# Patient Record
Sex: Female | Born: 1970 | Race: Black or African American | Hispanic: No | Marital: Single | Smoking: Current some day smoker
Health system: Southern US, Community
[De-identification: ages and names within clinical notes are randomized; demographics above are authoritative.]

## PROBLEM LIST (undated history)

## (undated) DIAGNOSIS — K219 Gastro-esophageal reflux disease without esophagitis: Secondary | ICD-10-CM

## (undated) DIAGNOSIS — D649 Anemia, unspecified: Secondary | ICD-10-CM

## (undated) DIAGNOSIS — N83201 Unspecified ovarian cyst, right side: Secondary | ICD-10-CM

## (undated) DIAGNOSIS — K220 Achalasia of cardia: Secondary | ICD-10-CM

## (undated) DIAGNOSIS — R109 Unspecified abdominal pain: Secondary | ICD-10-CM

## (undated) DIAGNOSIS — E079 Disorder of thyroid, unspecified: Secondary | ICD-10-CM

## (undated) DIAGNOSIS — I10 Essential (primary) hypertension: Secondary | ICD-10-CM

## (undated) DIAGNOSIS — N83202 Unspecified ovarian cyst, left side: Secondary | ICD-10-CM

## (undated) HISTORY — PX: TUBAL LIGATION: SHX77

---

## 2013-12-11 ENCOUNTER — Emergency Department (HOSPITAL_COMMUNITY)
Admission: EM | Admit: 2013-12-11 | Discharge: 2013-12-11 | Disposition: A | Discharge status: Home / Self Care | Payer: Self-pay | Attending: Emergency Medicine | Admitting: Emergency Medicine

## 2013-12-11 ENCOUNTER — Encounter (HOSPITAL_COMMUNITY): Payer: Self-pay | Admitting: Emergency Medicine

## 2013-12-11 ENCOUNTER — Emergency Department (HOSPITAL_COMMUNITY): Payer: Self-pay

## 2013-12-11 DIAGNOSIS — Y929 Unspecified place or not applicable: Secondary | ICD-10-CM | POA: Insufficient documentation

## 2013-12-11 DIAGNOSIS — S79919A Unspecified injury of unspecified hip, initial encounter: Secondary | ICD-10-CM | POA: Insufficient documentation

## 2013-12-11 DIAGNOSIS — S46909A Unspecified injury of unspecified muscle, fascia and tendon at shoulder and upper arm level, unspecified arm, initial encounter: Secondary | ICD-10-CM | POA: Insufficient documentation

## 2013-12-11 DIAGNOSIS — Y9389 Activity, other specified: Secondary | ICD-10-CM | POA: Insufficient documentation

## 2013-12-11 DIAGNOSIS — S4980XA Other specified injuries of shoulder and upper arm, unspecified arm, initial encounter: Secondary | ICD-10-CM | POA: Insufficient documentation

## 2013-12-11 DIAGNOSIS — M25511 Pain in right shoulder: Secondary | ICD-10-CM

## 2013-12-11 DIAGNOSIS — S0993XA Unspecified injury of face, initial encounter: Secondary | ICD-10-CM | POA: Insufficient documentation

## 2013-12-11 DIAGNOSIS — R110 Nausea: Secondary | ICD-10-CM | POA: Insufficient documentation

## 2013-12-11 DIAGNOSIS — W108XXA Fall (on) (from) other stairs and steps, initial encounter: Secondary | ICD-10-CM | POA: Insufficient documentation

## 2013-12-11 DIAGNOSIS — S8990XA Unspecified injury of unspecified lower leg, initial encounter: Secondary | ICD-10-CM | POA: Insufficient documentation

## 2013-12-11 DIAGNOSIS — S0990XA Unspecified injury of head, initial encounter: Secondary | ICD-10-CM | POA: Insufficient documentation

## 2013-12-11 DIAGNOSIS — R0789 Other chest pain: Secondary | ICD-10-CM

## 2013-12-11 DIAGNOSIS — S298XXA Other specified injuries of thorax, initial encounter: Secondary | ICD-10-CM | POA: Insufficient documentation

## 2013-12-11 DIAGNOSIS — R0602 Shortness of breath: Secondary | ICD-10-CM | POA: Insufficient documentation

## 2013-12-11 DIAGNOSIS — F17200 Nicotine dependence, unspecified, uncomplicated: Secondary | ICD-10-CM | POA: Insufficient documentation

## 2013-12-11 DIAGNOSIS — W1809XA Striking against other object with subsequent fall, initial encounter: Secondary | ICD-10-CM | POA: Insufficient documentation

## 2013-12-11 MED ORDER — DIAZEPAM 5 MG PO TABS: 5.0000 mg | ORAL_TABLET | Freq: Three times a day (TID) | ORAL | Status: DC | PRN

## 2013-12-11 MED ORDER — ONDANSETRON 8 MG PO TBDP
8.0000 mg | ORAL_TABLET | Freq: Once | ORAL | Status: AC
  Administered 2013-12-11: 8 mg via ORAL

## 2013-12-11 MED ORDER — ONDANSETRON 8 MG PO TBDP
4.0000 mg | ORAL_TABLET | Freq: Once | ORAL | Status: DC
  Filled 2013-12-11: qty 1

## 2013-12-11 MED ORDER — OXYCODONE-ACETAMINOPHEN 5-325 MG PO TABS
1.0000 | ORAL_TABLET | Freq: Once | ORAL | Status: AC
  Administered 2013-12-11: 1 via ORAL
  Filled 2013-12-11: qty 1

## 2013-12-11 MED ORDER — HYDROCODONE-ACETAMINOPHEN 5-325 MG PO TABS: 1.0000 | ORAL_TABLET | ORAL | Status: DC | PRN

## 2013-12-11 NOTE — ED Notes (Signed)
Pt ambulates without distress. Pt alert x4 respirations easy

## 2013-12-11 NOTE — Progress Notes (Signed)
[facility] provided pt with a list of primary care resources and [facility] information. Patient state that she was in the process of getting insurance through [employer] [facility].

## 2013-12-11 NOTE — ED Provider Notes (Signed)
CSN: [id number]     Arrival date & time 12/11/13  0102 History   First MD Initiated Contact with Patient 12/11/13 1016     Chief Complaint  Patient presents with  . Fall   (Consider location/radiation/quality/duration/timing/severity/associated sxs/prior Treatment) HPI Patient presents with pain down her right side after falling down 10 stairs.  States she was turned around talking to a friend and slipped, causing her to tumble/roll down the stairs. C/O pain in her right neck, shoulder, right ribs, right hip, right knee.  States the worst pain is in her upper body.  She does have mild pain in her head.  States she did hit her head but denies LOC.  Denies confusion following the event.  Does currently have nausea and mild SOB.  Denies abdominal pain, vomiting, visual changes, any wounds or bleeding.    History reviewed. No pertinent past medical history. History reviewed. No pertinent past surgical history. History reviewed. No pertinent family history. History  Substance Use Topics  . Smoking status: Current Some Day Smoker  . Smokeless tobacco: Not on file  . Alcohol Use: No   OB History   Grav Para Term Preterm Abortions TAB SAB Ect Mult Living                 Review of Systems  Eyes: Negative for visual disturbance.  Respiratory: Positive for shortness of breath.   Cardiovascular: Positive for chest pain.  Gastrointestinal: Negative for abdominal pain.  Musculoskeletal: Positive for neck pain. Negative for back pain.  Skin: Negative for wound.  Allergic/Immunologic: Negative for immunocompromised state.  Neurological: Positive for headaches. Negative for dizziness, syncope, weakness, light-headedness and numbness.  Hematological: Does not bruise/bleed easily.  Psychiatric/Behavioral: Negative for confusion.    Allergies  Review of patient's allergies indicates not on file.  Home Medications  No current outpatient prescriptions on file. BP 155/94  Pulse 89  Temp(Src)  99.1 F (37.3 C) (Oral)  Resp 25  SpO2 99% Physical Exam  Nursing note and vitals reviewed. Constitutional: She appears well-developed and well-nourished. No distress.  HENT:  Head: Normocephalic and atraumatic.  Neck: Neck supple.  Cardiovascular: Normal rate and regular rhythm.   Pulmonary/Chest: Effort normal and breath sounds normal. No respiratory distress. She has no wheezes. She has no rales. She exhibits tenderness.    Abdominal: Soft. She exhibits no distension. There is no tenderness. There is no rebound and no guarding.  Musculoskeletal:       Right shoulder: She exhibits decreased range of motion and tenderness. She exhibits no deformity and normal pulse.       Right elbow: Normal.      Right wrist: Normal.       Right knee: Tenderness found.       Cervical back: She exhibits no bony tenderness.       Thoracic back: She exhibits no bony tenderness.       Lumbar back: She exhibits no bony tenderness.       Arms:      Right hand: Normal.  Spine nontender, no crepitus, or stepoffs. Extremities:  Strength 5/5, sensation intact, distal pulses intact.    Gait is limping   Neurological: She is alert. She has normal strength. She is not disoriented. No cranial nerve deficit or sensory deficit. She exhibits normal muscle tone. GCS eye subscore is 4. GCS verbal subscore is 5. GCS motor subscore is 6.  Finger to nose is normal.   Skin: She is not diaphoretic.  ED Course  Procedures (including critical care time) Labs Review Labs Reviewed - No data to display Imaging Review Dg Ribs Unilateral W/chest Right  12/11/2013   CLINICAL DATA:  Fall downstairs.  Right anterior rib pain.  EXAM: RIGHT RIBS AND CHEST - 3+ VIEW  COMPARISON:  None.  FINDINGS: There is no pneumothorax. Cardiopericardial silhouette appears within normal limits. No displaced rib fractures are identified.  IMPRESSION: Negative.   Electronically Signed   By: Andreas Newport M.D.   On: 12/11/2013 11:19   Dg  Shoulder Right  12/11/2013   CLINICAL DATA:  Fall down stairs.  Right shoulder pain.  EXAM: RIGHT SHOULDER - 2+ VIEW  COMPARISON:  None.  FINDINGS: The right shoulder is located.  Type 2 acromion.  No fracture.  IMPRESSION: Negative.   Electronically Signed   By: Andreas Newport M.D.   On: 12/11/2013 11:20   Dg Knee Complete 4 Views Right  12/11/2013   CLINICAL DATA:  Right knee pain.  Fall down steps.  EXAM: RIGHT KNEE - COMPLETE 4+ VIEW  COMPARISON:  None.  FINDINGS: There is no evidence of fracture, dislocation, or joint effusion. There is no evidence of arthropathy or other focal bone abnormality. Soft tissues are unremarkable.  IMPRESSION: Negative.   Electronically Signed   By: Andreas Newport M.D.   On: 12/11/2013 11:20    EKG Interpretation   None       MDM   1. Fall down stairs, initial encounter   2. Right-sided chest wall pain   3. Right shoulder pain      Pt with mechanical fall down stairs 1.5 hrs PTA. She is ambulatory and drove herself to the ED.  Pain along the right side of her body.  Xrays negative for fracture.  Pt did hit her head but has no physical e/o head trauma and no LOC, neurologic exam limited due to patient's pain in right arm but otherwise grossly intact.  Doubt intracranial bleed.  Pt rechecked 2+ hrs after arrival without any changes in mental status or exam.  D/C home with norco, valium, PCP resources for follow up. Discussed result, findings, treatment, and follow up  with patient.  Pt given return precautions.  Pt verbalizes understanding and agrees with plan.        Trixie Dredge, PA-C 12/11/13 1208

## 2013-12-11 NOTE — ED Provider Notes (Signed)
Medical screening examination/treatment/procedure(s) were performed by non-physician practitioner and as supervising physician I was immediately available for consultation/collaboration.  EKG Interpretation   None         Gilda Crease, MD 12/11/13 1212

## 2013-12-11 NOTE — ED Notes (Signed)
Pt states she fell down 10 stairs this am after missing step. Pt has R head, neck, ribs and knee pain. Pt ambulatory after fall. Pt able to move arm. No LOC

## 2013-12-25 ENCOUNTER — Emergency Department (HOSPITAL_COMMUNITY): Payer: Self-pay

## 2013-12-25 ENCOUNTER — Encounter (HOSPITAL_COMMUNITY): Payer: Self-pay | Admitting: Emergency Medicine

## 2013-12-25 ENCOUNTER — Emergency Department (HOSPITAL_COMMUNITY)
Admission: EM | Admit: 2013-12-25 | Discharge: 2013-12-25 | Disposition: A | Discharge status: Home / Self Care | Payer: Self-pay | Attending: Emergency Medicine | Admitting: Emergency Medicine

## 2013-12-25 DIAGNOSIS — G8911 Acute pain due to trauma: Secondary | ICD-10-CM | POA: Insufficient documentation

## 2013-12-25 DIAGNOSIS — R0781 Pleurodynia: Secondary | ICD-10-CM

## 2013-12-25 DIAGNOSIS — F17200 Nicotine dependence, unspecified, uncomplicated: Secondary | ICD-10-CM | POA: Insufficient documentation

## 2013-12-25 DIAGNOSIS — R079 Chest pain, unspecified: Secondary | ICD-10-CM | POA: Insufficient documentation

## 2013-12-25 MED ORDER — PREDNISONE 50 MG PO TABS: 50.0000 mg | ORAL_TABLET | Freq: Every day | ORAL | Status: DC

## 2013-12-25 MED ORDER — HYDROCODONE-ACETAMINOPHEN 5-325 MG PO TABS: 1.0000 | ORAL_TABLET | ORAL | Status: DC | PRN

## 2013-12-25 MED ORDER — OXYCODONE-ACETAMINOPHEN 5-325 MG PO TABS
1.0000 | ORAL_TABLET | Freq: Once | ORAL | Status: AC
  Administered 2013-12-25: 1 via ORAL
  Filled 2013-12-25: qty 1

## 2013-12-25 MED ORDER — MORPHINE SULFATE 4 MG/ML IJ SOLN
6.0000 mg | Freq: Once | INTRAMUSCULAR | Status: AC
  Administered 2013-12-25: 6 mg via INTRAMUSCULAR
  Filled 2013-12-25: qty 2

## 2013-12-25 MED ORDER — OXYCODONE-ACETAMINOPHEN 5-325 MG PO TABS: 1.0000 | ORAL_TABLET | Freq: Four times a day (QID) | ORAL | Status: DC | PRN

## 2013-12-25 MED ORDER — ONDANSETRON 8 MG PO TBDP
8.0000 mg | ORAL_TABLET | Freq: Once | ORAL | Status: AC
  Administered 2013-12-25: 8 mg via ORAL
  Filled 2013-12-25: qty 1

## 2013-12-25 NOTE — ED Notes (Signed)
Pt c/o R rib R hip pain onset 12/18, pt states she fell down steps while at work 2 weeks ago. Pt states pain became worse tonight. Pt grimacing and grunting with pain

## 2013-12-25 NOTE — Discharge Instructions (Signed)
Take vicodin as prescribed for severe pain.  Do not drive within four hours of taking this medication (may cause drowsiness or confusion).    Ice 2-3 times a day for 15-20 minutes.  Avoid activities that aggravate pain.   Use incentive spirometer once an hour while your awake until the pain improves.   Return to the ER if your pain worsens or you develop difficulty breathing.

## 2013-12-25 NOTE — ED Provider Notes (Signed)
CSN: [id number]     Arrival date & time 12/25/13  0358 History   First MD Initiated Contact with Patient 12/25/13 [phone]     Chief Complaint  Patient presents with  . Rib Injury   (Consider location/radiation/quality/duration/timing/severity/associated sxs/prior Treatment) HPI History provided by pt.   Pt had a mechanical fall on the stairs 2 weeks ago and presented to the ED w/ right anterior rib pain as well as several other injuries.  Unilateral ribs w/ chest imaging unremarkable at that time.  Returns to the ED today because pain has gradually worsened since that time.  Pain is mild at rest and aggravated by movement and deep inspiration.  She was sitting on the cough this morning, made a quick movement and pain became sharp and severe.  No associated fever, cough, SOB, abd pain, N/V.  Has not had relief w/ ibuprofen.   Marland Kitchen.History reviewed. No pertinent past medical history. History reviewed. No pertinent past surgical history. No family history on file. History  Substance Use Topics  . Smoking status: Current Some Day Smoker  . Smokeless tobacco: Not on file  . Alcohol Use: No   OB History   Grav Para Term Preterm Abortions TAB SAB Ect Mult Living                 Review of Systems  All other systems reviewed and are negative.    Allergies  Bee venom; Sulfa antibiotics; and Naproxen  Home Medications   Current Outpatient Rx  Name  Route  Sig  Dispense  Refill  . diazepam (VALIUM) 5 MG tablet   Oral   Take 1 tablet (5 mg total) by mouth 3 (three) times daily as needed (muscle spasm or pain).   12 tablet   0   . HYDROcodone-acetaminophen (NORCO/VICODIN) 5-325 MG per tablet   Oral   Take 1 tablet by mouth every 4 (four) hours as needed for moderate pain or severe pain.   15 tablet   0   . ibuprofen (ADVIL,MOTRIN) 200 MG tablet   Oral   Take 400-800 mg by mouth every 6 (six) hours as needed (pain).         Marland Kitchen. HYDROcodone-acetaminophen (NORCO/VICODIN) 5-325 MG per  tablet   Oral   Take 1 tablet by mouth every 4 (four) hours as needed for moderate pain.   20 tablet   0    BP 152/104  Pulse 87  Temp(Src) 98.8 F (37.1 C) (Oral)  Resp 19  Ht 5\' 1"  (1.549 m)  Wt 145 lb (65.772 kg)  BMI 27.41 kg/m2  SpO2 100%  LMP 12/18/2013 Physical Exam  Nursing note and vitals reviewed. Constitutional: She is oriented to person, place, and time. She appears well-developed and well-nourished. No distress.  Pt appears uncomfortable w/ movement  HENT:  Head: Normocephalic and atraumatic.  Eyes:  Normal appearance  Neck: Normal range of motion.  Cardiovascular: Normal rate and regular rhythm.   Pulmonary/Chest: Effort normal and breath sounds normal. No respiratory distress.  Pt is holding her right lower anterior chest.  No edema or ecchymosis.   ttp w/ guarding.  Takes shallow respirations.  Abdominal: Soft. Bowel sounds are normal. She exhibits no distension.  Diffuse ttp w/ guarding   Musculoskeletal: Normal range of motion.  Neurological: She is alert and oriented to person, place, and time.  Skin: Skin is warm and dry. No rash noted.  Psychiatric: She has a normal mood and affect. Her behavior is normal.  ED Course  Procedures (including critical care time) Labs Review Labs Reviewed - No data to display Imaging Review No results found.  EKG Interpretation   None       MDM   1. Rib pain on right side    Healhty 43yo F presents w/ gradually worsening right anterior rib pain s/p mechanical fall on stairs 2 weeks ago. Unilateral ribs w/ chest imaging negative on 12/07/13.  Pt appears uncomfortable, no visible injury to chest, rib ttp on exam.  Repeat xray pending.  Lawyer, PA-C to resume care.  Pt has received percocet for pain.     Otilio Miu, PA-C 12/25/13 [phone]

## 2013-12-25 NOTE — ED Provider Notes (Signed)
Medical screening examination/treatment/procedure(s) were performed by non-physician practitioner and as supervising physician I was immediately available for consultation/collaboration.   Denaya Horn, MD 12/25/13 0811 

## 2014-01-11 ENCOUNTER — Emergency Department (HOSPITAL_COMMUNITY): Payer: Self-pay

## 2014-01-11 ENCOUNTER — Emergency Department (HOSPITAL_COMMUNITY)
Admission: EM | Admit: 2014-01-11 | Discharge: 2014-01-11 | Disposition: A | Discharge status: Home / Self Care | Payer: Self-pay | Attending: Emergency Medicine | Admitting: Emergency Medicine

## 2014-01-11 DIAGNOSIS — R0602 Shortness of breath: Secondary | ICD-10-CM | POA: Insufficient documentation

## 2014-01-11 DIAGNOSIS — Y939 Activity, unspecified: Secondary | ICD-10-CM | POA: Insufficient documentation

## 2014-01-11 DIAGNOSIS — S20219A Contusion of unspecified front wall of thorax, initial encounter: Secondary | ICD-10-CM | POA: Insufficient documentation

## 2014-01-11 DIAGNOSIS — IMO0002 Reserved for concepts with insufficient information to code with codable children: Secondary | ICD-10-CM | POA: Insufficient documentation

## 2014-01-11 DIAGNOSIS — F17200 Nicotine dependence, unspecified, uncomplicated: Secondary | ICD-10-CM | POA: Insufficient documentation

## 2014-01-11 DIAGNOSIS — Y929 Unspecified place or not applicable: Secondary | ICD-10-CM | POA: Insufficient documentation

## 2014-01-11 MED ORDER — MORPHINE SULFATE 4 MG/ML IJ SOLN
4.0000 mg | Freq: Once | INTRAMUSCULAR | Status: AC
  Administered 2014-01-11: 4 mg via INTRAVENOUS
  Filled 2014-01-11: qty 1

## 2014-01-11 MED ORDER — HYDROCODONE-ACETAMINOPHEN 5-325 MG PO TABS: 1.0000 | ORAL_TABLET | ORAL | Status: DC | PRN
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
## 2014-01-11 NOTE — Discharge Instructions (Signed)
Take vicodin as prescribed for severe pain.  Do not drive within four hours of taking this medication (may cause drowsiness or confusion).   Take ibuprofen as well; up to 800mg  three times a day with food.   Use incentive spirometer once an hour while your awake until the pain improves.   Return to the ER if you pain worsens or you develop increased difficulty breathing.
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
## 2014-01-11 NOTE — ED Notes (Signed)
Patient transported to X-ray 

## 2014-01-11 NOTE — ED Notes (Signed)
Gave pt incentive spirometer and instructed pt how to use it and the importance on why to use it.

## 2014-01-12 NOTE — ED Provider Notes (Signed)
See separate note, I agree with plan by PA.  Enid SkeensZAVITZ, Ameena Vesey M   Enid SkeensJoshua M Cornesha Radziewicz, MD 01/12/14 [phone]

## 2014-01-23 ENCOUNTER — Emergency Department (HOSPITAL_COMMUNITY): Payer: Self-pay

## 2014-01-23 ENCOUNTER — Inpatient Hospital Stay (HOSPITAL_COMMUNITY)
Admission: EM | Admit: 2014-01-23 | Discharge: 2014-01-27 | DRG: 758 | Disposition: A | Discharge status: Home / Self Care | Payer: Self-pay | Attending: Internal Medicine | Admitting: Internal Medicine

## 2014-01-23 ENCOUNTER — Encounter (HOSPITAL_COMMUNITY): Payer: Self-pay | Admitting: Emergency Medicine

## 2014-01-23 DIAGNOSIS — N920 Excessive and frequent menstruation with regular cycle: Secondary | ICD-10-CM | POA: Diagnosis present

## 2014-01-23 DIAGNOSIS — Z888 Allergy status to other drugs, medicaments and biological substances status: Secondary | ICD-10-CM

## 2014-01-23 DIAGNOSIS — N739 Female pelvic inflammatory disease, unspecified: Principal | ICD-10-CM | POA: Diagnosis present

## 2014-01-23 DIAGNOSIS — Z833 Family history of diabetes mellitus: Secondary | ICD-10-CM

## 2014-01-23 DIAGNOSIS — E876 Hypokalemia: Secondary | ICD-10-CM | POA: Diagnosis present

## 2014-01-23 DIAGNOSIS — E872 Acidosis, unspecified: Secondary | ICD-10-CM | POA: Diagnosis present

## 2014-01-23 DIAGNOSIS — N83209 Unspecified ovarian cyst, unspecified side: Secondary | ICD-10-CM | POA: Diagnosis present

## 2014-01-23 DIAGNOSIS — E039 Hypothyroidism, unspecified: Secondary | ICD-10-CM

## 2014-01-23 DIAGNOSIS — Z882 Allergy status to sulfonamides status: Secondary | ICD-10-CM

## 2014-01-23 DIAGNOSIS — K219 Gastro-esophageal reflux disease without esophagitis: Secondary | ICD-10-CM | POA: Diagnosis present

## 2014-01-23 DIAGNOSIS — Z91038 Other insect allergy status: Secondary | ICD-10-CM

## 2014-01-23 DIAGNOSIS — E860 Dehydration: Secondary | ICD-10-CM

## 2014-01-23 DIAGNOSIS — R112 Nausea with vomiting, unspecified: Secondary | ICD-10-CM

## 2014-01-23 DIAGNOSIS — F17200 Nicotine dependence, unspecified, uncomplicated: Secondary | ICD-10-CM | POA: Diagnosis present

## 2014-01-23 DIAGNOSIS — R109 Unspecified abdominal pain: Secondary | ICD-10-CM

## 2014-01-23 DIAGNOSIS — D649 Anemia, unspecified: Secondary | ICD-10-CM | POA: Diagnosis present

## 2014-01-23 DIAGNOSIS — N730 Acute parametritis and pelvic cellulitis: Secondary | ICD-10-CM | POA: Diagnosis present

## 2014-01-23 DIAGNOSIS — D509 Iron deficiency anemia, unspecified: Secondary | ICD-10-CM | POA: Diagnosis present

## 2014-01-23 LAB — COMPREHENSIVE METABOLIC PANEL
ALBUMIN: 4 g/dL (ref 3.5–5.2)
ALT: 13 U/L (ref 0–35)
AST: 22 U/L (ref 0–37)
Alkaline Phosphatase: 78 U/L (ref 39–117)
BUN: 11 mg/dL (ref 6–23)
CALCIUM: 9.3 mg/dL (ref 8.4–10.5)
CO2: 16 mEq/L — ABNORMAL LOW (ref 19–32)
CREATININE: 0.71 mg/dL (ref 0.50–1.10)
Chloride: 102 mEq/L (ref 96–112)
GFR calc non Af Amer: >90 mL/min (ref >90)
GLUCOSE: 137 mg/dL — AB (ref 70–99)
Potassium: 3.6 mEq/L — ABNORMAL LOW (ref 3.7–5.3)
Sodium: 139 mEq/L (ref 137–147)
TOTAL PROTEIN: 7.8 g/dL (ref 6.0–8.3)
Total Bilirubin: 0.4 mg/dL (ref 0.3–1.2)

## 2014-01-23 LAB — RETICULOCYTES
RBC.: 3.85 MIL/uL — ABNORMAL LOW (ref 3.87–5.11)
Retic Count, Absolute: 42.4 10*3/uL (ref 19.0–186.0)
Retic Ct Pct: 1.1 % (ref 0.4–3.1)

## 2014-01-23 LAB — CBC WITH DIFFERENTIAL/PLATELET
BASOS PCT: 0 % (ref 0–1)
BASOS PCT: 0 % (ref 0–1)
Basophils Absolute: 0 10*3/uL (ref 0.0–0.1)
Basophils Absolute: 0 10*3/uL (ref 0.0–0.1)
EOS PCT: 1 % (ref 0–5)
Eosinophils Absolute: 0.1 10*3/uL (ref 0.0–0.7)
Eosinophils Absolute: 0 10*3/uL (ref 0.0–0.7)
Eosinophils Relative: 0 % (ref 0–5)
HCT: 35.9 % — ABNORMAL LOW (ref 36.0–46.0)
HEMATOCRIT: 31.6 % — AB (ref 36.0–46.0)
HEMOGLOBIN: 11.7 g/dL — AB (ref 12.0–15.0)
Hemoglobin: 10 g/dL — ABNORMAL LOW (ref 12.0–15.0)
LYMPHS ABS: 2.5 10*3/uL (ref 0.7–4.0)
LYMPHS PCT: 13 % (ref 12–46)
Lymphocytes Relative: 18 % (ref 12–46)
Lymphs Abs: 1.5 10*3/uL (ref 0.7–4.0)
MCH: 25.3 pg — ABNORMAL LOW (ref 26.0–34.0)
MCH: 24.9 pg — ABNORMAL LOW (ref 26.0–34.0)
MCHC: 32.6 g/dL (ref 30.0–36.0)
MCHC: 31.6 g/dL (ref 30.0–36.0)
MCV: 77.7 fL — AB (ref 78.0–100.0)
MCV: 78.8 fL (ref 78.0–100.0)
MONO ABS: 0.4 10*3/uL (ref 0.1–1.0)
MONOS PCT: 5 % (ref 3–12)
Monocytes Absolute: 0.7 10*3/uL (ref 0.1–1.0)
Monocytes Relative: 3 % (ref 3–12)
NEUTROS PCT: 77 % (ref 43–77)
NEUTROS PCT: 83 % — AB (ref 43–77)
Neutro Abs: 11 10*3/uL — ABNORMAL HIGH (ref 1.7–7.7)
Neutro Abs: 9.7 10*3/uL — ABNORMAL HIGH (ref 1.7–7.7)
PLATELETS: 265 10*3/uL (ref 150–400)
Platelets: 325 10*3/uL (ref 150–400)
RBC: 4.62 MIL/uL (ref 3.87–5.11)
RBC: 4.01 MIL/uL (ref 3.87–5.11)
RDW: 17.6 % — ABNORMAL HIGH (ref 11.5–15.5)
RDW: 17.6 % — AB (ref 11.5–15.5)
WBC: 14.3 10*3/uL — ABNORMAL HIGH (ref 4.0–10.5)
WBC: 11.7 10*3/uL — AB (ref 4.0–10.5)

## 2014-01-23 LAB — PROTIME-INR
INR: 1.07 (ref 0.00–1.49)
Prothrombin Time: 13.7 seconds (ref 11.6–15.2)

## 2014-01-23 LAB — URINALYSIS, ROUTINE W REFLEX MICROSCOPIC
Bilirubin Urine: NEGATIVE
GLUCOSE, UA: NEGATIVE mg/dL
Hgb urine dipstick: NEGATIVE
Ketones, ur: 40 mg/dL — AB
LEUKOCYTES UA: NEGATIVE
Nitrite: NEGATIVE
PH: 5.5 (ref 5.0–8.0)
Protein, ur: NEGATIVE mg/dL
Specific Gravity, Urine: 1.021 (ref 1.005–1.030)
Urobilinogen, UA: 0.2 mg/dL (ref 0.0–1.0)

## 2014-01-23 LAB — IRON AND TIBC
IRON: 24 ug/dL — AB (ref 42–135)
Saturation Ratios: 6 % — ABNORMAL LOW (ref 20–55)
TIBC: 430 ug/dL (ref 250–470)
UIBC: 406 ug/dL — AB (ref 125–400)

## 2014-01-23 LAB — VITAMIN B12: Vitamin B-12: 659 pg/mL (ref 211–911)

## 2014-01-23 LAB — TSH: TSH: 20.225 u[IU]/mL — ABNORMAL HIGH (ref 0.350–4.500)

## 2014-01-23 LAB — POCT PREGNANCY, URINE: PREG TEST UR: NEGATIVE

## 2014-01-23 LAB — WET PREP, GENITAL
Clue Cells Wet Prep HPF POC: NONE SEEN
TRICH WET PREP: NONE SEEN
WBC WET PREP: NONE SEEN
YEAST WET PREP: NONE SEEN

## 2014-01-23 LAB — FOLATE: Folate: 15.2 ng/mL

## 2014-01-23 LAB — FERRITIN: FERRITIN: 20 ng/mL (ref 10–291)

## 2014-01-23 LAB — CG4 I-STAT (LACTIC ACID): LACTIC ACID, VENOUS: 2.29 mmol/L — AB (ref 0.5–2.2)

## 2014-01-23 LAB — GC/CHLAMYDIA PROBE AMP
CT Probe RNA: NEGATIVE
GC Probe RNA: NEGATIVE

## 2014-01-23 LAB — T4, FREE: Free T4: 0.66 ng/dL — ABNORMAL LOW (ref 0.80–1.80)

## 2014-01-23 LAB — LIPASE, BLOOD: LIPASE: 29 U/L (ref 11–59)

## 2014-01-23 MED ORDER — HYDROMORPHONE HCL PF 1 MG/ML IJ SOLN
1.0000 mg | INTRAMUSCULAR | Status: DC | PRN
  Administered 2014-01-23 (×2): 1 mg via INTRAVENOUS
  Filled 2014-01-23 (×2): qty 1

## 2014-01-23 MED ORDER — HYDROMORPHONE HCL PF 1 MG/ML IJ SOLN
1.0000 mg | Freq: Once | INTRAMUSCULAR | Status: AC
  Administered 2014-01-23: 1 mg via INTRAVENOUS
  Filled 2014-01-23: qty 1

## 2014-01-23 MED ORDER — DEXTROSE 5 % IV SOLN
1.0000 g | Freq: Once | INTRAVENOUS | Status: AC
  Administered 2014-01-23: 1 g via INTRAVENOUS
  Filled 2014-01-23: qty 10

## 2014-01-23 MED ORDER — ONDANSETRON HCL 4 MG/2ML IJ SOLN
4.0000 mg | Freq: Four times a day (QID) | INTRAMUSCULAR | Status: DC | PRN
  Administered 2014-01-23: 4 mg via INTRAVENOUS
  Filled 2014-01-23: qty 2

## 2014-01-23 MED ORDER — ONDANSETRON HCL 4 MG/2ML IJ SOLN
4.0000 mg | Freq: Once | INTRAMUSCULAR | Status: AC
  Administered 2014-01-23: 4 mg via INTRAVENOUS
  Filled 2014-01-23: qty 2

## 2014-01-23 MED ORDER — SODIUM CHLORIDE 0.9 % IV BOLUS (SEPSIS)
1000.0000 mL | Freq: Once | INTRAVENOUS | Status: AC
  Administered 2014-01-23: 1000 mL via INTRAVENOUS

## 2014-01-23 MED ORDER — DEXTROSE-NACL 5-0.9 % IV SOLN
INTRAVENOUS | Status: DC
  Administered 2014-01-23 – 2014-01-26 (×5): via INTRAVENOUS

## 2014-01-23 MED ORDER — HEPARIN SODIUM (PORCINE) 5000 UNIT/ML IJ SOLN
5000.0000 [IU] | Freq: Three times a day (TID) | INTRAMUSCULAR | Status: DC
Start: 2014-01-23 — End: 2014-01-23

## 2014-01-23 MED ORDER — DOXYCYCLINE HYCLATE 100 MG IV SOLR
100.0000 mg | Freq: Two times a day (BID) | INTRAVENOUS | Status: DC
  Administered 2014-01-23 – 2014-01-25 (×6): 100 mg via INTRAVENOUS
  Filled 2014-01-23 (×8): qty 100

## 2014-01-23 MED ORDER — ONDANSETRON HCL 4 MG/2ML IJ SOLN: 4.0000 mg | Freq: Four times a day (QID) | INTRAMUSCULAR | Status: AC | PRN

## 2014-01-23 MED ORDER — MORPHINE SULFATE 2 MG/ML IJ SOLN
2.0000 mg | INTRAMUSCULAR | Status: DC | PRN
  Administered 2014-01-23 – 2014-01-26 (×32): 2 mg via INTRAVENOUS
  Filled 2014-01-23 (×32): qty 1

## 2014-01-23 MED ORDER — HEPARIN SODIUM (PORCINE) 5000 UNIT/ML IJ SOLN
5000.0000 [IU] | Freq: Three times a day (TID) | INTRAMUSCULAR | Status: DC
  Administered 2014-01-23 – 2014-01-27 (×12): 5000 [IU] via SUBCUTANEOUS
  Filled 2014-01-23 (×17): qty 1

## 2014-01-23 MED ORDER — HYDROMORPHONE HCL PF 1 MG/ML IJ SOLN
0.5000 mg | Freq: Once | INTRAMUSCULAR | Status: AC
  Administered 2014-01-23: 0.5 mg via INTRAVENOUS
  Filled 2014-01-23: qty 1

## 2014-01-23 MED ORDER — POTASSIUM CHLORIDE 10 MEQ/100ML IV SOLN
10.0000 meq | INTRAVENOUS | Status: AC
  Administered 2014-01-23 (×2): 10 meq via INTRAVENOUS
  Filled 2014-01-23 (×2): qty 100

## 2014-01-23 MED ORDER — PANTOPRAZOLE SODIUM 40 MG IV SOLR
40.0000 mg | INTRAVENOUS | Status: DC
  Administered 2014-01-23 – 2014-01-25 (×3): 40 mg via INTRAVENOUS
  Filled 2014-01-23 (×5): qty 40

## 2014-01-23 MED ORDER — DEXTROSE 5 % IV SOLN
1.0000 g | Freq: Three times a day (TID) | INTRAVENOUS | Status: DC
  Administered 2014-01-23 – 2014-01-25 (×7): 1 g via INTRAVENOUS
  Filled 2014-01-23 (×9): qty 1

## 2014-01-23 NOTE — ED Notes (Signed)
Pt attempted to give urine sample but was unable to provide at this time. Pt started on a bolus due to nausea.

## 2014-01-23 NOTE — H&P (Signed)
Hospital Admission Note Date: 01/23/2014  Patient name: Annette Anderson Medical record number: [id number] Date of birth: [birth year] Age: 43 y.o. Gender: female PCP: No PCP Per Patient  Medical Service: IMTS  Attending physician: Dr. Meredith Pel  Internal Medicine Teaching Service Contact Information  Weekday Hours (7AM-5PM):   1st Contact:  Roxy Cedar (Medical Student): Pgr: [phone]  1st contact:  Dr. Otis Brace       pgr:  [phone]  2nd contact:  Dr. Darden Palmer    pgr:  [phone]  ** If no return call within 15 minutes (after trying both pagers listed above), please call after hours pagers.   After 5 pm or weekends: 1st Contact: Pager: [phone] 2nd Contact: Pager: [phone]   Chief Complaint: Abdominal pain  History of Present Illness:  Annette Anderson is an 43 y.o. female with past medical history of achalasia (S/P of surgery) and anemia, who presents with abdominal pain, nausea, vomiting and diarrhea.  Patient reports that she started having sudden onset abdominal pain when she was driving a car 2 days ago. The abdominal pain is mainly located at right lower quadrant, sharp, constant, radiating to the back, 10 out of 10 in severity. It is associated with low grade fever, nausea and vomiting. She vomited twice without blood in the vomitus. She also had diarrhea on the day when her abdominal pain started. She had at least 10 bowel movements with watery stool on the first day. She took Imodium twice, her diarrhea has almost resolved on the second day. Currently she does not have diarrhea, but still has nausea and abdominal pain. Patient took ibuprofen q4h without help. She also reports having dysuria and increased urinary frequency, but no burning sensation on urination. Patient is sexually active with her fianc, reports using condom. She has chronic menstrual period induced abdominal pain, which she thinks is completely different with this pain. Patient has regular, but heavy  menstrual period . No vaginal discharge or dyspareunia.   Patient was found to have negative UA in ED. CT-abd without contrast showed no hydronephrosis, kidney stone, diverticulitis or colitis, appendix did not visualize well, with some right adnexal stranding and some small amount of free fluid, no free air or lymphadenopathy.  Trans abdominal and transvaginal US showed nonspecific soft tissue prominence around the right ovary suggesting inflammation or infection. GYN and surgery were consulted by ED. GYN suspects PID w/ CMT. Surgeon thinks that it is likely diagnosis of ruptured hemorrhagic ovarian cyst.  ROS:  Has fever, nausea, abdominal pain and dysuria; No fatigue, headaches, cough, chest pain, SOB, constipation, hematuria, joint pain or leg swelling.  Meds: Current Outpatient Rx  Name  Route  Sig  Dispense  Refill  . Cyanocobalamin (VITAMIN B 12 PO)   Oral   Take 1 tablet by mouth daily.         Marland Kitchen HYDROcodone-acetaminophen (NORCO/VICODIN) 5-325 MG per tablet   Oral   Take 1 tablet by mouth every 4 (four) hours as needed for moderate pain.   12 tablet   0   . ibuprofen (ADVIL,MOTRIN) 200 MG tablet   Oral   Take 400-800 mg by mouth every 6 (six) hours as needed for moderate pain.            Allergies: Allergies as of 01/23/2014 - Review Complete 01/23/2014  Allergen Reaction Noted  . Bee venom Anaphylaxis 12/11/2013  . Sulfa antibiotics Anaphylaxis and Hives 12/11/2013  . Naproxen Hives 12/11/2013   History reviewed. No pertinent past medical history.  Past Surgical History  Procedure Laterality Date  . Cesarean section     History reviewed. No pertinent family history. History   Social History  . Marital Status: Single    Spouse Name: N/A    Number of Children: N/A  . Years of Education: N/A   Occupational History  . Not on file.   Social History Main Topics  . Smoking status: Current Some Day Smoker  . Smokeless tobacco: Not on file  . Alcohol Use: Yes   . Drug Use: No  . Sexual Activity: Not on file   Other Topics Concern  . Not on file   Social History Narrative  . No narrative on file    Review of Systems: Full 14-point review of systems otherwise negative except as noted above in HPI. Physical Exam:   Filed Vitals:   01/23/14 1030 01/23/14 1045 01/23/14 1100 01/23/14 1115  BP: 116/72 129/66 133/80 122/74  Pulse: 87 87 92 95  Temp:      TempSrc:      Resp:      SpO2: 99% 99% 99% 100%    General: Not in acute distress HEENT: PERRL, EOMI, no scleral icterus, No JVD or bruit Cardiac: S1/S2, RRR, No murmurs, gallops or rubs Pulm: Good air movement bilaterally. Clear to auscultation bilaterally. No rales, wheezing, rhonchi or rubs. Abd: Tender diffusely, worse on the RLQ, there is guarding, No rebound pain, no organomegaly, BS present. No CVA tenderness (when tapped over R CVA, patient had abdomen pain).  Ext: No edema. 2+DP/PT pulse bilaterally Musculoskeletal: No joint deformities, erythema, or stiffness, ROM full Skin: No rashes.  Neuro: Alert and oriented X3, cranial nerves II-XII grossly intact, muscle strength 5/5 in all extremeties, sensation to light touch intact. Brachial reflex 2+ bilaterally. Knee reflex 1+ bilaterally. Negative Babinski's sign. Normal finger to nose test. Psych: Patient is not psychotic, no suicidal or hemocidal ideation.  Lab results: Basic Metabolic Panel:  Recent Labs  03/47/4201/30/15 0419  NA 139  K 3.6*  CL 102  CO2 16*  GLUCOSE 137*  BUN 11  CREATININE 0.71  CALCIUM 9.3   Liver Function Tests:  Recent Labs  01/23/14 0419  AST 22  ALT 13  ALKPHOS 78  BILITOT 0.4  PROT 7.8  ALBUMIN 4.0   No results found for this basename: LIPASE, AMYLASE,  in the last 72 hours No results found for this basename: AMMONIA,  in the last 72 hours CBC:  Recent Labs  01/23/14 0419 01/23/14 1059  WBC 14.3* 11.7*  NEUTROABS 11.0* 9.7*  HGB 11.7* 10.0*  HCT 35.9* 31.6*  MCV 77.7* 78.8  PLT  325 265   Cardiac Enzymes: No results found for this basename: CKTOTAL, CKMB, CKMBINDEX, TROPONINI,  in the last 72 hours BNP: No results found for this basename: PROBNP,  in the last 72 hours D-Dimer: No results found for this basename: DDIMER,  in the last 72 hours CBG: No results found for this basename: GLUCAP,  in the last 72 hours Hemoglobin A1C: No results found for this basename: HGBA1C,  in the last 72 hours Fasting Lipid Panel: No results found for this basename: CHOL, HDL, LDLCALC, TRIG, CHOLHDL, LDLDIRECT,  in the last 72 hours Thyroid Function Tests: No results found for this basename: TSH, T4TOTAL, FREET4, T3FREE, THYROIDAB,  in the last 72 hours Anemia Panel: No results found for this basename: VITAMINB12, FOLATE, FERRITIN, TIBC, IRON, RETICCTPCT,  in the last 72 hours Coagulation: No results found for this basename:  LABPROT, INR,  in the last 72 hours Urine Drug Screen: Drugs of Abuse  No results found for this basename: labopia,  cocainscrnur,  labbenz,  amphetmu,  thcu,  labbarb    Alcohol Level: No results found for this basename: ETH,  in the last 72 hours Urinalysis:  Recent Labs  01/23/14 0533  COLORURINE YELLOW  LABSPEC 1.021  PHURINE 5.5  GLUCOSEU NEGATIVE  HGBUR NEGATIVE  BILIRUBINUR NEGATIVE  KETONESUR 40*  PROTEINUR NEGATIVE  UROBILINOGEN 0.2  NITRITE NEGATIVE  LEUKOCYTESUR NEGATIVE   Misc. Labs: Imaging results:  Ct Abdomen Pelvis Wo Contrast  01/23/2014   CLINICAL DATA:  Right flank pain  EXAM: CT ABDOMEN AND PELVIS WITHOUT CONTRAST  TECHNIQUE: Multidetector CT imaging of the abdomen and pelvis was performed following the standard protocol without intravenous contrast.  COMPARISON:  None.  FINDINGS: Linear right middle lobe opacity is favored to reflect atelectasis or scarring. Heart size within normal limits. Distal esophageal wall thickening and small hiatal hernia.  Organ evaluation is limited without intravenous contrast. Within this  limitation, no appreciable abnormality of the liver, biliary system, pancreas, spleen, adrenal glands.  Symmetric renal size. No hydroureteronephrosis no definite urinary tract calculi. Unable to follow the ureteral course in their entirety given the decompressed state.  Colonic diverticulosis. No CT evidence for colitis or diverticulitis. Appendix not identified. Small bowel loops are of normal course and caliber. No free intraperitoneal air. No lymphadenopathy.  Normal caliber aorta and branch vessels.  Partially decompressed bladder. Nonspecific appearance to the uterus and adnexa. There is mild stranding in the right adnexa and small amount of associated free fluid.  No acute osseous finding.  IMPRESSION: No hydronephrosis or urinary tract calculi identified.  Appendix not identified.  Mild right adnexal stranding/fluid may be physiologic. Correlate with pelvic ultrasound if concerned for acute pathology.  Distal esophageal wall thickening may reflect esophagitis or occurred.   Electronically Signed   By: Jearld Lesch M.D.   On: 01/23/2014 06:24   US Transvaginal Non-ob  01/23/2014   CLINICAL DATA:  Severe right adnexal pain and tenderness.  EXAM: TRANSABDOMINAL AND TRANSVAGINAL ULTRASOUND OF PELVIS  DOPPLER ULTRASOUND OF OVARIES  TECHNIQUE: Both transabdominal and transvaginal ultrasound examinations of the pelvis were performed. Transabdominal technique was performed for global imaging of the pelvis including uterus, ovaries, adnexal regions, and pelvic cul-de-sac.  It was necessary to proceed with endovaginal exam following the transabdominal exam to visualize the ovaries and adnexa. Color and duplex Doppler ultrasound was utilized to evaluate blood flow to the ovaries.  COMPARISON:  CT scan dated 01/23/2014  FINDINGS: Uterus  Measurements: 8.2 x 3.7 x 5.5 cm. No fibroids or other mass visualized.  Endometrium  Thickness: 10.7 mm. 3 mm cystic area in the endometrium of unknown etiology. The patient has  a negative pregnancy test.  Right ovary  Measurements: 3.4 x 2.2 x 2.7 cm. The adnexal tissues around the right ovary are slightly prominent. There is normal arterial and venous perfusion to the right ovary. The patient was quite tender in the right adnexal region.  Left ovary  Measurements: 2.1 x 1.3 x 1.7 cm. Normal appearance/no adnexal mass. There is normal arterial and venous perfusion to the left ovary.  Pulsed Doppler evaluation of both ovaries demonstrates normal low-resistance arterial and venous waveforms.  Other findings  Small amount of free fluid in the pelvis for.  IMPRESSION: Nonspecific soft tissue prominence around the right ovary suggesting inflammation or infection. Normal perfusion to the ovaries. Small amount of  free fluid in the pelvis.   Electronically Signed   By: Geanie Cooley M.D.   On: 01/23/2014 10:42   US Pelvis Complete  01/23/2014   CLINICAL DATA:  Severe right adnexal pain and tenderness.  EXAM: TRANSABDOMINAL AND TRANSVAGINAL ULTRASOUND OF PELVIS  DOPPLER ULTRASOUND OF OVARIES  TECHNIQUE: Both transabdominal and transvaginal ultrasound examinations of the pelvis were performed. Transabdominal technique was performed for global imaging of the pelvis including uterus, ovaries, adnexal regions, and pelvic cul-de-sac.  It was necessary to proceed with endovaginal exam following the transabdominal exam to visualize the ovaries and adnexa. Color and duplex Doppler ultrasound was utilized to evaluate blood flow to the ovaries.  COMPARISON:  CT scan dated 01/23/2014  FINDINGS: Uterus  Measurements: 8.2 x 3.7 x 5.5 cm. No fibroids or other mass visualized.  Endometrium  Thickness: 10.7 mm. 3 mm cystic area in the endometrium of unknown etiology. The patient has a negative pregnancy test.  Right ovary  Measurements: 3.4 x 2.2 x 2.7 cm. The adnexal tissues around the right ovary are slightly prominent. There is normal arterial and venous perfusion to the right ovary. The patient was quite  tender in the right adnexal region.  Left ovary  Measurements: 2.1 x 1.3 x 1.7 cm. Normal appearance/no adnexal mass. There is normal arterial and venous perfusion to the left ovary.  Pulsed Doppler evaluation of both ovaries demonstrates normal low-resistance arterial and venous waveforms.  Other findings  Small amount of free fluid in the pelvis for.  IMPRESSION: Nonspecific soft tissue prominence around the right ovary suggesting inflammation or infection. Normal perfusion to the ovaries. Small amount of free fluid in the pelvis.   Electronically Signed   By: Geanie Cooley M.D.   On: 01/23/2014 10:42   [place] Art/ven Flow Abd Pelv Doppler  01/23/2014   CLINICAL DATA:  Severe right adnexal pain and tenderness.  EXAM: TRANSABDOMINAL AND TRANSVAGINAL ULTRASOUND OF PELVIS  DOPPLER ULTRASOUND OF OVARIES  TECHNIQUE: Both transabdominal and transvaginal ultrasound examinations of the pelvis were performed. Transabdominal technique was performed for global imaging of the pelvis including uterus, ovaries, adnexal regions, and pelvic cul-de-sac.  It was necessary to proceed with endovaginal exam following the transabdominal exam to visualize the ovaries and adnexa. Color and duplex Doppler ultrasound was utilized to evaluate blood flow to the ovaries.  COMPARISON:  CT scan dated 01/23/2014  FINDINGS: Uterus  Measurements: 8.2 x 3.7 x 5.5 cm. No fibroids or other mass visualized.  Endometrium  Thickness: 10.7 mm. 3 mm cystic area in the endometrium of unknown etiology. The patient has a negative pregnancy test.  Right ovary  Measurements: 3.4 x 2.2 x 2.7 cm. The adnexal tissues around the right ovary are slightly prominent. There is normal arterial and venous perfusion to the right ovary. The patient was quite tender in the right adnexal region.  Left ovary  Measurements: 2.1 x 1.3 x 1.7 cm. Normal appearance/no adnexal mass. There is normal arterial and venous perfusion to the left ovary.  Pulsed Doppler evaluation of both  ovaries demonstrates normal low-resistance arterial and venous waveforms.  Other findings  Small amount of free fluid in the pelvis for.  IMPRESSION: Nonspecific soft tissue prominence around the right ovary suggesting inflammation or infection. Normal perfusion to the ovaries. Small amount of free fluid in the pelvis.   Electronically Signed   By: Geanie Cooley M.D.   On: 01/23/2014 10:42    Other results: Assessment & Plan by Problem:  #: Abdominal pain:  Etiology is not clear now. Differential diagnoses include ruptured ovarian cyst per surgeon and PID per GYN. Appendicitis cannot be completely ruled out at this moment. Another potential differential diagnosis is gastroenteritis given her severe diarrhea on the first today, which resolved after taking Imodium. Patient did take antibiotics recently, less likely due to C. difficile. Ceftriaxone and doxycycline were started the ED, and were recommended to continue by GYN, Dr. Jearld Lesch. Currently patient is hemodynamically stable. No sepsis.   - Will admit to med-surg bed - Appreciate GYN and surgeon's consultation in management of our patient - npo  - continue iv antibiotics, given possible PID, will switch ceftriaxone to cefoxitin for better anaerobic coverage and continue doxycycline  - IVF D5-NS 125 cc/h - Symptomatic treatment: Zofran for nausea, morphine for pain - blood culture X 2 (not drawn before anbx) - TSH and Free T4 per Dr. Jearld Lesch - check GC/Chlamydia, INR - will not order stool work up or C diff since she dose not have diarrhea now. May consider stool pathogen panel if not improve and has diarrhea again.  #: Anemia: Hemoglobin 10.0.  Patient has heavy menstrual period which may partially explain her anemia.  -will check anemia panel.  #: GERD: has hx of achalasia, S/P of Surgery. Currently no dysphagia. She is on Prilosec at home -Protonix 40 mg daily  #: Elevated AG: AG=21. It is likely due to elevated lactic acid level (2.29) and  starvation given her positive ketone in urine.  -IVF: d5-NS 125/h -repeat lactic acid in AM  #  F/E/N  -d5-ns: 125 cc/hr -electrolytes: K=3.6-->20 mEq of KCL, repeat BMP in AM.  -NPO  # DVT px: Heparin sq    Dispo: Disposition is deferred at this time, awaiting improvement of current medical problems. Anticipated discharge in approximately 2 day(s).   The patient does not have a current PCP (No PCP Per Patient), therefore is not requiring OPC follow-up after discharge.   The patient does not have transportation limitations that hinder transportation to clinic appointments.  Signed:  Lorretta Harp, MD PGY3, Internal Medicine Teaching Service Pager: [phone]  01/23/2014, 11:34 AM

## 2014-01-23 NOTE — ED Notes (Signed)
Pt states right flank pain. Pt states that the pain was present last night but it got worse throughout the night then suddenly became severe. Pt states problems with urination but denies blood in urine, burning or pressure. Pt states that the pain wraps around from her back near or kidney to her abdomen.

## 2014-01-23 NOTE — ED Notes (Signed)
Admit Doctor at bedside.  

## 2014-01-23 NOTE — ED Notes (Signed)
Patient not in room

## 2014-01-23 NOTE — ED Provider Notes (Signed)
CSN: [id number]     Arrival date & time 01/23/14  0408 History   First MD Initiated Contact with Patient 01/23/14 0413     Chief Complaint  Patient presents with  . Flank Pain    Patient is a 43 y.o. female presenting with flank pain. The history is provided by the patient.  Flank Pain This is a new problem. Episode onset: several hours ago. The problem occurs constantly. The problem has been rapidly worsening. Associated symptoms include abdominal pain. Pertinent negatives include no chest pain. Exacerbated by: movement. The symptoms are relieved by rest. She has tried rest for the symptoms. The treatment provided no relief.  pt reports sudden onset of right flank/abd pain  She has never had this before She reports difficulty urinating She reports nausea She had otherwise been well prior to this occurring earlier tonight  History reviewed. No pertinent past medical history. Past Surgical History  Procedure Laterality Date  . Cesarean section     History reviewed. No pertinent family history. History  Substance Use Topics  . Smoking status: Current Some Day Smoker  . Smokeless tobacco: Not on file  . Alcohol Use: Yes   OB History   Grav Para Term Preterm Abortions TAB SAB Ect Mult Living                 Review of Systems  Constitutional: Negative for fever.  Cardiovascular: Negative for chest pain.  Gastrointestinal: Positive for abdominal pain.  Genitourinary: Positive for flank pain.  All other systems reviewed and are negative.    Allergies  Bee venom; Sulfa antibiotics; and Naproxen  Home Medications   Current Outpatient Rx  Name  Route  Sig  Dispense  Refill  . HYDROcodone-acetaminophen (NORCO/VICODIN) 5-325 MG per tablet   Oral   Take 1 tablet by mouth every 4 (four) hours as needed for moderate pain.   20 tablet   0   . HYDROcodone-acetaminophen (NORCO/VICODIN) 5-325 MG per tablet   Oral   Take 1 tablet by mouth every 4 (four) hours as needed for  moderate pain.   12 tablet   0   . ibuprofen (ADVIL,MOTRIN) 200 MG tablet   Oral   Take 400-800 mg by mouth every 6 (six) hours as needed for moderate pain.          Marland Kitchen oxyCODONE-acetaminophen (PERCOCET/ROXICET) 5-325 MG per tablet   Oral   Take 1 tablet by mouth every 6 (six) hours as needed for severe pain.   15 tablet   0    BP 136/86  Pulse 88  Temp(Src) 99.7 F (37.6 C) (Oral)  Resp 16  SpO2 100%  LMP 12/18/2013 Physical Exam CONSTITUTIONAL: Well developed/well nourished, uncomfortable appearing HEAD: Normocephalic/atraumatic EYES: EOMI/PERRL ENMT: Mucous membranes moist NECK: supple no meningeal signs SPINE:entire spine nontender CV: S1/S2 noted, no murmurs/rubs/gallops noted LUNGS: Lungs are clear to auscultation bilaterally, no apparent distress ABDOMEN: soft, nontender, no rebound or guarding WU:JWJXB cva tenderness NEURO: Pt is awake/alert, moves all extremitiesx4 EXTREMITIES: pulses normal, full ROM SKIN: warm, color normal PSYCH: anxious  ED Course  Procedures (including critical care time) 5:43 AM Pt still with significant pain.  Her history/exam are c/w ureteral colic, will proceed with CT imaging 6:36 AM Pt with continued pain, reporting flank pain and pelvic pain No ureteral stones by CT imaging Appendix not identified by CT but story not classic for acute appendicitis and may also have ovarian pathology Pt will need pelvic ultrasound 6:48 AM On pelvic  exam (female chaperone present) Pt with right adnexal tenderness and white discharge.  No vaginal bleeding.  No cmt.  No adnexal mass  At signout, will need f/u on pelvic ultrasound to evaluate for any potential pelvic pathology  Labs Review Labs Reviewed  CBC WITH DIFFERENTIAL - Abnormal; Notable for the following:    WBC 14.3 (*)    Hemoglobin 11.7 (*)    HCT 35.9 (*)    MCV 77.7 (*)    MCH 25.3 (*)    RDW 17.6 (*)    Neutro Abs 11.0 (*)    All other components within normal limits   COMPREHENSIVE METABOLIC PANEL  URINALYSIS, ROUTINE W REFLEX MICROSCOPIC   Imaging Review No results found.  EKG Interpretation   None       MDM  No diagnosis found. Nursing notes including past medical history and social history reviewed and considered in documentation Labs/vital reviewed and considered     Joya Gaskinsonald W Jah Alarid, MD 01/23/14 [phone]

## 2014-01-23 NOTE — Consult Note (Signed)
Hemorrhagic ovarian cyst likely  Marta LamasJames O. Gae BonWyatt, III, MD, FACS [phone]--pager [phone]--[facility]

## 2014-01-23 NOTE — ED Notes (Signed)
Lactic acid results called to primary nurse Tammy SoursGreg thru secretary on POD A

## 2014-01-23 NOTE — ED Provider Notes (Addendum)
  Physical Exam  BP 125/86  Pulse 103  Temp(Src) 98.9 F (37.2 C) (Oral)  Resp 17  SpO2 100%  LMP 12/18/2013  Physical Exam  ED Course  Procedures  MDM  Assuming care of patient this morning from Dr. Bebe ShaggyWickline.  Patient in the ED for sudden onset right sided abd pain and flank pain. US shows no torsion - spoke with Radiologist, as the PACs is down - and he can appreciate some free fluid in the pelvis and some soft tissue inflammation around the ovaries. My exam shows RLQ tenderness - consistent with McBurneys and rebound tenderness. CT was reviewed - and appendix is not visualized.  DDx is PID vs. Appendicitis. I am thinking more PID, but the pain is out of proportion, she has no fevers, she is nor diabetic, she has no STD hx, and is not engaged in any activities that put her at risk for STDs - thus though PID from other flora is possible, but i think given the Gyne care and Surgery care is at 2 different facilities - i have asked Surgery to independently assess patient as well. Gyne called and is independently assessing the imaging.  IV AB started, npo   Annette KaplanAnkit Daun Rens, MD 01/23/14 0919  9:45 AM Gynecogy team reviewed the imaging, and indicate that patient has a 2 cm ovarian cyst, hemorrhagic.  They dont think it's pid, toa.   Annette KaplanAnkit Jaeleen Inzunza, MD 01/23/14 [phone]

## 2014-01-23 NOTE — Consult Note (Signed)
FACULTY PRACTICE ANTEPARTUM(COMPREHENSIVE) NOTE  Annette Anderson is a 43 y.o. presents with pain in RLQ that began on wed 28Nov. Responding to 800mg  Motrin q6hr.Last dose last night at 6pm. Pt reports worsening pain since that time. Pt reports "low grade" fever never above 100d. A/w nausea with minimal emesis starting Wednesday evening. Pt reports diarrhea Wednesday (denies new foods or sick contacts) improved with immodium. Last BM - yesterday. Increased frequency of urination and dysuria (began Thursday).   Suprapubic pain to RLQ: described as a 9/10 constant pain with stabbing. Worse with movement, improves with position. Motrin no longer helping with pain. Worse than usual menstraul pain.   No CP, SOB with hurt ribs, no Headaches, vision changes, occassional hot flashes.   Cycle: irregular q2-37months, when bleeds usually heavy  PMHx: Reports thyroid problems FHx: Hyperthyroid, GERD PShx: csection x3, esophageal surgery for achalasea  Reports remote history of ghonorrhea. Reports stable relationship currently  Meds: Priolosec, b12, MVI  Vitals:  Blood pressure 134/81, pulse 89, temperature 99 F (37.2 C), temperature source Oral, resp. rate 16, last menstrual period 12/18/2013, SpO2 100.00%. Filed Vitals:   01/23/14 1045 01/23/14 1100 01/23/14 1115 01/23/14 1152  BP: 129/66 133/80 122/74 134/81  Pulse: 87 92 95 89  Temp:    99 F (37.2 C)  TempSrc:    Oral  Resp:    16  SpO2: 99% 99% 100% 100%   Physical Examination:  General appearance - oriented to person, place, and time, well hydrated, in mild to moderate distress and pt able to talk in complete sentences as long as she isnt moving in pain No goiter appreciated, exoptholmos appreciated Heart - normal rate and regular rhythm Abdomen - Tender to palpation on suprapubic and RLQ, neg rebound, positive percussion.  Mild CVAT on right SVE: pt with significant cervical motion tenderness at this time. Extremities: extremities  normal, atraumatic, no cyanosis or edema and Homans sign is negative, no sign of DVT with DTRs 1+ bilaterally    Labs:  Results for orders placed during the hospital encounter of 01/23/14 (from the past 24 hour(s))  CBC WITH DIFFERENTIAL   Collection Time    01/23/14  4:19 AM      Result Value Range   WBC 14.3 (*) 4.0 - 10.5 K/uL   RBC 4.62  3.87 - 5.11 MIL/uL   Hemoglobin 11.7 (*) 12.0 - 15.0 g/dL   HCT 40.9 (*) 81.1 - 91.4 %   MCV 77.7 (*) 78.0 - 100.0 fL   MCH 25.3 (*) 26.0 - 34.0 pg   MCHC 32.6  30.0 - 36.0 g/dL   RDW 78.2 (*) 95.6 - 21.3 %   Platelets 325  150 - 400 K/uL   Neutrophils Relative % 77  43 - 77 %   Neutro Abs 11.0 (*) 1.7 - 7.7 K/uL   Lymphocytes Relative 18  12 - 46 %   Lymphs Abs 2.5  0.7 - 4.0 K/uL   Monocytes Relative 5  3 - 12 %   Monocytes Absolute 0.7  0.1 - 1.0 K/uL   Eosinophils Relative 1  0 - 5 %   Eosinophils Absolute 0.1  0.0 - 0.7 K/uL   Basophils Relative 0  0 - 1 %   Basophils Absolute 0.0  0.0 - 0.1 K/uL  COMPREHENSIVE METABOLIC PANEL   Collection Time    01/23/14  4:19 AM      Result Value Range   Sodium 139  137 - 147 mEq/L   Potassium 3.6 (*)  3.7 - 5.3 mEq/L   Chloride 102  96 - 112 mEq/L   CO2 16 (*) 19 - 32 mEq/L   Glucose, Bld 137 (*) 70 - 99 mg/dL   BUN 11  6 - 23 mg/dL   Creatinine, Ser 1.610.71  0.50 - 1.10 mg/dL   Calcium 9.3  8.4 - 09.610.5 mg/dL   Total Protein 7.8  6.0 - 8.3 g/dL   Albumin 4.0  3.5 - 5.2 g/dL   AST 22  0 - 37 U/L   ALT 13  0 - 35 U/L   Alkaline Phosphatase 78  39 - 117 U/L   Total Bilirubin 0.4  0.3 - 1.2 mg/dL   GFR calc non Af Amer >90  >90 mL/min   GFR calc Af Amer >90  >90 mL/min  URINALYSIS, ROUTINE W REFLEX MICROSCOPIC   Collection Time    01/23/14  5:33 AM      Result Value Range   Color, Urine YELLOW  YELLOW   APPearance CLEAR  CLEAR   Specific Gravity, Urine 1.021  1.005 - 1.030   pH 5.5  5.0 - 8.0   Glucose, UA NEGATIVE  NEGATIVE mg/dL   Hgb urine dipstick NEGATIVE  NEGATIVE   Bilirubin Urine  NEGATIVE  NEGATIVE   Ketones, ur 40 (*) NEGATIVE mg/dL   Protein, ur NEGATIVE  NEGATIVE mg/dL   Urobilinogen, UA 0.2  0.0 - 1.0 mg/dL   Nitrite NEGATIVE  NEGATIVE   Leukocytes, UA NEGATIVE  NEGATIVE  POCT PREGNANCY, URINE   Collection Time    01/23/14  5:41 AM      Result Value Range   Preg Test, Ur NEGATIVE  NEGATIVE  WET PREP, GENITAL   Collection Time    01/23/14  6:46 AM      Result Value Range   Yeast Wet Prep HPF POC NONE SEEN  NONE SEEN   Trich, Wet Prep NONE SEEN  NONE SEEN   Clue Cells Wet Prep HPF POC NONE SEEN  NONE SEEN   WBC, Wet Prep HPF POC NONE SEEN  NONE SEEN  CG4 I-STAT (LACTIC ACID)   Collection Time    01/23/14  9:45 AM      Result Value Range   Lactic Acid, Venous 2.29 (*) 0.5 - 2.2 mmol/L  CBC WITH DIFFERENTIAL   Collection Time    01/23/14 10:59 AM      Result Value Range   WBC 11.7 (*) 4.0 - 10.5 K/uL   RBC 4.01  3.87 - 5.11 MIL/uL   Hemoglobin 10.0 (*) 12.0 - 15.0 g/dL   HCT 04.531.6 (*) 40.936.0 - 81.146.0 %   MCV 78.8  78.0 - 100.0 fL   MCH 24.9 (*) 26.0 - 34.0 pg   MCHC 31.6  30.0 - 36.0 g/dL   RDW 91.417.6 (*) 78.211.5 - 95.615.5 %   Platelets 265  150 - 400 K/uL   Neutrophils Relative % 83 (*) 43 - 77 %   Neutro Abs 9.7 (*) 1.7 - 7.7 K/uL   Lymphocytes Relative 13  12 - 46 %   Lymphs Abs 1.5  0.7 - 4.0 K/uL   Monocytes Relative 3  3 - 12 %   Monocytes Absolute 0.4  0.1 - 1.0 K/uL   Eosinophils Relative 0  0 - 5 %   Eosinophils Absolute 0.0  0.0 - 0.7 K/uL   Basophils Relative 0  0 - 1 %   Basophils Absolute 0.0  0.0 - 0.1 K/uL   GC/C  Pending  Imaging Studies:    01/23/2014 US Transvaginal  IMPRESSION:  Nonspecific soft tissue prominence around the right ovary suggesting  inflammation or infection. Normal perfusion to the ovaries. Small  amount of free fluid in the pelvis.  CT abdomen Pelvis IMPRESSION:  No hydronephrosis or urinary tract calculi identified.  Appendix not identified.  Mild right adnexal stranding/fluid may be physiologic. Correlate   with pelvic ultrasound if concerned for acute pathology.  Distal esophageal wall thickening may reflect esophagitis or  occurred.  Electronically Signed  By: Jearld Lesch M.D.  On: 01/23/2014 06:24   Medications:  Scheduled . doxycycline (VIBRAMYCIN) IV  100 mg Intravenous Q12H  . sodium chloride  1,000 mL Intravenous Once   I have reviewed the patient's current medications.  ASSESSMENT: Annette Anderson is a 43 y.o.  who is admitted for evaluation of possible PID given CMT, cannot rule out appendicitis and appreciate surgery following..    Patient Active Problem List   Diagnosis Date Noted  . Abdominal pain 01/23/2014  . Anemia 01/23/2014    PLAN: #Possible PID w/ CMT, reassuring lack of SIRS criteria. Appreciate assistance from General surgery.  - Continue Ceftriaxone and Doxy - F/u G/C cultures - Gyn will continue to follow  - Will defer pain control to primary team  #Exopthalmos: - Recommend evaluation of TSH while patient is inpatient given strong family hx of personal history  Dispo/FEN: Defer to primary team  Thank you for the opportunity to help care for our patient.  Discussed with Dr. Elsie Lincoln Annette Anderson RYAN 01/23/2014,1:07 PM

## 2014-01-23 NOTE — Consult Note (Signed)
Reason for Consult:  Abdominal pain, nausea, diarrhea, and vomiting. Referring Physician: Dr Varney Biles, MD (ED)   Annette Anderson is an 43 y.o. female.  HPI: This is a normally healthy 43 y/o who started having pain lower abdomen and going to her back, in the evening of 01/21/14.  She normally has pain abdomen, and going to back with very irregular menstrual cycles.  She takes 800 mg ibuprofen and it make it manageable.  She would have pain, but be able to function.  She started with these symptoms 1/28, and took the ibuprofen.  She then developed nausea, vomiting and some diarrhea.  She had about 10-12 episodes of diarrhea yesterday.  She could keep down some liquids and crackers.  She went to work last PM, and this involves allot of walking. [place] was painful.  She had some diarrhea, that was somewhat better with imodium. She continues to have pain and nausea, and came to the ER.  She had a low grade fever at home around 100. She says it hurts to void and voiding does not make her better. She continues to have some low grade fever, in the ER, ongoing abdominal pain that is over her entire abdomen, but her primary pain is mid lower abdomen.  It hurts to sit up and move.  She can raise her right leg without much discomfort, but not her left leg.   WBC is 14.3, K+ 3.6, lactic acid is 2.29, wet prep negative, U/a is negative.  CT scan shows no hydronephrosis, some diverticulosis, but no diverticulitis or colitis, appendix did not visualize, partially decompressed bladder, with some right adnexal stranding and some small amount of free fluid.  No free air or lymphadenopathy.  Distal esophageal wall  thickening and small hiatal hernia. Trans abdominal and transvaginal US shows :  Nonspecific soft tissue prominence around the right ovary suggesting inflammation or infection. Normal perfusion to the ovaries. Small amount of free fluid in the pelvis.     History reviewed. No pertinent past medical  history.  Past Surgical History  Procedure Laterality Date   Thyroid disease.  She says she is hyperthyroid, but has been on synthroid in the past.  Just moved no PCP here.     Hx of achalasia with surgery about 20 years ago; left thoracotomy ? cardiomyotomy    Recent fall 11/2013 with recurrent visits for right sided pain No rib fx noted on films    Long term abnormal menstrual cycles    . Cesarean section     PSH:  3 C sections  Prior attempted balloon dilitations  L thoracotomy for possible cardiomyotomy. Family History:  Brother and sister have thyroid disease  Mother living with high blood pressure  Father living with AODM  Social History:  reports that she has been smoking.  She does not have any smokeless tobacco history on file. She reports that she drinks alcohol. She reports that she does not use illicit drugs. Tobacco:  1/2 PPD or less for 8 years DRugs:  None ETOH:  Social Works evening with a good deal of walking    3 teens at home. Divorced Allergies:  Allergies  Allergen Reactions  . Bee Venom Anaphylaxis  . Sulfa Antibiotics Anaphylaxis and Hives  . Naproxen Hives    Prior to Admission medications   Medication Sig Start Date End Date Taking? Authorizing Provider  Cyanocobalamin (VITAMIN B 12 PO) Take 1 tablet by mouth daily.   Yes Historical Provider, MD  HYDROcodone-acetaminophen (NORCO/VICODIN)  5-325 MG per tablet Take 1 tablet by mouth every 4 (four) hours as needed for moderate pain. 01/11/14  Yes Catherine E Schinlever, PA-C  ibuprofen (ADVIL,MOTRIN) 200 MG tablet Take 400-800 mg by mouth every 6 (six) hours as needed for moderate pain.    Yes Historical Provider, MD    Scheduled:  Continuous: . doxycycline (VIBRAMYCIN) IV 100 mg (01/23/14 1045)  . sodium chloride     DDU:[id number] (DILAUDID) injection, ondansetron Anti-infectives   Start     Dose/Rate Route Frequency Ordered Stop   01/23/14 0845  cefTRIAXone (ROCEPHIN) 1 g in dextrose 5 % 50 mL  IVPB     1 g 100 mL/hr over 30 Minutes Intravenous  Once 01/23/14 0844 01/23/14 1001   01/23/14 0845  doxycycline (VIBRAMYCIN) 100 mg in dextrose 5 % 250 mL IVPB     100 mg 125 mL/hr over 120 Minutes Intravenous Every 12 hours 01/23/14 0844        Results for orders placed during the hospital encounter of 01/23/14 (from the past 48 hour(s))  CBC WITH DIFFERENTIAL     Status: Abnormal   Collection Time    01/23/14  4:19 AM      Result Value Range   WBC 14.3 (*) 4.0 - 10.5 K/uL   RBC 4.62  3.87 - 5.11 MIL/uL   Hemoglobin 11.7 (*) 12.0 - 15.0 g/dL   HCT 35.9 (*) 36.0 - 46.0 %   MCV 77.7 (*) 78.0 - 100.0 fL   MCH 25.3 (*) 26.0 - 34.0 pg   MCHC 32.6  30.0 - 36.0 g/dL   RDW 17.6 (*) 11.5 - 15.5 %   Platelets 325  150 - 400 K/uL   Neutrophils Relative % 77  43 - 77 %   Neutro Abs 11.0 (*) 1.7 - 7.7 K/uL   Lymphocytes Relative 18  12 - 46 %   Lymphs Abs 2.5  0.7 - 4.0 K/uL   Monocytes Relative 5  3 - 12 %   Monocytes Absolute 0.7  0.1 - 1.0 K/uL   Eosinophils Relative 1  0 - 5 %   Eosinophils Absolute 0.1  0.0 - 0.7 K/uL   Basophils Relative 0  0 - 1 %   Basophils Absolute 0.0  0.0 - 0.1 K/uL  COMPREHENSIVE METABOLIC PANEL     Status: Abnormal   Collection Time    01/23/14  4:19 AM      Result Value Range   Sodium 139  137 - 147 mEq/L   Potassium 3.6 (*) 3.7 - 5.3 mEq/L   Chloride 102  96 - 112 mEq/L   CO2 16 (*) 19 - 32 mEq/L   Glucose, Bld 137 (*) 70 - 99 mg/dL   BUN 11  6 - 23 mg/dL   Creatinine, Ser 0.71  0.50 - 1.10 mg/dL   Calcium 9.3  8.4 - 10.5 mg/dL   Total Protein 7.8  6.0 - 8.3 g/dL   Albumin 4.0  3.5 - 5.2 g/dL   AST 22  0 - 37 U/L   ALT 13  0 - 35 U/L   Alkaline Phosphatase 78  39 - 117 U/L   Total Bilirubin 0.4  0.3 - 1.2 mg/dL   GFR calc non Af Amer >90  >90 mL/min   GFR calc Af Amer >90  >90 mL/min   Comment: (NOTE)     The eGFR has been calculated using the CKD EPI equation.     This calculation has not been  validated in all clinical situations.      eGFR's persistently <90 mL/min signify possible Chronic Kidney     Disease.  URINALYSIS, ROUTINE W REFLEX MICROSCOPIC     Status: Abnormal   Collection Time    01/23/14  5:33 AM      Result Value Range   Color, Urine YELLOW  YELLOW   APPearance CLEAR  CLEAR   Specific Gravity, Urine 1.021  1.005 - 1.030   pH 5.5  5.0 - 8.0   Glucose, UA NEGATIVE  NEGATIVE mg/dL   Hgb urine dipstick NEGATIVE  NEGATIVE   Bilirubin Urine NEGATIVE  NEGATIVE   Ketones, ur 40 (*) NEGATIVE mg/dL   Protein, ur NEGATIVE  NEGATIVE mg/dL   Urobilinogen, UA 0.2  0.0 - 1.0 mg/dL   Nitrite NEGATIVE  NEGATIVE   Leukocytes, UA NEGATIVE  NEGATIVE   Comment: MICROSCOPIC NOT DONE ON URINES WITH NEGATIVE PROTEIN, BLOOD, LEUKOCYTES, NITRITE, OR GLUCOSE <1000 mg/dL.  POCT PREGNANCY, URINE     Status: None   Collection Time    01/23/14  5:41 AM      Result Value Range   Preg Test, Ur NEGATIVE  NEGATIVE   Comment:            THE SENSITIVITY OF THIS     METHODOLOGY IS >24 mIU/mL  WET PREP, GENITAL     Status: None   Collection Time    01/23/14  6:46 AM      Result Value Range   Yeast Wet Prep HPF POC NONE SEEN  NONE SEEN   Trich, Wet Prep NONE SEEN  NONE SEEN   Clue Cells Wet Prep HPF POC NONE SEEN  NONE SEEN   WBC, Wet Prep HPF POC NONE SEEN  NONE SEEN  CG4 I-STAT (LACTIC ACID)     Status: Abnormal   Collection Time    01/23/14  9:45 AM      Result Value Range   Lactic Acid, Venous 2.29 (*) 0.5 - 2.2 mmol/L    Ct Abdomen Pelvis Wo Contrast  01/23/2014   CLINICAL DATA:  Right flank pain  EXAM: CT ABDOMEN AND PELVIS WITHOUT CONTRAST  TECHNIQUE: Multidetector CT imaging of the abdomen and pelvis was performed following the standard protocol without intravenous contrast.  COMPARISON:  None.  FINDINGS: Linear right middle lobe opacity is favored to reflect atelectasis or scarring. Heart size within normal limits. Distal esophageal wall thickening and small hiatal hernia.  Organ evaluation is limited without intravenous  contrast. Within this limitation, no appreciable abnormality of the liver, biliary system, pancreas, spleen, adrenal glands.  Symmetric renal size. No hydroureteronephrosis no definite urinary tract calculi. Unable to follow the ureteral course in their entirety given the decompressed state.  Colonic diverticulosis. No CT evidence for colitis or diverticulitis. Appendix not identified. Small bowel loops are of normal course and caliber. No free intraperitoneal air. No lymphadenopathy.  Normal caliber aorta and branch vessels.  Partially decompressed bladder. Nonspecific appearance to the uterus and adnexa. There is mild stranding in the right adnexa and small amount of associated free fluid.  No acute osseous finding.  IMPRESSION: No hydronephrosis or urinary tract calculi identified.  Appendix not identified.  Mild right adnexal stranding/fluid may be physiologic. Correlate with pelvic ultrasound if concerned for acute pathology.  Distal esophageal wall thickening may reflect esophagitis or occurred.   Electronically Signed   By: Carlos Levering M.D.   On: 01/23/2014 06:24    Review of Systems  Constitutional: Positive  for fever and malaise/fatigue. Negative for chills, weight loss and diaphoresis.  HENT: Negative.   Eyes: Negative.   Respiratory:       She feels uncomfortable with less than a 45degree angle to sleep.  Occasionally she wake up short of breath  Cardiovascular: Negative.   Gastrointestinal: Positive for heartburn, nausea, vomiting, abdominal pain and diarrhea. Negative for constipation, blood in stool and melena.  Genitourinary: Positive for dysuria.  Musculoskeletal: Positive for back pain and falls (fell in dec with back and right sideed pain.). Negative for joint pain and neck pain.  Skin: Negative.   Neurological: Negative.  Negative for weakness.  Endo/Heme/Allergies: Negative.   Psychiatric/Behavioral: Negative.    Blood pressure 125/86, pulse 103, temperature 99.5 F (37.5  C), temperature source Oral, resp. rate 17, last menstrual period 12/18/2013, SpO2 100.00%. Physical Exam  Constitutional: She is oriented to person, place, and time. She appears well-developed and well-nourished.  Tachycardic with HR up to 90's Temp 99.7 with my exam, she feels warmer. 131/83 now  HENT:  Head: Normocephalic and atraumatic.  Eyes: Conjunctivae and EOM are normal. Pupils are equal, round, and reactive to light. Right eye exhibits no discharge. Left eye exhibits no discharge. No scleral icterus.  exophthalmos with hx of abnormal thyroid  Neck: Normal range of motion. Neck supple. No JVD present. No tracheal deviation present. No thyromegaly present.  Cardiovascular: Regular rhythm, normal heart sounds and intact distal pulses.  Exam reveals no gallop.   No murmur heard. Respiratory: Effort normal and breath sounds normal. No respiratory distress. She has no wheezes. She has no rales. She exhibits tenderness (she has been sore on the right from fall, but that is not bothering her now.).  Hurts to sit up and breath deep  GI: Soft. Bowel sounds are normal. She exhibits no distension and no mass. There is tenderness (she is tender to palpation, lower and upper abdomen.  complains of pain in abdomen and going to right back.). There is guarding. There is no rebound.  She can raise her right leg, but not her left because of the pain.  It hurts to sit up.  Musculoskeletal: She exhibits no edema and no tenderness.  Lymphadenopathy:    She has no cervical adenopathy.  Neurological: She is alert and oriented to person, place, and time. No cranial nerve deficit.  Skin: Skin is warm and dry. No rash noted. She is not diaphoretic. No erythema. No pallor.  Psychiatric: She has a normal mood and affect. Her behavior is normal. Judgment and thought content normal.    Assessment/Plan: 1.  Abdominal pain, nausea, vomiting and diarrhea 2.  Prominent Right ovary, possible hemorrhagic ovarian  cyst 3. Chronic abnormal menstrual cycles, with similar pain, but not this bad. 4.  Thyroid disease 5.  Hx of achalasia with prior left thoracotomy/reflux symptoms lying down  Plan:  Dr. Hulen Skains has seen and will ask medicine to admit.  She has not acute finding on Ct to go along with symptoms.  We will follow with you.  Annette Anderson 01/23/2014, 10:18 AM

## 2014-01-23 NOTE — ED Notes (Signed)
Pt ambulated to bathroom and provided a urine sample. Pt states no relief from pain medication.

## 2014-01-23 NOTE — ED Notes (Signed)
Pt ambulating to bathroom.

## 2014-01-23 NOTE — ED Notes (Signed)
Pt has 250 remaining in IV bag.

## 2014-01-23 NOTE — Consult Note (Signed)
Attestation of Attending Supervision of Fellow: Evaluation and management procedures were performed by the Fellow under my supervision and collaboration.  I have reviewed the Fellow's note and chart, and I agree with the management and plan.    

## 2014-01-23 NOTE — ED Notes (Signed)
Pt still has of first bag hanging.

## 2014-01-23 NOTE — Progress Notes (Signed)
Patient sitting up at bedside, seemed comfortable until asked about discomfort.  Good bowel sounds.  Explained likely diagnosis of ruptured hemorrhagic ovarian cyst.  Will follow.  Annette LamasJames O. Gae BonWyatt, III, MD, FACS [phone]--pager [phone]--office [facility]

## 2014-01-23 NOTE — ED Notes (Signed)
Surgeon at bedside.  

## 2014-01-24 ENCOUNTER — Observation Stay (HOSPITAL_COMMUNITY): Payer: Self-pay

## 2014-01-24 DIAGNOSIS — R109 Unspecified abdominal pain: Secondary | ICD-10-CM

## 2014-01-24 DIAGNOSIS — E860 Dehydration: Secondary | ICD-10-CM

## 2014-01-24 DIAGNOSIS — D509 Iron deficiency anemia, unspecified: Secondary | ICD-10-CM

## 2014-01-24 DIAGNOSIS — K219 Gastro-esophageal reflux disease without esophagitis: Secondary | ICD-10-CM

## 2014-01-24 DIAGNOSIS — E039 Hypothyroidism, unspecified: Secondary | ICD-10-CM

## 2014-01-24 LAB — CBC WITH DIFFERENTIAL/PLATELET
BASOS ABS: 0 10*3/uL (ref 0.0–0.1)
Basophils Relative: 1 % (ref 0–1)
EOS PCT: 2 % (ref 0–5)
Eosinophils Absolute: 0.2 10*3/uL (ref 0.0–0.7)
HCT: 30.4 % — ABNORMAL LOW (ref 36.0–46.0)
Hemoglobin: 9.5 g/dL — ABNORMAL LOW (ref 12.0–15.0)
Lymphocytes Relative: 28 % (ref 12–46)
Lymphs Abs: 2.3 10*3/uL (ref 0.7–4.0)
MCH: 25.3 pg — ABNORMAL LOW (ref 26.0–34.0)
MCHC: 31.3 g/dL (ref 30.0–36.0)
MCV: 80.9 fL (ref 78.0–100.0)
Monocytes Absolute: 0.4 10*3/uL (ref 0.1–1.0)
Monocytes Relative: 5 % (ref 3–12)
Neutro Abs: 5.3 10*3/uL (ref 1.7–7.7)
Neutrophils Relative %: 65 % (ref 43–77)
PLATELETS: 257 10*3/uL (ref 150–400)
RBC: 3.76 MIL/uL — ABNORMAL LOW (ref 3.87–5.11)
RDW: 17.8 % — AB (ref 11.5–15.5)
WBC: 8.2 10*3/uL (ref 4.0–10.5)

## 2014-01-24 LAB — BASIC METABOLIC PANEL
BUN: 4 mg/dL — ABNORMAL LOW (ref 6–23)
CALCIUM: 8.6 mg/dL (ref 8.4–10.5)
CO2: 24 mEq/L (ref 19–32)
Chloride: 108 mEq/L (ref 96–112)
Creatinine, Ser: 0.71 mg/dL (ref 0.50–1.10)
GFR calc Af Amer: >90 mL/min (ref >90)
GLUCOSE: 99 mg/dL (ref 70–99)
Potassium: 3.8 mEq/L (ref 3.7–5.3)
Sodium: 142 mEq/L (ref 137–147)

## 2014-01-24 LAB — LACTIC ACID, PLASMA: Lactic Acid, Venous: 0.7 mmol/L (ref 0.5–2.2)

## 2014-01-24 LAB — HIV ANTIBODY (ROUTINE TESTING W REFLEX): HIV: NONREACTIVE

## 2014-01-24 MED ORDER — INFLUENZA VAC SPLIT QUAD 0.5 ML IM SUSP
0.5000 mL | INTRAMUSCULAR | Status: AC
  Administered 2014-01-25: 0.5 mL via INTRAMUSCULAR
  Filled 2014-01-24: qty 0.5

## 2014-01-24 MED ORDER — ONDANSETRON HCL 4 MG/2ML IJ SOLN
4.0000 mg | Freq: Four times a day (QID) | INTRAMUSCULAR | Status: DC
  Administered 2014-01-24 – 2014-01-26 (×7): 4 mg via INTRAVENOUS
  Filled 2014-01-24 (×8): qty 2

## 2014-01-24 MED ORDER — LEVOTHYROXINE SODIUM 150 MCG PO TABS
150.0000 ug | ORAL_TABLET | Freq: Every day | ORAL | Status: DC
  Administered 2014-01-24 – 2014-01-27 (×4): 150 ug via ORAL
  Filled 2014-01-24 (×6): qty 1

## 2014-01-24 NOTE — Progress Notes (Addendum)
Subjective:  Pt seen and examined. Pt with RLQ abdominal pain and suprapubic pain. She reports no fever, chills, nausea, vomiting, or change in BM. She reports she is passing gas normally. She reports pressure when she urinates without dysuria or burning. No cervical discharge or vaginal pain.       Objective: Vital signs in last 24 hours: Filed Vitals:   01/23/14 1347 01/23/14 2108 01/24/14 0138 01/24/14 0537  BP:  137/67 144/83 125/83  Pulse:  80 77 75  Temp:  98.7 F (37.1 C) 98.5 F (36.9 C) 98.4 F (36.9 C)  TempSrc:  Oral  Oral  Resp:  '18 18 17  ' Height: '5\' 1"'  (1.549 m)     Weight: 78.926 kg (174 lb)     SpO2:  100% 100% 100%   Weight change:   Intake/Output Summary (Last 24 hours) at 01/24/14 1258 Last data filed at 01/24/14 0538  Gross per 24 hour  Intake   1411 ml  Output   1550 ml  Net   -139 ml   General: Not in acute distress  HEENT: EOMI, exophthalmus b/l R>L  Cardiac: S1/S2, RRR, No murmurs, gallops or rubs  Pulm: Good air movement bilaterally. Clear to auscultation bilaterally. No rales, wheezing, rhonchi or rubs.  Abd: Tenderness to palpation of RLQ and suprapubic region with rebound tenderness. Normal BS present. No CVA tenderness (when tapped over R CVA, patient had abdomen pain).  Ext: No edema.   Musculoskeletal: No joint deformities, erythema, or stiffness, ROM full  Skin: No rashes.  Neuro: Alert and oriented X3.  Psych: Patient is not psychotic, no suicidal or hemocidal ideation.  Lab Results: Basic Metabolic Panel:  Recent Labs Lab 01/23/14 0419 01/24/14 0525  NA 139 142  K 3.6* 3.8  CL 102 108  CO2 16* 24  GLUCOSE 137* 99  BUN 11 4*  CREATININE 0.71 0.71  CALCIUM 9.3 8.6   Liver Function Tests:  Recent Labs Lab 01/23/14 0419  AST 22  ALT 13  ALKPHOS 78  BILITOT 0.4  PROT 7.8  ALBUMIN 4.0    Recent Labs Lab 01/23/14 1600  LIPASE 29   No results found for this basename: AMMONIA,  in the last 168 hours CBC:  Recent  Labs Lab 01/23/14 1059 01/24/14 0525  WBC 11.7* 8.2  NEUTROABS 9.7* 5.3  HGB 10.0* 9.5*  HCT 31.6* 30.4*  MCV 78.8 80.9  PLT 265 257   Cardiac Enzymes: No results found for this basename: CKTOTAL, CKMB, CKMBINDEX, TROPONINI,  in the last 168 hours BNP: No results found for this basename: PROBNP,  in the last 168 hours D-Dimer: No results found for this basename: DDIMER,  in the last 168 hours CBG: No results found for this basename: GLUCAP,  in the last 168 hours Hemoglobin A1C: No results found for this basename: HGBA1C,  in the last 168 hours Fasting Lipid Panel: No results found for this basename: CHOL, HDL, LDLCALC, TRIG, CHOLHDL, LDLDIRECT,  in the last 168 hours Thyroid Function Tests:  Recent Labs Lab 01/23/14 1600  TSH 20.225*  FREET4 0.66*   Coagulation:  Recent Labs Lab 01/23/14 1600  LABPROT 13.7  INR 1.07   Anemia Panel:  Recent Labs Lab 01/23/14 1600  VITAMINB12 659  FOLATE 15.2  FERRITIN 20  TIBC 430  IRON 24*  RETICCTPCT 1.1   Urine Drug Screen: Drugs of Abuse  No results found for this basename: labopia, cocainscrnur, labbenz, amphetmu, thcu, labbarb    Alcohol Level: No  results found for this basename: ETH,  in the last 168 hours Urinalysis:  Recent Labs Lab 01/23/14 0533  COLORURINE YELLOW  LABSPEC 1.021  PHURINE 5.5  GLUCOSEU NEGATIVE  HGBUR NEGATIVE  BILIRUBINUR NEGATIVE  KETONESUR 40*  PROTEINUR NEGATIVE  UROBILINOGEN 0.2  NITRITE NEGATIVE  LEUKOCYTESUR NEGATIVE    Micro Results: Recent Results (from the past 240 hour(s))  GC/CHLAMYDIA PROBE AMP     Status: None   Collection Time    01/23/14  6:44 AM      Result Value Range Status   CT Probe RNA NEGATIVE  NEGATIVE Final   GC Probe RNA NEGATIVE  NEGATIVE Final   Comment: (NOTE)                                                                                               **Normal Reference Range: Negative**          Assay performed using the Gen-Probe APTIMA  COMBO2 (R) Assay.     Acceptable specimen types for this assay include APTIMA Swabs (Unisex,     endocervical, urethral, or vaginal), first void urine, and ThinPrep     liquid based cytology samples.     Performed at [place], [facility]     Status: None   Collection Time    01/23/14  6:46 AM      Result Value Range Status   Yeast Wet Prep HPF POC NONE SEEN  NONE SEEN Final   Trich, Wet Prep NONE SEEN  NONE SEEN Final   Clue Cells Wet Prep HPF POC NONE SEEN  NONE SEEN Final   WBC, Wet Prep HPF POC NONE SEEN  NONE SEEN Final   Studies/Results: Ct Abdomen Pelvis Wo Contrast  01/23/2014   CLINICAL DATA:  Right flank pain  EXAM: CT ABDOMEN AND PELVIS WITHOUT CONTRAST  TECHNIQUE: Multidetector CT imaging of the abdomen and pelvis was performed following the standard protocol without intravenous contrast.  COMPARISON:  None.  FINDINGS: Linear right middle lobe opacity is favored to reflect atelectasis or scarring. Heart size within normal limits. Distal esophageal wall thickening and small hiatal hernia.  Organ evaluation is limited without intravenous contrast. Within this limitation, no appreciable abnormality of the liver, biliary system, pancreas, spleen, adrenal glands.  Symmetric renal size. No hydroureteronephrosis no definite urinary tract calculi. Unable to follow the ureteral course in their entirety given the decompressed state.  Colonic diverticulosis. No CT evidence for colitis or diverticulitis. Appendix not identified. Small bowel loops are of normal course and caliber. No free intraperitoneal air. No lymphadenopathy.  Normal caliber aorta and branch vessels.  Partially decompressed bladder. Nonspecific appearance to the uterus and adnexa. There is mild stranding in the right adnexa and small amount of associated free fluid.  No acute osseous finding.  IMPRESSION: No hydronephrosis or urinary tract calculi identified.  Appendix not identified.  Mild right adnexal  stranding/fluid may be physiologic. Correlate with pelvic ultrasound if concerned for acute pathology.  Distal esophageal wall thickening may reflect esophagitis or occurred.   Electronically Signed   By: Delano Metz.D.  On: 01/23/2014 06:24   US Transvaginal Non-ob  01/23/2014   CLINICAL DATA:  Severe right adnexal pain and tenderness.  EXAM: TRANSABDOMINAL AND TRANSVAGINAL ULTRASOUND OF PELVIS  DOPPLER ULTRASOUND OF OVARIES  TECHNIQUE: Both transabdominal and transvaginal ultrasound examinations of the pelvis were performed. Transabdominal technique was performed for global imaging of the pelvis including uterus, ovaries, adnexal regions, and pelvic cul-de-sac.  It was necessary to proceed with endovaginal exam following the transabdominal exam to visualize the ovaries and adnexa. Color and duplex Doppler ultrasound was utilized to evaluate blood flow to the ovaries.  COMPARISON:  CT scan dated 01/23/2014  FINDINGS: Uterus  Measurements: 8.2 x 3.7 x 5.5 cm. No fibroids or other mass visualized.  Endometrium  Thickness: 10.7 mm. 3 mm cystic area in the endometrium of unknown etiology. The patient has a negative pregnancy test.  Right ovary  Measurements: 3.4 x 2.2 x 2.7 cm. The adnexal tissues around the right ovary are slightly prominent. There is normal arterial and venous perfusion to the right ovary. The patient was quite tender in the right adnexal region.  Left ovary  Measurements: 2.1 x 1.3 x 1.7 cm. Normal appearance/no adnexal mass. There is normal arterial and venous perfusion to the left ovary.  Pulsed Doppler evaluation of both ovaries demonstrates normal low-resistance arterial and venous waveforms.  Other findings  Small amount of free fluid in the pelvis for.  IMPRESSION: Nonspecific soft tissue prominence around the right ovary suggesting inflammation or infection. Normal perfusion to the ovaries. Small amount of free fluid in the pelvis.   Electronically Signed   By: Rozetta Nunnery M.D.    On: 01/23/2014 10:42   US Pelvis Complete  01/23/2014   CLINICAL DATA:  Severe right adnexal pain and tenderness.  EXAM: TRANSABDOMINAL AND TRANSVAGINAL ULTRASOUND OF PELVIS  DOPPLER ULTRASOUND OF OVARIES  TECHNIQUE: Both transabdominal and transvaginal ultrasound examinations of the pelvis were performed. Transabdominal technique was performed for global imaging of the pelvis including uterus, ovaries, adnexal regions, and pelvic cul-de-sac.  It was necessary to proceed with endovaginal exam following the transabdominal exam to visualize the ovaries and adnexa. Color and duplex Doppler ultrasound was utilized to evaluate blood flow to the ovaries.  COMPARISON:  CT scan dated 01/23/2014  FINDINGS: Uterus  Measurements: 8.2 x 3.7 x 5.5 cm. No fibroids or other mass visualized.  Endometrium  Thickness: 10.7 mm. 3 mm cystic area in the endometrium of unknown etiology. The patient has a negative pregnancy test.  Right ovary  Measurements: 3.4 x 2.2 x 2.7 cm. The adnexal tissues around the right ovary are slightly prominent. There is normal arterial and venous perfusion to the right ovary. The patient was quite tender in the right adnexal region.  Left ovary  Measurements: 2.1 x 1.3 x 1.7 cm. Normal appearance/no adnexal mass. There is normal arterial and venous perfusion to the left ovary.  Pulsed Doppler evaluation of both ovaries demonstrates normal low-resistance arterial and venous waveforms.  Other findings  Small amount of free fluid in the pelvis for.  IMPRESSION: Nonspecific soft tissue prominence around the right ovary suggesting inflammation or infection. Normal perfusion to the ovaries. Small amount of free fluid in the pelvis.   Electronically Signed   By: Rozetta Nunnery M.D.   On: 01/23/2014 10:42   [place] Art/ven Flow Abd Pelv Doppler  01/23/2014   CLINICAL DATA:  Severe right adnexal pain and tenderness.  EXAM: TRANSABDOMINAL AND TRANSVAGINAL ULTRASOUND OF PELVIS  DOPPLER ULTRASOUND OF OVARIES  TECHNIQUE:  Both transabdominal and transvaginal ultrasound examinations of the pelvis were performed. Transabdominal technique was performed for global imaging of the pelvis including uterus, ovaries, adnexal regions, and pelvic cul-de-sac.  It was necessary to proceed with endovaginal exam following the transabdominal exam to visualize the ovaries and adnexa. Color and duplex Doppler ultrasound was utilized to evaluate blood flow to the ovaries.  COMPARISON:  CT scan dated 01/23/2014  FINDINGS: Uterus  Measurements: 8.2 x 3.7 x 5.5 cm. No fibroids or other mass visualized.  Endometrium  Thickness: 10.7 mm. 3 mm cystic area in the endometrium of unknown etiology. The patient has a negative pregnancy test.  Right ovary  Measurements: 3.4 x 2.2 x 2.7 cm. The adnexal tissues around the right ovary are slightly prominent. There is normal arterial and venous perfusion to the right ovary. The patient was quite tender in the right adnexal region.  Left ovary  Measurements: 2.1 x 1.3 x 1.7 cm. Normal appearance/no adnexal mass. There is normal arterial and venous perfusion to the left ovary.  Pulsed Doppler evaluation of both ovaries demonstrates normal low-resistance arterial and venous waveforms.  Other findings  Small amount of free fluid in the pelvis for.  IMPRESSION: Nonspecific soft tissue prominence around the right ovary suggesting inflammation or infection. Normal perfusion to the ovaries. Small amount of free fluid in the pelvis.   Electronically Signed   By: Rozetta Nunnery M.D.   On: 01/23/2014 10:42   Medications: I have reviewed the patient's current medications. Scheduled Meds: . cefOXitin  1 g Intravenous Q8H  . doxycycline (VIBRAMYCIN) IV  100 mg Intravenous Q12H  . heparin subcutaneous  5,000 Units Subcutaneous Q8H  . [START ON 01/25/2014] influenza vac split quadrivalent PF  0.5 mL Intramuscular Tomorrow-1000  . levothyroxine  150 mcg Oral QAC breakfast  . pantoprazole (PROTONIX) IV  40 mg Intravenous Q24H    Continuous Infusions: . dextrose 5 % and 0.9% NaCl 125 mL/hr at 01/24/14 0853   PRN Meds:.morphine injection Assessment/Plan:  Assessment: 43 year old woman with past medical history of achalasia s/p surgery and anemia who presented on 1/30 with RLQ abdominal pain and found to have leukocytosis with possible right ovary infection vs ovarian cyst vs acute appendicitis.    Pelvic Inflammatory Disease vs ruptured hemorrhagic ovarian cyst vs acute appendicitis - Pt with cervical motion tenderness and adnexal tenderness with leukocytosis, RLQ pain, and imaging indicating right ovary infection vs inflammation concerning for PID. Hemorrhagic ovarian cyst rupture also possible. Acute appendicitis also likely considering location with low grade fever, leukocytosis, and vomiting on admission. UA without evidence of infection.     -Maintain NPO and advance as tolerated, today to clear liquids and D5-NS at 50 mL/hr (consider d/c IVF's if adequate fluid intake)  -Awaiting GC/chlamydia probe -Obtain wet prep --> negative -Monitor leukocytosis (14.3K to 8.2) --> resolved -Obtain HIV Ab--> NR -Blood cultures x 2 (after antibiotics) --> NGTD  -Consider ESR, CRP levels, elevated in PID  -Continue Day 2 IV doxycycline and cefoxitin, consider d/c IV ceftoxin  24 hrs after clinical improvement and transition to PO doxycyline (100 mg BID) to complete 14 day course -IV morphine 2 mg Q 2hr for pain, IV zofran 4 mg Q 6 hr PRN for nausea -Ambulate every shift  -Appreciate surgery and OBGYN recommendations ---> repat CT with contrast if not improving or repeating US (vaginal only) tomm  Hypothyroidism - TSH 20.225 H, free T4 0.66 L -Start levothyroxine 150 mcg daily, recheck TSH in 6 weeks for further  adjustment  Microcytic/Normocytic Anemia -  Currently with no active bleeding or hemodynamic instability. Pt with Hg of 11.7 on admission.   -Obtain anemia panel ---> Iron (24 L), ferritin (20), TIBC (wnl), Iron sat  (6 L), folate( wnl), B12 -Monitor CBC  -Monitor for bleeding in setting of possible hemorrhagic ovarian cyst  GERD - currently without symptoms of acid reflux. -Protonix 40 mg daily   Hypokalemia - resolved. Pt presented with potassium level of 3.6 that normalized with supplementation.  -Continue to monitor  -Replete with KCl as needed  Anion Gap - resolved. AG 10. Pt presented with AG of 21 with elevated lactic acid (2.29) and ketonuria (40).  -Obtain HbA1c -Monitor lactic acid --> wnl (0.7)   Diet: Clear liquids DVT PPx: SQ Heparin Code: not on file  Dispo: Disposition is deferred at this time, awaiting improvement of current medical problems.  Anticipated discharge in approximately 3-4 day(s).   The patient does not have a current PCP (No Pcp Per Patient) and does need an [facility] hospital follow-up appointment after discharge.  The patient does not have transportation limitations that hinder transportation to clinic appointments.  .Services Needed at time of discharge: Y = Yes, Blank = No PT:   OT:   RN:   Equipment:   Other:     LOS: 1 day   Juluis Mire, MD 01/24/2014, 12:58 PM

## 2014-01-24 NOTE — Progress Notes (Signed)
Patient meets for inpatient status. Spoke with Vivi BarrackSarah Cater, MD who gave orders for admit to inpatient- [facility]. Orders entered.

## 2014-01-24 NOTE — Progress Notes (Signed)
Utilization review completed.  P.J. Kimm Ungaro,RN,BSN Case Manager [phone]  

## 2014-01-24 NOTE — Progress Notes (Signed)
Subjective: Patient reports continued pain diffusely with focused pain in RLQ.  Pt has not had any recent N/V/D.  Objective: I have reviewed patient's vital signs, medications, labs and radiology results.  General: alert, cooperative and no distress GI: soft, tender in all quadrants, no guarding.  When distracted, pain is less. Extremities: Homans sign is negative, no sign of DVT Back:  No CVA tenderness   Assessment/Plan: 43 yo female with pelvic pain; differential diagnosis is hemorrhagic cyst, PID, or appendicitis. 1-WBC and temperature have normalized 2-Pt is hypothyroid and anemic.  Medicine to address with supplements.  Hgb dropped while in hospital but most likely due to hydration (pt was having profuse diarrhea and vomiting prior to arrival).  Recheck cbc tomorrow 3-Continue antibiotics.  Can consider rpt CT with contrast if not improving or repeating US (vaginal only).    LOS: 1 day    LEGGETT,KELLY H. 01/24/2014, 8:17 AM

## 2014-01-24 NOTE — Progress Notes (Signed)
Subjective: Pain a little better.  Having some heartburn.  Objective: Vital signs in last 24 hours: Temp:  [98.4 F (36.9 C)-100 F (37.8 C)] 98.4 F (36.9 C) (01/31 0537) Pulse Rate:  [75-95] 75 (01/31 0537) Resp:  [16-18] 17 (01/31 0537) BP: (116-144)/(66-83) 125/83 mmHg (01/31 0537) SpO2:  [99 %-100 %] 100 % (01/31 0537) Weight:  [174 lb (78.926 kg)] 174 lb (78.926 kg) (01/30 1347) Last BM Date: 01/22/14  Intake/Output from previous day: 01/30 0701 - 01/31 0700 In: 1411 [I.V.:911; IV Piggyback:500] Out: 1550 [Urine:1550] Intake/Output this shift:    PE: General- In NAD Abdomen-soft, mild diffuse tenderness, point of maximum tenderness is right pelvic area just lateral to pubis  Lab Results:   Recent Labs  01/23/14 1059 01/24/14 0525  WBC 11.7* 8.2  HGB 10.0* 9.5*  HCT 31.6* 30.4*  PLT 265 257   BMET  Recent Labs  01/23/14 0419 01/24/14 0525  NA 139 142  K 3.6* 3.8  CL 102 108  CO2 16* 24  GLUCOSE 137* 99  BUN 11 4*  CREATININE 0.71 0.71  CALCIUM 9.3 8.6   PT/INR  Recent Labs  01/23/14 1600  LABPROT 13.7  INR 1.07   Comprehensive Metabolic Panel:    Component Value Date/Time   NA 142 01/24/2014 0525   NA 139 01/23/2014 0419   K 3.8 01/24/2014 0525   K 3.6* 01/23/2014 0419   CL 108 01/24/2014 0525   CL 102 01/23/2014 0419   CO2 24 01/24/2014 0525   CO2 16* 01/23/2014 0419   BUN 4* 01/24/2014 0525   BUN 11 01/23/2014 0419   CREATININE 0.71 01/24/2014 0525   CREATININE 0.71 01/23/2014 0419   GLUCOSE 99 01/24/2014 0525   GLUCOSE 137* 01/23/2014 0419   CALCIUM 8.6 01/24/2014 0525   CALCIUM 9.3 01/23/2014 0419   AST 22 01/23/2014 0419   ALT 13 01/23/2014 0419   ALKPHOS 78 01/23/2014 0419   BILITOT 0.4 01/23/2014 0419   PROT 7.8 01/23/2014 0419   ALBUMIN 4.0 01/23/2014 0419     Studies/Results: Ct Abdomen Pelvis Wo Contrast  01/23/2014   CLINICAL DATA:  Right flank pain  EXAM: CT ABDOMEN AND PELVIS WITHOUT CONTRAST  TECHNIQUE: Multidetector CT  imaging of the abdomen and pelvis was performed following the standard protocol without intravenous contrast.  COMPARISON:  None.  FINDINGS: Linear right middle lobe opacity is favored to reflect atelectasis or scarring. Heart size within normal limits. Distal esophageal wall thickening and small hiatal hernia.  Organ evaluation is limited without intravenous contrast. Within this limitation, no appreciable abnormality of the liver, biliary system, pancreas, spleen, adrenal glands.  Symmetric renal size. No hydroureteronephrosis no definite urinary tract calculi. Unable to follow the ureteral course in their entirety given the decompressed state.  Colonic diverticulosis. No CT evidence for colitis or diverticulitis. Appendix not identified. Small bowel loops are of normal course and caliber. No free intraperitoneal air. No lymphadenopathy.  Normal caliber aorta and branch vessels.  Partially decompressed bladder. Nonspecific appearance to the uterus and adnexa. There is mild stranding in the right adnexa and small amount of associated free fluid.  No acute osseous finding.  IMPRESSION: No hydronephrosis or urinary tract calculi identified.  Appendix not identified.  Mild right adnexal stranding/fluid may be physiologic. Correlate with pelvic ultrasound if concerned for acute pathology.  Distal esophageal wall thickening may reflect esophagitis or occurred.   Electronically Signed   By: Jearld Lesch M.D.   On: 01/23/2014 06:24   US  Transvaginal Non-ob  01/23/2014   CLINICAL DATA:  Severe right adnexal pain and tenderness.  EXAM: TRANSABDOMINAL AND TRANSVAGINAL ULTRASOUND OF PELVIS  DOPPLER ULTRASOUND OF OVARIES  TECHNIQUE: Both transabdominal and transvaginal ultrasound examinations of the pelvis were performed. Transabdominal technique was performed for global imaging of the pelvis including uterus, ovaries, adnexal regions, and pelvic cul-de-sac.  It was necessary to proceed with endovaginal exam following  the transabdominal exam to visualize the ovaries and adnexa. Color and duplex Doppler ultrasound was utilized to evaluate blood flow to the ovaries.  COMPARISON:  CT scan dated 01/23/2014  FINDINGS: Uterus  Measurements: 8.2 x 3.7 x 5.5 cm. No fibroids or other mass visualized.  Endometrium  Thickness: 10.7 mm. 3 mm cystic area in the endometrium of unknown etiology. The patient has a negative pregnancy test.  Right ovary  Measurements: 3.4 x 2.2 x 2.7 cm. The adnexal tissues around the right ovary are slightly prominent. There is normal arterial and venous perfusion to the right ovary. The patient was quite tender in the right adnexal region.  Left ovary  Measurements: 2.1 x 1.3 x 1.7 cm. Normal appearance/no adnexal mass. There is normal arterial and venous perfusion to the left ovary.  Pulsed Doppler evaluation of both ovaries demonstrates normal low-resistance arterial and venous waveforms.  Other findings  Small amount of free fluid in the pelvis for.  IMPRESSION: Nonspecific soft tissue prominence around the right ovary suggesting inflammation or infection. Normal perfusion to the ovaries. Small amount of free fluid in the pelvis.   Electronically Signed   By: Geanie Cooley M.D.   On: 01/23/2014 10:42   US Pelvis Complete  01/23/2014   CLINICAL DATA:  Severe right adnexal pain and tenderness.  EXAM: TRANSABDOMINAL AND TRANSVAGINAL ULTRASOUND OF PELVIS  DOPPLER ULTRASOUND OF OVARIES  TECHNIQUE: Both transabdominal and transvaginal ultrasound examinations of the pelvis were performed. Transabdominal technique was performed for global imaging of the pelvis including uterus, ovaries, adnexal regions, and pelvic cul-de-sac.  It was necessary to proceed with endovaginal exam following the transabdominal exam to visualize the ovaries and adnexa. Color and duplex Doppler ultrasound was utilized to evaluate blood flow to the ovaries.  COMPARISON:  CT scan dated 01/23/2014  FINDINGS: Uterus  Measurements: 8.2 x 3.7 x  5.5 cm. No fibroids or other mass visualized.  Endometrium  Thickness: 10.7 mm. 3 mm cystic area in the endometrium of unknown etiology. The patient has a negative pregnancy test.  Right ovary  Measurements: 3.4 x 2.2 x 2.7 cm. The adnexal tissues around the right ovary are slightly prominent. There is normal arterial and venous perfusion to the right ovary. The patient was quite tender in the right adnexal region.  Left ovary  Measurements: 2.1 x 1.3 x 1.7 cm. Normal appearance/no adnexal mass. There is normal arterial and venous perfusion to the left ovary.  Pulsed Doppler evaluation of both ovaries demonstrates normal low-resistance arterial and venous waveforms.  Other findings  Small amount of free fluid in the pelvis for.  IMPRESSION: Nonspecific soft tissue prominence around the right ovary suggesting inflammation or infection. Normal perfusion to the ovaries. Small amount of free fluid in the pelvis.   Electronically Signed   By: Geanie Cooley M.D.   On: 01/23/2014 10:42   [place] Art/ven Flow Abd Pelv Doppler  01/23/2014   CLINICAL DATA:  Severe right adnexal pain and tenderness.  EXAM: TRANSABDOMINAL AND TRANSVAGINAL ULTRASOUND OF PELVIS  DOPPLER ULTRASOUND OF OVARIES  TECHNIQUE: Both transabdominal and transvaginal ultrasound  examinations of the pelvis were performed. Transabdominal technique was performed for global imaging of the pelvis including uterus, ovaries, adnexal regions, and pelvic cul-de-sac.  It was necessary to proceed with endovaginal exam following the transabdominal exam to visualize the ovaries and adnexa. Color and duplex Doppler ultrasound was utilized to evaluate blood flow to the ovaries.  COMPARISON:  CT scan dated 01/23/2014  FINDINGS: Uterus  Measurements: 8.2 x 3.7 x 5.5 cm. No fibroids or other mass visualized.  Endometrium  Thickness: 10.7 mm. 3 mm cystic area in the endometrium of unknown etiology. The patient has a negative pregnancy test.  Right ovary  Measurements: 3.4 x 2.2 x  2.7 cm. The adnexal tissues around the right ovary are slightly prominent. There is normal arterial and venous perfusion to the right ovary. The patient was quite tender in the right adnexal region.  Left ovary  Measurements: 2.1 x 1.3 x 1.7 cm. Normal appearance/no adnexal mass. There is normal arterial and venous perfusion to the left ovary.  Pulsed Doppler evaluation of both ovaries demonstrates normal low-resistance arterial and venous waveforms.  Other findings  Small amount of free fluid in the pelvis for.  IMPRESSION: Nonspecific soft tissue prominence around the right ovary suggesting inflammation or infection. Normal perfusion to the ovaries. Small amount of free fluid in the pelvis.   Electronically Signed   By: Geanie CooleyJim  Maxwell M.D.   On: 01/23/2014 10:42    Anti-infectives: Anti-infectives   Start     Dose/Rate Route Frequency Ordered Stop   01/23/14 1500  cefOXitin (MEFOXIN) 1 g in dextrose 5 % 50 mL IVPB     1 g 100 mL/hr over 30 Minutes Intravenous 3 times per day 01/23/14 1357     01/23/14 0845  cefTRIAXone (ROCEPHIN) 1 g in dextrose 5 % 50 mL IVPB     1 g 100 mL/hr over 30 Minutes Intravenous  Once 01/23/14 0844 01/23/14 1001   01/23/14 0845  doxycycline (VIBRAMYCIN) 100 mg in dextrose 5 % 250 mL IVPB     100 mg 125 mL/hr over 120 Minutes Intravenous Every 12 hours 01/23/14 0844        Assessment   Abdominal pain-DDx includes ruptured hemorrhagic ovarian cyst (seems to be the most likely cause at this point), PID, appendicitis-WBC normal, afebrile, on abxs for possible PID   Anemia   GERD (gastroesophageal reflux disease)    LOS: 1 day   Plan:  Try clear liquid diet.  Ambulate.  Repeat CBC in AM.   Annette Anderson J 01/24/2014

## 2014-01-24 NOTE — Progress Notes (Signed)
S: Called to patient bed, nurse requesting GERD meds. Patient complains of RLQ pain. The pain has spread to more generalized lower abdomen. No BM. Passing gas. No blood per rectum. No vaginal bleeding.   O: VSS, mild diffuse abdominal tenderness that is most evident in the RLQ. No guarding or rebound. No distension. No evidence of a surgical abdomen at this time.  A+P: The patients worsening abdominal pain is concerning. However, the patient's PE is reassuring. The patient has mild lactic acidosis on arrival to hospital yesterday. Chart review reveals concern for PID and possibly ruptured ovarian cyst. Both of these diagnosis are likely to produce the patients current symptoms.  Patient is on appropriate ABX for PID. Plan to recheck CBC and lactic acid.

## 2014-01-24 NOTE — H&P (Signed)
Date: 01/24/2014  Patient name: Annette Anderson  Medical record number: [id number]  Date of birth: 1971-03-02   I have seen and evaluated Annette Anderson and discussed their care with the Residency Team.  Briefly, Ms. Tsai is a 43yo woman who presented with sudden onset RLQ abdominal pain, 10/10, sharp.  Similar to pain she has during her menstrual cycle but much worse.  Associated symptoms included low grade fever, some nausea and vomiting without hematemesis.  On the first day of the pain she also had some diarrhea which has resolved.  She took ibuprofen without help and due to the persistence of the pain, presented to the hospital.  She had a pelvic exam in the ED and did have + CMT.  She had imaging including a CT of the abdomen and pelvic ultrasound as documented in Dr. Evorn Gong note.   Assessment and Plan: I have seen and evaluated the patient as outlined above. I agree with the formulated Assessment and Plan as detailed in the residents' admission note, with the following changes:   1. Presumed PID with elevated AG and lactic acidosis - She is currently on cefotoxitin and doxycycline, will need to define a course of therapy - BC pending, GC/Chlamydia pending - OB/Gyn following - General surgery also saw and thought a hemorraghic ovarian cyst was likely, will offer adequate pain control - NPO with trial of clears as tolerated, IVF as noted - Zofran for nausea, morphine for pain - checking TSH and free T4  2. H/O Anemia - Will check anemia panel  3. Lactic Acidosis - Treatment of acute infection as noted above, monitor BMP in the AM and lactic acid for resolution with therapy.  If no improvement, will need to evaluate further   Other issues per resident note.   Inez Catalina, MD 1/31/20158:58 AM

## 2014-01-25 ENCOUNTER — Encounter (HOSPITAL_COMMUNITY): Payer: Self-pay | Admitting: *Deleted

## 2014-01-25 ENCOUNTER — Inpatient Hospital Stay (HOSPITAL_COMMUNITY): Payer: Self-pay

## 2014-01-25 DIAGNOSIS — R1031 Right lower quadrant pain: Secondary | ICD-10-CM

## 2014-01-25 DIAGNOSIS — N730 Acute parametritis and pelvic cellulitis: Secondary | ICD-10-CM | POA: Diagnosis present

## 2014-01-25 LAB — CBC
HCT: 29.1 % — ABNORMAL LOW (ref 36.0–46.0)
Hemoglobin: 9 g/dL — ABNORMAL LOW (ref 12.0–15.0)
MCH: 24.9 pg — AB (ref 26.0–34.0)
MCHC: 30.9 g/dL (ref 30.0–36.0)
MCV: 80.6 fL (ref 78.0–100.0)
Platelets: 258 10*3/uL (ref 150–400)
RBC: 3.61 MIL/uL — AB (ref 3.87–5.11)
RDW: 17.6 % — AB (ref 11.5–15.5)
WBC: 6.8 10*3/uL (ref 4.0–10.5)

## 2014-01-25 LAB — HEMOGLOBIN A1C
Hgb A1c MFr Bld: 5.4 % (ref <5.7)
Mean Plasma Glucose: 108 mg/dL (ref <117)

## 2014-01-25 MED ORDER — FERROUS SULFATE 325 (65 FE) MG PO TABS: 325.0000 mg | ORAL_TABLET | Freq: Three times a day (TID) | ORAL | Status: DC

## 2014-01-25 MED ORDER — METRONIDAZOLE IN NACL 5-0.79 MG/ML-% IV SOLN
500.0000 mg | Freq: Two times a day (BID) | INTRAVENOUS | Status: DC
  Administered 2014-01-25 – 2014-01-26 (×2): 500 mg via INTRAVENOUS
  Filled 2014-01-25 (×4): qty 100

## 2014-01-25 MED ORDER — IOHEXOL 300 MG/ML  SOLN
100.0000 mL | Freq: Once | INTRAMUSCULAR | Status: AC | PRN
  Administered 2014-01-25: 100 mL via INTRAVENOUS

## 2014-01-25 MED ORDER — IOHEXOL 300 MG/ML  SOLN
25.0000 mL | INTRAMUSCULAR | Status: AC
  Administered 2014-01-25: 25 mL via ORAL

## 2014-01-25 NOTE — Progress Notes (Addendum)
Patient ID: Annette Anderson, female   DOB: Feb 21, 1971, 43 y.o.   MRN: [id number] Subjective: Patient reports continued pain diffusely with focused pain in RLQ.  She was able to tolerate clear liquid diet yesterday without nausea or emesis. Patient reports pain is slowly improving and is looking forward to going home. She also understands that we are "trying to figure out what is wrong"  Objective: I have reviewed patient's vital signs, medications, labs and radiology results.  General: alert, cooperative and no distress GI: soft, tender in all quadrants, no guarding, no rebound.  Patient can be distracted during exam without any pain complaints. Extremities: Homans sign is negative, no sign of DVT and no edema, redness or tenderness in the calves or thighs Back:  No CVA tenderness   Assessment/Plan: 43 yo female with pelvic pain; differential diagnosis is hemorrhagic cyst, PID, or appendicitis. 1- Normal WBC and afebrile for greater than 24 hours 2- Will treat empirically for PID with a 14-day course of doxycycline 100mg  BID and Flagyl 500mg  BID. [facility]. Pain management prn An appointment will be scheduled for her but please provide clinic number in the event that the patient does not receive a call by Tuesday [phone]) 3- Re-consult if further imaging reveals new findings     LOS: 2 days    Clell Trahan 01/25/2014, 8:40 AM

## 2014-01-25 NOTE — Progress Notes (Signed)
CT reviewed with radiologist. No periappendiceal inflammation or other indicator of appendicitis.  Pelvic abscess and inflammation indicative of PID.  Will defer to Gyn but not general surgical issue.  Diet per primary team and will follow along as needed. May want IR / gyn to weigh in on need for perc drain.

## 2014-01-25 NOTE — Progress Notes (Signed)
Subjective: Maybe a little better wrt pain.  Passing some gas.  Objective: Vital signs in last 24 hours: Temp:  [98.5 F (36.9 C)-100.1 F (37.8 C)] 98.5 F (36.9 C) (02/01 0541) Pulse Rate:  [79-99] 79 (02/01 0541) Resp:  [18] 18 (02/01 0541) BP: (120-152)/(72-87) 120/72 mmHg (02/01 0541) SpO2:  [98 %-100 %] 100 % (02/01 0541) Last BM Date: 01/24/14  Intake/Output from previous day: 01/31 0701 - 02/01 0700 In: 2519.2 [P.O.:800; I.V.:1469.2; IV Piggyback:250] Out: 2076 [Urine:2075; Stool:1] Intake/Output this shift:    PE: General- In NAD Abdomen-soft, no change in tenderness vs yesterday  Lab Results:   Recent Labs  01/24/14 0525 01/25/14 0340  WBC 8.2 6.8  HGB 9.5* 9.0*  HCT 30.4* 29.1*  PLT 257 258   BMET  Recent Labs  01/23/14 0419 01/24/14 0525  NA 139 142  K 3.6* 3.8  CL 102 108  CO2 16* 24  GLUCOSE 137* 99  BUN 11 4*  CREATININE 0.71 0.71  CALCIUM 9.3 8.6   PT/INR  Recent Labs  01/23/14 1600  LABPROT 13.7  INR 1.07   Comprehensive Metabolic Panel:    Component Value Date/Time   NA 142 01/24/2014 0525   NA 139 01/23/2014 0419   K 3.8 01/24/2014 0525   K 3.6* 01/23/2014 0419   CL 108 01/24/2014 0525   CL 102 01/23/2014 0419   CO2 24 01/24/2014 0525   CO2 16* 01/23/2014 0419   BUN 4* 01/24/2014 0525   BUN 11 01/23/2014 0419   CREATININE 0.71 01/24/2014 0525   CREATININE 0.71 01/23/2014 0419   GLUCOSE 99 01/24/2014 0525   GLUCOSE 137* 01/23/2014 0419   CALCIUM 8.6 01/24/2014 0525   CALCIUM 9.3 01/23/2014 0419   AST 22 01/23/2014 0419   ALT 13 01/23/2014 0419   ALKPHOS 78 01/23/2014 0419   BILITOT 0.4 01/23/2014 0419   PROT 7.8 01/23/2014 0419   ALBUMIN 4.0 01/23/2014 0419     Studies/Results: US Transvaginal Non-ob  01/23/2014   CLINICAL DATA:  Severe right adnexal pain and tenderness.  EXAM: TRANSABDOMINAL AND TRANSVAGINAL ULTRASOUND OF PELVIS  DOPPLER ULTRASOUND OF OVARIES  TECHNIQUE: Both transabdominal and transvaginal ultrasound  examinations of the pelvis were performed. Transabdominal technique was performed for global imaging of the pelvis including uterus, ovaries, adnexal regions, and pelvic cul-de-sac.  It was necessary to proceed with endovaginal exam following the transabdominal exam to visualize the ovaries and adnexa. Color and duplex Doppler ultrasound was utilized to evaluate blood flow to the ovaries.  COMPARISON:  CT scan dated 01/23/2014  FINDINGS: Uterus  Measurements: 8.2 x 3.7 x 5.5 cm. No fibroids or other mass visualized.  Endometrium  Thickness: 10.7 mm. 3 mm cystic area in the endometrium of unknown etiology. The patient has a negative pregnancy test.  Right ovary  Measurements: 3.4 x 2.2 x 2.7 cm. The adnexal tissues around the right ovary are slightly prominent. There is normal arterial and venous perfusion to the right ovary. The patient was quite tender in the right adnexal region.  Left ovary  Measurements: 2.1 x 1.3 x 1.7 cm. Normal appearance/no adnexal mass. There is normal arterial and venous perfusion to the left ovary.  Pulsed Doppler evaluation of both ovaries demonstrates normal low-resistance arterial and venous waveforms.  Other findings  Small amount of free fluid in the pelvis for.  IMPRESSION: Nonspecific soft tissue prominence around the right ovary suggesting inflammation or infection. Normal perfusion to the ovaries. Small amount of free fluid in the pelvis.  Electronically Signed   By: Geanie CooleyJim  Maxwell M.D.   On: 01/23/2014 10:42   [place] Pelvis Complete  01/23/2014   CLINICAL DATA:  Severe right adnexal pain and tenderness.  EXAM: TRANSABDOMINAL AND TRANSVAGINAL ULTRASOUND OF PELVIS  DOPPLER ULTRASOUND OF OVARIES  TECHNIQUE: Both transabdominal and transvaginal ultrasound examinations of the pelvis were performed. Transabdominal technique was performed for global imaging of the pelvis including uterus, ovaries, adnexal regions, and pelvic cul-de-sac.  It was necessary to proceed with endovaginal exam  following the transabdominal exam to visualize the ovaries and adnexa. Color and duplex Doppler ultrasound was utilized to evaluate blood flow to the ovaries.  COMPARISON:  CT scan dated 01/23/2014  FINDINGS: Uterus  Measurements: 8.2 x 3.7 x 5.5 cm. No fibroids or other mass visualized.  Endometrium  Thickness: 10.7 mm. 3 mm cystic area in the endometrium of unknown etiology. The patient has a negative pregnancy test.  Right ovary  Measurements: 3.4 x 2.2 x 2.7 cm. The adnexal tissues around the right ovary are slightly prominent. There is normal arterial and venous perfusion to the right ovary. The patient was quite tender in the right adnexal region.  Left ovary  Measurements: 2.1 x 1.3 x 1.7 cm. Normal appearance/no adnexal mass. There is normal arterial and venous perfusion to the left ovary.  Pulsed Doppler evaluation of both ovaries demonstrates normal low-resistance arterial and venous waveforms.  Other findings  Small amount of free fluid in the pelvis for.  IMPRESSION: Nonspecific soft tissue prominence around the right ovary suggesting inflammation or infection. Normal perfusion to the ovaries. Small amount of free fluid in the pelvis.   Electronically Signed   By: Geanie CooleyJim  Maxwell M.D.   On: 01/23/2014 10:42   [place] Art/ven Flow Abd Pelv Doppler  01/23/2014   CLINICAL DATA:  Severe right adnexal pain and tenderness.  EXAM: TRANSABDOMINAL AND TRANSVAGINAL ULTRASOUND OF PELVIS  DOPPLER ULTRASOUND OF OVARIES  TECHNIQUE: Both transabdominal and transvaginal ultrasound examinations of the pelvis were performed. Transabdominal technique was performed for global imaging of the pelvis including uterus, ovaries, adnexal regions, and pelvic cul-de-sac.  It was necessary to proceed with endovaginal exam following the transabdominal exam to visualize the ovaries and adnexa. Color and duplex Doppler ultrasound was utilized to evaluate blood flow to the ovaries.  COMPARISON:  CT scan dated 01/23/2014  FINDINGS: Uterus   Measurements: 8.2 x 3.7 x 5.5 cm. No fibroids or other mass visualized.  Endometrium  Thickness: 10.7 mm. 3 mm cystic area in the endometrium of unknown etiology. The patient has a negative pregnancy test.  Right ovary  Measurements: 3.4 x 2.2 x 2.7 cm. The adnexal tissues around the right ovary are slightly prominent. There is normal arterial and venous perfusion to the right ovary. The patient was quite tender in the right adnexal region.  Left ovary  Measurements: 2.1 x 1.3 x 1.7 cm. Normal appearance/no adnexal mass. There is normal arterial and venous perfusion to the left ovary.  Pulsed Doppler evaluation of both ovaries demonstrates normal low-resistance arterial and venous waveforms.  Other findings  Small amount of free fluid in the pelvis for.  IMPRESSION: Nonspecific soft tissue prominence around the right ovary suggesting inflammation or infection. Normal perfusion to the ovaries. Small amount of free fluid in the pelvis.   Electronically Signed   By: Geanie CooleyJim  Maxwell M.D.   On: 01/23/2014 10:42   Dg Abd 2 Views  01/24/2014   CLINICAL DATA:  Right flank pain which began 2 days ago.  Nausea and vomiting. Diarrhea.  EXAM: ABDOMEN - 2 VIEW  COMPARISON:  US PELVIS COMPLETE with US TRANSVAGINAL NON-OB dated 01/23/2014; CT ABD/PELV WO CM dated 01/23/2014  FINDINGS: Bowel gas pattern unremarkable without evidence of obstruction or significant ileus. No evidence of free air or significant air-fluid levels on the erect image. No visible opaque urinary tract calculi. Phleboliths low in both sides of the pelvis. Regional skeleton unremarkable.  IMPRESSION: No acute abdominal abnormality.   Electronically Signed   By: Hulan Saas M.D.   On: 01/24/2014 13:57    Anti-infectives: Anti-infectives   Start     Dose/Rate Route Frequency Ordered Stop   01/23/14 1500  cefOXitin (MEFOXIN) 1 g in dextrose 5 % 50 mL IVPB     1 g 100 mL/hr over 30 Minutes Intravenous 3 times per day 01/23/14 1357     01/23/14 0845   cefTRIAXone (ROCEPHIN) 1 g in dextrose 5 % 50 mL IVPB     1 g 100 mL/hr over 30 Minutes Intravenous  Once 01/23/14 0844 01/23/14 1001   01/23/14 0845  doxycycline (VIBRAMYCIN) 100 mg in dextrose 5 % 250 mL IVPB     100 mg 125 mL/hr over 120 Minutes Intravenous Every 12 hours 01/23/14 0844        Assessment Abdominal pain-persist; DDx includes ruptured hemorrhagic ovarian cyst (seems to be the most likely cause at this point), PID, appendicitis-clinical exam unchanged. Anemia  GERD (gastroesophageal reflux disease)     LOS: 2 days   Plan: CT of abdomen and pelvis with contrast this time.   Mardelle Pandolfi J 01/25/2014

## 2014-01-25 NOTE — Progress Notes (Signed)
Subjective: Pt seen and examined this morning. Pt still with RLQ abdominal pain and suprapubic pain that has not improved. She reports no fever, chills, nausea, vomiting, or change in BM. She reports she is passing gas normally and had a bowel movement yesterday. She reports pressure when she urinates with decreased flow but no dysuria or burning. No cervical discharge or vaginal pain.      Objective: Vital signs in last 24 hours: Filed Vitals:   01/24/14 1450 01/24/14 2241 01/25/14 0541 01/25/14 1420  BP: 141/87 152/74 120/72 132/89  Pulse: 82 99 79 84  Temp: 98.8 F (37.1 C) 100.1 F (37.8 C) 98.5 F (36.9 C) 99.5 F (37.5 C)  TempSrc: Oral Oral  Oral  Resp: 18 18 18 18   Height:      Weight:      SpO2: 100% 98% 100% 100%   Weight change:   Intake/Output Summary (Last 24 hours) at 01/25/14 1552 Last data filed at 01/25/14 1426  Gross per 24 hour  Intake 1211.67 ml  Output   1175 ml  Net  36.67 ml   General: NAD, pleasant HEENT: EOMI, exophthalmus b/l R>L  Cardiac: RRR, No murmurs, gallops or rubs  Pulm: Good air movement bilaterally. Clear to auscultation bilaterally. No rales, wheezing, rhonchi or rubs.  Abd: Tenderness to palpation of RLQ and suprapubic region with rebound tenderness. Normal BS present.  Ext: No edema.   Musculoskeletal: No joint deformities, erythema, or stiffness, ROM full  Skin: No rashes.  Neuro: Alert and oriented X3.  Psych: Normal mood and affect  Lab Results: Basic Metabolic Panel:  Recent Labs Lab 01/23/14 0419 01/24/14 0525  NA 139 142  K 3.6* 3.8  CL 102 108  CO2 16* 24  GLUCOSE 137* 99  BUN 11 4*  CREATININE 0.71 0.71  CALCIUM 9.3 8.6   Liver Function Tests:  Recent Labs Lab 01/23/14 0419  AST 22  ALT 13  ALKPHOS 78  BILITOT 0.4  PROT 7.8  ALBUMIN 4.0    Recent Labs Lab 01/23/14 1600  LIPASE 29   CBC:  Recent Labs Lab 01/23/14 1059 01/24/14 0525 01/25/14 0340  WBC 11.7* 8.2 6.8  NEUTROABS 9.7* 5.3   --   HGB 10.0* 9.5* 9.0*  HCT 31.6* 30.4* 29.1*  MCV 78.8 80.9 80.6  PLT 265 257 258   Hemoglobin A1C:  Recent Labs Lab 01/25/14 0340  HGBA1C 5.4   Thyroid Function Tests:  Recent Labs Lab 01/23/14 1600  TSH 20.225*  FREET4 0.66*   Coagulation:  Recent Labs Lab 01/23/14 1600  LABPROT 13.7  INR 1.07   Anemia Panel:  Recent Labs Lab 01/23/14 1600  VITAMINB12 659  FOLATE 15.2  FERRITIN 20  TIBC 430  IRON 24*  RETICCTPCT 1.1   Urinalysis:  Recent Labs Lab 01/23/14 0533  COLORURINE YELLOW  LABSPEC 1.021  PHURINE 5.5  GLUCOSEU NEGATIVE  HGBUR NEGATIVE  BILIRUBINUR NEGATIVE  KETONESUR 40*  PROTEINUR NEGATIVE  UROBILINOGEN 0.2  NITRITE NEGATIVE  LEUKOCYTESUR NEGATIVE    Micro Results: Recent Results (from the past 240 hour(s))  GC/CHLAMYDIA PROBE AMP     Status: None   Collection Time    01/23/14  6:44 AM      Result Value Range Status   CT Probe RNA NEGATIVE  NEGATIVE Final   GC Probe RNA NEGATIVE  NEGATIVE Final   Comment: (NOTE)                                                                                               **  Normal Reference Range: Negative**          Assay performed using the Gen-Probe APTIMA COMBO2 (R) Assay.     Acceptable specimen types for this assay include APTIMA Swabs (Unisex,     endocervical, urethral, or vaginal), first void urine, and ThinPrep     liquid based cytology samples.     Performed at [facility]  WET PREP, GENITAL     Status: None   Collection Time    01/23/14  6:46 AM      Result Value Range Status   Yeast Wet Prep HPF POC NONE SEEN  NONE SEEN Final   Trich, Wet Prep NONE SEEN  NONE SEEN Final   Clue Cells Wet Prep HPF POC NONE SEEN  NONE SEEN Final   WBC, Wet Prep HPF POC NONE SEEN  NONE SEEN Final  CULTURE, BLOOD (ROUTINE X 2)     Status: None   Collection Time    01/23/14  6:40 PM      Result Value Range Status   Specimen Description BLOOD RIGHT ARM   Final   Special Requests BOTTLES  DRAWN AEROBIC AND ANAEROBIC 5CC   Final   Culture  Setup Time     Final   Value: 01/24/2014 02:05     Performed at [facility]   Culture     Final   Value:        BLOOD CULTURE RECEIVED NO GROWTH TO DATE CULTURE WILL BE HELD FOR 5 DAYS BEFORE ISSUING A FINAL NEGATIVE REPORT     Performed at [facility]   Report Status PENDING   Incomplete  CULTURE, BLOOD (ROUTINE X 2)     Status: None   Collection Time    01/23/14  6:50 PM      Result Value Range Status   Specimen Description BLOOD RIGHT HAND   Final   Special Requests BOTTLES DRAWN AEROBIC AND ANAEROBIC 10 CC   Final   Culture  Setup Time     Final   Value: 01/24/2014 02:04     Performed at [facility]   Culture     Final   Value:        BLOOD CULTURE RECEIVED NO GROWTH TO DATE CULTURE WILL BE HELD FOR 5 DAYS BEFORE ISSUING A FINAL NEGATIVE REPORT     Performed at [facility]   Report Status PENDING   Incomplete   Studies/Results: Ct Abdomen Pelvis W Contrast  01/25/2014   CLINICAL DATA:  Persistent right lower quadrant pain question appendicitis  EXAM: CT ABDOMEN AND PELVIS WITH CONTRAST  TECHNIQUE: Multidetector CT imaging of the abdomen and pelvis was performed using the standard protocol following bolus administration of intravenous contrast. Sagittal and coronal MPR images reconstructed from axial data set.  CONTRAST:  OMNIPAQUE IOHEXOL 300 MG/ML SOLN. Dilute oral contrast.  COMPARISON:  01/23/2014  FINDINGS: Lung bases clear.  Air and fluid within a minimally distended distal thoracic esophagus.  Small splenule anterior to spleen.  Liver, spleen, pancreas, kidneys, and adrenal glands normal appearance.  Unremarkable bladder in uterus.  Appendix is not localized.  No pericecal inflammatory process.  Inflammatory process identified in right adnexa and cul-de-sac with diffuse infiltration of fat planes.  Rounded fluid attenuation collection with enhancing margins is seen to cul de sac suspicious  for abscess, 4.8 x 3.4 x 2.6 cm.  Additional fluid collection is seen in the right adnexa, 3.8 x 2.8 x 4.3 cm with  linear central density, suspicious for a dilated fallopian tube looped on itself, less likely abscess.  These inflammatory changes do not extend to the cecum.  Areas of fat are seen in the adnexae which do not show infiltration, similar to previous exam, doubt fat containing masses of the ovaries such as dermoid tumor though not completely excluded since the ovaries are not visualized.  Sigmoid colon incompletely distended, limiting assessment of wall thickness.  Stomach and bowel loops otherwise normal appearance.  No mass, adenopathy, hernia, or acute osseous findings.  IMPRESSION: Appendix is not visualized but no definite pericecal inflammation is seen.  Pelvic inflammatory process in the right adnexa and cul-de-sac most suspicious for pelvic inflammatory disease with a cul de sac abscess as discussed above.  Findings called to Dr. Davina Poke on 02/14/2014 at 1335 hr.   Electronically Signed   By: Ulyses Southward M.D.   On: 01/25/2014 13:35   Dg Abd 2 Views  01/24/2014   CLINICAL DATA:  Right flank pain which began 2 days ago. Nausea and vomiting. Diarrhea.  EXAM: ABDOMEN - 2 VIEW  COMPARISON:  US PELVIS COMPLETE with US TRANSVAGINAL NON-OB dated 01/23/2014; CT ABD/PELV WO CM dated 01/23/2014  FINDINGS: Bowel gas pattern unremarkable without evidence of obstruction or significant ileus. No evidence of free air or significant air-fluid levels on the erect image. No visible opaque urinary tract calculi. Phleboliths low in both sides of the pelvis. Regional skeleton unremarkable.  IMPRESSION: No acute abdominal abnormality.   Electronically Signed   By: Hulan Saas M.D.   On: 01/24/2014 13:57   Medications: I have reviewed the patient's current medications. Scheduled Meds: . cefOXitin  1 g Intravenous Q8H  . doxycycline (VIBRAMYCIN) IV  100 mg Intravenous Q12H  . heparin subcutaneous  5,000 Units  Subcutaneous Q8H  . levothyroxine  150 mcg Oral QAC breakfast  . ondansetron (ZOFRAN) IV  4 mg Intravenous Q6H  . pantoprazole (PROTONIX) IV  40 mg Intravenous Q24H   Continuous Infusions: . dextrose 5 % and 0.9% NaCl 50 mL/hr at 01/25/14 1039   PRN Meds:.morphine injection Assessment/Plan: 43 year old woman with past medical history of achalasia s/p surgery and anemia who presented on 1/30 with RLQ abdominal pain and found to have leukocytosis with possible PID vs ovarian cyst vs acute appendicitis.   Pelvic Inflammatory Disease vs ruptured hemorrhagic ovarian cyst vs acute appendicitis: Pt with cervical motion tenderness and adnexal tenderness with leukocytosis, RLQ pain, and imaging indicating right ovary infection vs inflammation concerning for PID. Hemorrhagic ovarian cyst rupture also possible. Acute appendicitis also likely considering location with low grade fever, leukocytosis, and vomiting on admission. UA without evidence of infection. GC/Chlamydia, wet prep, and blood cultures negative. Treating empirically with Doxy and Flagyl x14 days for PID per OBGYN recommendations. General Surgery obtaining CT abdomen/pelvis w/ contrast to r/o appendicitis, as her pain has not improved. Although her leukocytosis has resolved. - F/u CT abd/pelvis - IV morphine 2 mg Q 2hr for pain, IV zofran 4 mg Q 6 hr PRN for nausea - Appreciate General Surgery and OBGYN recommendations   Hypothyroidism: TSH 20.225 H, free T4 0.66 Levothyroxine 150 mcg daily started on 1/31. - Continue Synthroid - Recheck TSH in 6 weeks for further adjustment of synthroid  Microcytic/Normocytic Anemia: Currently with no active bleeding or hemodynamic instability. Pt with Hg of 11.7 on admission. Anemia panel  With iron (24 L), ferritin (20), TIBC (wnl), Iron sat (6 L), folate( wnl), B12. Possibly 2/2 intraabdominal process.  -Monitor CBC  GERD: Stable. Currently without symptoms of acid reflux. -Protonix 40 mg daily    Hypokalemia, mild: Resolved. Pt presented with potassium level of 3.6 that normalized with supplementation.   Anion Gap: Resolved. Pt presented with AG of 21 with elevated lactic acid (2.29) and ketonuria (40), likely from intraabdominal process. Lactic acid and AG resolved.  Diet: NPO for now DVT PPx: SQ Heparin Code: Full  Dispo: Disposition is deferred at this time, awaiting improvement of current medical problems.  Anticipated discharge in approximately 2-3 day(s).   The patient does not have a current PCP (No Pcp Per Patient) and does need an [facility] hospital follow-up appointment after discharge.  The patient does not have transportation limitations that hinder transportation to clinic appointments.  .Services Needed at time of discharge: Y = Yes, Blank = No PT:   OT:   RN:   Equipment:   Other:     LOS: 2 days   Genelle Gather, MD 01/25/2014, 3:52 PM

## 2014-01-26 DIAGNOSIS — N739 Female pelvic inflammatory disease, unspecified: Principal | ICD-10-CM

## 2014-01-26 MED ORDER — KETOROLAC TROMETHAMINE 15 MG/ML IJ SOLN
15.0000 mg | Freq: Four times a day (QID) | INTRAMUSCULAR | Status: DC | PRN
  Administered 2014-01-26: 15 mg via INTRAVENOUS
  Filled 2014-01-26 (×2): qty 1

## 2014-01-26 MED ORDER — DOXYCYCLINE HYCLATE 100 MG PO TABS: 100.0000 mg | ORAL_TABLET | Freq: Two times a day (BID) | ORAL | Status: DC

## 2014-01-26 MED ORDER — OXYCODONE-ACETAMINOPHEN 5-325 MG PO TABS
1.0000 | ORAL_TABLET | ORAL | Status: DC | PRN
  Administered 2014-01-26 (×3): 2 via ORAL
  Administered 2014-01-27: 1 via ORAL
  Administered 2014-01-27: 2 via ORAL
  Filled 2014-01-26 (×5): qty 2

## 2014-01-26 MED ORDER — AMOXICILLIN-POT CLAVULANATE 875-125 MG PO TABS
1.0000 | ORAL_TABLET | Freq: Two times a day (BID) | ORAL | Status: DC
  Administered 2014-01-26 – 2014-01-27 (×2): 1 via ORAL
  Filled 2014-01-26 (×4): qty 1

## 2014-01-26 MED ORDER — PANTOPRAZOLE SODIUM 40 MG PO TBEC
40.0000 mg | DELAYED_RELEASE_TABLET | Freq: Every day | ORAL | Status: DC
  Administered 2014-01-26: 40 mg via ORAL
  Filled 2014-01-26: qty 1

## 2014-01-26 MED ORDER — METRONIDAZOLE 500 MG PO TABS
500.0000 mg | ORAL_TABLET | Freq: Two times a day (BID) | ORAL | Status: DC
  Administered 2014-01-26 – 2014-01-27 (×2): 500 mg via ORAL
  Filled 2014-01-26 (×4): qty 1

## 2014-01-26 NOTE — Progress Notes (Signed)
Tearful,pain med ineffective,score remains 5-6.

## 2014-01-26 NOTE — Progress Notes (Signed)
Internal Medicine Attending  Date: 01/26/2014  Patient name: Annette AlbeeBridget Koppen Medical record number: [id number] Date of birth: 1971/09/02 Age: 43 y.o. Gender: female  I saw and evaluated the patient. I reviewed the resident's note by Dr. Yetta BarreJones and I agree with the resident's findings and plans as documented in his progress note.  [name] continues to have abdominal pain. Her exam is soft and relatively nontender. After review of the CT scan with both general surgery and OB/GYN the diagnosis at this point is felt to be pelvic inflammatory disease. As she is clinically stable GYN feels it is safe to discharge her home in the morning should she remain afebrile overnight. We will switch her to Augmentin as suggested and change her pain regimen to an oral formulation in hopes of getting a longer pain benefit over the shorter acting IV boluses. I anticipate she will be discharged home tomorrow morning.

## 2014-01-26 NOTE — Progress Notes (Signed)
Subjective: Patient examined at bedside this AM. Still complaining of "pressure sensation" w/ urination and mild suprapubic pain. Still not feeling dramatically improved from yesterday. Patient w/ nausea, well controlled w/ zofran. No further complaints. Seen by surgery yesterday, performed CT abdomen, findings suggestive of PID. D/w GYN today, feel that patient is stable to be discharged tomorrow w/ follow up in OB/GYN clinic.   Objective: Vital signs in last 24 hours: Filed Vitals:   01/25/14 1420 01/25/14 2157 01/26/14 0504 01/26/14 1429  BP: 132/89 135/87 134/77 142/93  Pulse: 84 82 79 82  Temp: 99.5 F (37.5 C) 100 F (37.8 C) 99 F (37.2 C) 99 F (37.2 C)  TempSrc: Oral Oral Oral Oral  Resp: 18 20 18 18   Height:      Weight:      SpO2: 100% 100% 100% 100%   Weight change:   Intake/Output Summary (Last 24 hours) at 01/26/14 1508 Last data filed at 01/26/14 1437  Gross per 24 hour  Intake   2333 ml  Output    850 ml  Net   1483 ml   General: Awake, alert, NAD. Cooperative with exam. HEENT: PERRL, EOMI, exophthalmos b/l R>L  Cardiac: RRR, No murmurs, gallops or rubs  Pulm: Good air movement bilaterally. No rales, wheezing, rhonchi or rubs.  Abd: Mild tenderness to palpation of lower quadrants/suprapubic region with mild rebound tenderness. BS + Ext: No swelling/edema.  Distal pulses intact.  Musculoskeletal: No joint deformities, erythema, or stiffness, ROM full  Skin: No rashes, erythema, or ulcerations Neuro: Alert and oriented X3. No focal sensory or focal abnormalities.  Psych: Normal mood and affect  Lab Results: Basic Metabolic Panel:  Recent Labs Lab 01/23/14 0419 01/24/14 0525  NA 139 142  K 3.6* 3.8  CL 102 108  CO2 16* 24  GLUCOSE 137* 99  BUN 11 4*  CREATININE 0.71 0.71  CALCIUM 9.3 8.6   Liver Function Tests:  Recent Labs Lab 01/23/14 0419  AST 22  ALT 13  ALKPHOS 78  BILITOT 0.4  PROT 7.8  ALBUMIN 4.0    Recent Labs Lab  01/23/14 1600  LIPASE 29   CBC:  Recent Labs Lab 01/23/14 1059 01/24/14 0525 01/25/14 0340  WBC 11.7* 8.2 6.8  NEUTROABS 9.7* 5.3  --   HGB 10.0* 9.5* 9.0*  HCT 31.6* 30.4* 29.1*  MCV 78.8 80.9 80.6  PLT 265 257 258   Hemoglobin A1C:  Recent Labs Lab 01/25/14 0340  HGBA1C 5.4   Thyroid Function Tests:  Recent Labs Lab 01/23/14 1600  TSH 20.225*  FREET4 0.66*   Coagulation:  Recent Labs Lab 01/23/14 1600  LABPROT 13.7  INR 1.07   Anemia Panel:  Recent Labs Lab 01/23/14 1600  VITAMINB12 659  FOLATE 15.2  FERRITIN 20  TIBC 430  IRON 24*  RETICCTPCT 1.1   Urinalysis:  Recent Labs Lab 01/23/14 0533  COLORURINE YELLOW  LABSPEC 1.021  PHURINE 5.5  GLUCOSEU NEGATIVE  HGBUR NEGATIVE  BILIRUBINUR NEGATIVE  KETONESUR 40*  PROTEINUR NEGATIVE  UROBILINOGEN 0.2  NITRITE NEGATIVE  LEUKOCYTESUR NEGATIVE    Micro Results: Recent Results (from the past 240 hour(s))  GC/CHLAMYDIA PROBE AMP     Status: None   Collection Time    01/23/14  6:44 AM      Result Value Range Status   CT Probe RNA NEGATIVE  NEGATIVE Final   GC Probe RNA NEGATIVE  NEGATIVE Final   Comment: (NOTE)                                                                                               **  Normal Reference Range: Negative**          Assay performed using the Gen-Probe APTIMA COMBO2 (R) Assay.     Acceptable specimen types for this assay include APTIMA Swabs (Unisex,     endocervical, urethral, or vaginal), first void urine, and ThinPrep     liquid based cytology samples.     Performed at [facility]  WET PREP, GENITAL     Status: None   Collection Time    01/23/14  6:46 AM      Result Value Range Status   Yeast Wet Prep HPF POC NONE SEEN  NONE SEEN Final   Trich, Wet Prep NONE SEEN  NONE SEEN Final   Clue Cells Wet Prep HPF POC NONE SEEN  NONE SEEN Final   WBC, Wet Prep HPF POC NONE SEEN  NONE SEEN Final  CULTURE, BLOOD (ROUTINE X 2)     Status: None    Collection Time    01/23/14  6:40 PM      Result Value Range Status   Specimen Description BLOOD RIGHT ARM   Final   Special Requests BOTTLES DRAWN AEROBIC AND ANAEROBIC 5CC   Final   Culture  Setup Time     Final   Value: 01/24/2014 02:05     Performed at [facility]   Culture     Final   Value:        BLOOD CULTURE RECEIVED NO GROWTH TO DATE CULTURE WILL BE HELD FOR 5 DAYS BEFORE ISSUING A FINAL NEGATIVE REPORT     Performed at [facility]   Report Status PENDING   Incomplete  CULTURE, BLOOD (ROUTINE X 2)     Status: None   Collection Time    01/23/14  6:50 PM      Result Value Range Status   Specimen Description BLOOD RIGHT HAND   Final   Special Requests BOTTLES DRAWN AEROBIC AND ANAEROBIC 10 CC   Final   Culture  Setup Time     Final   Value: 01/24/2014 02:04     Performed at [facility]   Culture     Final   Value:        BLOOD CULTURE RECEIVED NO GROWTH TO DATE CULTURE WILL BE HELD FOR 5 DAYS BEFORE ISSUING A FINAL NEGATIVE REPORT     Performed at [facility]   Report Status PENDING   Incomplete   Studies/Results: Ct Abdomen Pelvis W Contrast  01/25/2014   CLINICAL DATA:  Persistent right lower quadrant pain question appendicitis  EXAM: CT ABDOMEN AND PELVIS WITH CONTRAST  TECHNIQUE: Multidetector CT imaging of the abdomen and pelvis was performed using the standard protocol following bolus administration of intravenous contrast. Sagittal and coronal MPR images reconstructed from axial data set.  CONTRAST:  OMNIPAQUE IOHEXOL 300 MG/ML SOLN. Dilute oral contrast.  COMPARISON:  01/23/2014  FINDINGS: Lung bases clear.  Air and fluid within a minimally distended distal thoracic esophagus.  Small splenule anterior to spleen.  Liver, spleen, pancreas, kidneys, and adrenal glands normal appearance.  Unremarkable bladder in uterus.  Appendix is not localized.  No pericecal inflammatory process.  Inflammatory process identified in right adnexa and  cul-de-sac with diffuse infiltration of fat planes.  Rounded fluid attenuation collection with enhancing margins is seen to cul de sac suspicious for abscess, 4.8 x 3.4 x 2.6 cm.  Additional fluid collection is seen in the right adnexa, 3.8 x 2.8 x 4.3 cm with  linear central density, suspicious for a dilated fallopian tube looped on itself, less likely abscess.  These inflammatory changes do not extend to the cecum.  Areas of fat are seen in the adnexae which do not show infiltration, similar to previous exam, doubt fat containing masses of the ovaries such as dermoid tumor though not completely excluded since the ovaries are not visualized.  Sigmoid colon incompletely distended, limiting assessment of wall thickness.  Stomach and bowel loops otherwise normal appearance.  No mass, adenopathy, hernia, or acute osseous findings.  IMPRESSION: Appendix is not visualized but no definite pericecal inflammation is seen.  Pelvic inflammatory process in the right adnexa and cul-de-sac most suspicious for pelvic inflammatory disease with a cul de sac abscess as discussed above.  Findings called to Dr. Davina Poke on 02/14/2014 at 1335 hr.   Electronically Signed   By: Ulyses Southward M.D.   On: 01/25/2014 13:35   Medications: I have reviewed the patient's current medications. Scheduled Meds: . amoxicillin-clavulanate  1 tablet Oral BID WC  . heparin subcutaneous  5,000 Units Subcutaneous Q8H  . levothyroxine  150 mcg Oral QAC breakfast  . metronidazole  500 mg Intravenous Q12H  . ondansetron (ZOFRAN) IV  4 mg Intravenous Q6H  . pantoprazole  40 mg Oral QHS   Continuous Infusions: . dextrose 5 % and 0.9% NaCl 50 mL/hr at 01/26/14 1258   PRN Meds:.morphine injection, oxyCODONE-acetaminophen  Assessment/Plan: 43 year old woman with past medical history of achalasia s/p surgery and anemia who presented on 1/30 with RLQ abdominal pain and found to have leukocytosis with suspected PID.  Pelvic Inflammatory Disease: Pt  with cervical motion tenderness and adnexal tenderness with leukocytosis, RLQ pain, and imaging indicating right ovary infection vs inflammation concerning for PID. Seen by general surgery yesterday, sent patient for CT abdomen. CT showed pelvic inflammatory process in the right adnexa and cul-de-sac most suspicious for PID with a cul-de-sac abscess. - Percocet 5-325 q4h prn + IV morphine 2 mg Q 2hr for breakthrough pain  - IV zofran 4 mg Q 6 hr PRN for nausea - Appreciate General Surgery and OBGYN recommendations  - D/w OB/GYN this AM, feel patient is stable for discharge tomorrow if afebrile with close follow up in GYN clinic w/ Dr. Jolayne Panther. Appointment scheduled for 02/04/14 @ 1:30 PM - Also suggesting changing to Augmentin 875-125 po bid + Flagyl 500 mg po bid (for total of 14 days).  Hypothyroidism: TSH 20.225 H, free T4 0.66 Levothyroxine 150 mcg daily started on 1/31. - Continue Synthroid - Recheck TSH in 6 weeks for further adjustment of synthroid  Microcytic/Normocytic Anemia: Currently with no active bleeding or hemodynamic instability. Pt with Hg of 11.7 on admission. Anemia panel with iron (24 L), ferritin (20), TIBC (wnl), Iron sat (6 L), folate( wnl), B12. Possibly 2/2 intraabdominal process.  - Re-check CBC in AM  GERD: Stable. Currently without symptoms of acid reflux. - Protonix 40 mg qd  Diet: NPO for now  DVT PPx: SQ Heparin  Code: Full  Dispo: Disposition is deferred at this time, awaiting improvement of current medical problems.  Anticipated discharge in approximately 1-2 day(s).   The patient does not have a current PCP (No Pcp Per Patient) and does need an [facility] hospital follow-up appointment after discharge.  The patient does not have transportation limitations that hinder transportation to clinic appointments.  .Services Needed at time of discharge: Y = Yes, Blank = No PT:   OT:   RN:   Equipment:  Other:     LOS: 3 days   Courtney Paris, MD 01/26/2014, 3:08  PM

## 2014-01-27 DIAGNOSIS — E039 Hypothyroidism, unspecified: Secondary | ICD-10-CM

## 2014-01-27 LAB — BASIC METABOLIC PANEL
BUN: 3 mg/dL — AB (ref 6–23)
CALCIUM: 9.2 mg/dL (ref 8.4–10.5)
CO2: 26 mEq/L (ref 19–32)
Chloride: 104 mEq/L (ref 96–112)
Creatinine, Ser: 0.63 mg/dL (ref 0.50–1.10)
GFR calc non Af Amer: >90 mL/min (ref >90)
GLUCOSE: 89 mg/dL (ref 70–99)
Potassium: 3.7 mEq/L (ref 3.7–5.3)
SODIUM: 142 meq/L (ref 137–147)

## 2014-01-27 LAB — CBC
HCT: 29 % — ABNORMAL LOW (ref 36.0–46.0)
Hemoglobin: 9.3 g/dL — ABNORMAL LOW (ref 12.0–15.0)
MCH: 25.9 pg — AB (ref 26.0–34.0)
MCHC: 32.1 g/dL (ref 30.0–36.0)
MCV: 80.8 fL (ref 78.0–100.0)
PLATELETS: 307 10*3/uL (ref 150–400)
RBC: 3.59 MIL/uL — ABNORMAL LOW (ref 3.87–5.11)
RDW: 17.3 % — AB (ref 11.5–15.5)
WBC: 5.5 10*3/uL (ref 4.0–10.5)

## 2014-01-27 LAB — GC/CHLAMYDIA PROBE AMP
CT PROBE, AMP APTIMA: NEGATIVE
GC PROBE AMP APTIMA: NEGATIVE

## 2014-01-27 MED ORDER — OXYCODONE HCL 5 MG PO TABS: 5.0000 mg | ORAL_TABLET | ORAL | Status: DC | PRN

## 2014-01-27 MED ORDER — AMOXICILLIN-POT CLAVULANATE 875-125 MG PO TABS: 1.0000 | ORAL_TABLET | Freq: Two times a day (BID) | ORAL | Status: DC

## 2014-01-27 MED ORDER — METRONIDAZOLE 500 MG PO TABS: 500.0000 mg | ORAL_TABLET | Freq: Two times a day (BID) | ORAL | Status: DC

## 2014-01-27 MED ORDER — PROMETHAZINE HCL 12.5 MG PO TABS: 12.5000 mg | ORAL_TABLET | Freq: Four times a day (QID) | ORAL | Status: DC | PRN

## 2014-01-27 MED ORDER — LEVOTHYROXINE SODIUM 150 MCG PO TABS: 150.0000 ug | ORAL_TABLET | Freq: Every day | ORAL | Status: DC

## 2014-01-27 MED ORDER — OXYCODONE-ACETAMINOPHEN 5-325 MG PO TABS: 1.0000 | ORAL_TABLET | Freq: Four times a day (QID) | ORAL | Status: DC | PRN

## 2014-01-27 MED ORDER — MORPHINE SULFATE 2 MG/ML IJ SOLN: 2.0000 mg | INTRAMUSCULAR | Status: DC | PRN

## 2014-01-27 MED ORDER — OXYCODONE-ACETAMINOPHEN 5-325 MG PO TABS
1.0000 | ORAL_TABLET | ORAL | Status: DC | PRN
  Administered 2014-01-27: 2 via ORAL
  Filled 2014-01-27: qty 2

## 2014-01-27 NOTE — Discharge Instructions (Signed)
1. You have the following appointments scheduled:  Annette Anderson, Annette Anderson  On 02/04/2014 1:30 PM  [address] [place] [address] [phone]  Dede QueryLi, Annette Anderson  On 02/10/2014 9:00 AM  [address] [place] [address] [phone]  2. Please take all medications as prescribed. Please continue to take Augmentin and Flagyl as prescribed.  3. If you have worsening of your symptoms or new symptoms arise, please call the clinic [phone]), or go to the ER immediately if symptoms are severe.    Pelvic Inflammatory Disease Pelvic inflammatory disease (PID) refers to an infection in some or all of the female organs. The infection can be in the uterus, ovaries, fallopian tubes, or the surrounding tissues in the pelvis. PID can cause abdominal or pelvic pain that comes on suddenly (acute pelvic pain). PID is a serious infection because it can lead to lasting (chronic) pelvic pain or the inability to have children (infertile).  CAUSES  The infection is often caused by the normal bacteria found in the vaginal tissues. PID may also be caused by an infection that is spread during sexual contact. PID can also occur following:   The birth of a baby.   A miscarriage.   An abortion.   Major pelvic surgery.   The use of an intrauterine device (IUD).   A sexual assault.  RISK FACTORS Certain factors can put a person at higher risk for PID, such as:  Being younger than 25 years.  Being sexually active at [place] age.  Usingnonbarrier contraception.  Havingmultiple sexual partners.  Having sex with someone who has symptoms of a genital infection.  Using oral contraception. Other times, certain behaviors can increase the possibility of getting PID, such as:  Having sex during your period.  Using a vaginal douche.  Having an intrauterine device (IUD) in place. SYMPTOMS   Abdominal or pelvic pain.   Fever.   Chills.   Abnormal vaginal discharge.  Abnormal uterine  bleeding.   Unusual pain shortly after finishing your period. DIAGNOSIS  Your caregiver will choose some of the following methods to make a diagnosis, such as:   Performinga physical exam and history. A pelvic exam typically reveals a very tender uterus and surrounding pelvis.   Ordering laboratory tests including a pregnancy test, blood tests, and urine test.  Orderingcultures of the vagina and cervix to check for a sexually transmitted infection (STI).  Performing an ultrasound.   Performing a laparoscopic procedure to look inside the pelvis.  TREATMENT   Antibiotic medicines may be prescribed and taken by mouth.   Sexual partners may be treated when the infection is caused by a sexually transmitted disease (STD).   Hospitalization may be needed to give antibiotics intravenously.  Surgery may be needed, but this is rare. It may take weeks until you are completely well. If you are diagnosed with PID, you should also be checked for human immunodeficiency virus (HIV). HOME CARE INSTRUCTIONS   If given, take your antibiotics as directed. Finish the medicine even if you start to feel better.   Only take over-the-counter or prescription medicines for pain, discomfort, or fever as directed by your caregiver.   Do not have sexual intercourse until treatment is completed or as directed by your caregiver. If PID is confirmed, your recent sexual partner(s) will need treatment.   Keep your follow-up appointments. SEEK MEDICAL CARE IF:   You have increased or abnormal vaginal discharge.   You need prescription medicine for your pain.   You  vomit.   You cannot take your medicines.   Your partner has an STD.  SEEK IMMEDIATE MEDICAL CARE IF:   You have a fever.   You have increased abdominal or pelvic pain.   You have chills.   You have pain when you urinate.   You are not better after 72 hours following treatment.  MAKE SURE YOU:   Understand these  instructions.  Will watch your condition.  Will get help right away if you are not doing well or get worse. Document Released: 12/11/2005 Document Revised: 04/07/2013 Document Reviewed: 12/07/2011 [facility] Patient Information 2014 [place], [place].

## 2014-01-27 NOTE — Care Management Note (Signed)
    Page 1 of 1   01/27/2014     2:37:27 PM   CARE MANAGEMENT NOTE 01/27/2014  Patient:  Annette Anderson,Annette Anderson   Account Number:  [id number]  Date Initiated:  01/27/2014  Documentation initiated by:  Ronny FlurryWILE,Arkin Imran  Subjective/Objective Assessment:     Action/Plan:   Anticipated DC Date:  01/27/2014   Anticipated DC Plan:  HOME/SELF CARE      DC Planning Services  [facility]      Choice offered to / List presented to:             Status of service:   Medicare Important Message given?   (If response is "NO", the following Medicare IM given date fields will be blank) Date Medicare IM given:   Date Additional Medicare IM given:    Discharge Disposition:    Per UR Regulation:    If discussed at Long Length of Stay Meetings, dates discussed:    Comments:

## 2014-01-27 NOTE — Discharge Summary (Signed)
Name: Annette Anderson MRN: [id number] DOB: [birth year] 43 y.o. PCP: No Pcp Per Patient  Date of Admission: 01/23/2014  4:08 AM Date of Discharge: 01/27/2014 Attending Physician: Rocco Serene, MD  Discharge Diagnosis: 1. Pelvic Inflammatory Disease  2. Hypothyroidism 3. Anemia  Discharge Medications:   Medication List    STOP taking these medications       HYDROcodone-acetaminophen 5-325 MG per tablet  Commonly known as:  NORCO/VICODIN      TAKE these medications       amoxicillin-clavulanate 875-125 MG per tablet  Commonly known as:  AUGMENTIN  Take 1 tablet by mouth 2 (two) times daily with a meal.     ibuprofen 200 MG tablet  Commonly known as:  ADVIL,MOTRIN  Take 400-800 mg by mouth every 6 (six) hours as needed for moderate pain.     levothyroxine 150 MCG tablet  Commonly known as:  SYNTHROID, LEVOTHROID  Take 1 tablet (150 mcg total) by mouth daily before breakfast.     metroNIDAZOLE 500 MG tablet  Commonly known as:  FLAGYL  Take 1 tablet (500 mg total) by mouth every 12 (twelve) hours.     oxyCODONE-acetaminophen 5-325 MG per tablet  Commonly known as:  PERCOCET/ROXICET  Take 1-2 tablets by mouth every 6 (six) hours as needed for severe pain.     promethazine 12.5 MG tablet  Commonly known as:  PHENERGAN  Take 1 tablet (12.5 mg total) by mouth every 6 (six) hours as needed for nausea or vomiting.     VITAMIN B 12 PO  Take 1 tablet by mouth daily.        Disposition and follow-up:   Ms.Annette Anderson was discharged from [facility] in Good condition.  At the hospital follow up visit please address:  1.  Abdominal pain, nausea, vomiting, fever, chills. Improvement after course of antibiotics?  2.  Labs / imaging needed at time of follow-up: CBC; repeat CT Abd/pelv if symptoms worsened?  3.  Pending labs/ test needing follow-up: none  Follow-up Appointments:     Follow-up Information   Follow up with CONSTANT,PEGGY, MD On  02/04/2014. (1:30 PM)    Specialty:  Obstetrics and Gynecology   Contact information:   [address] [place] [address] [phone]       Follow up with Dierdre Searles, NA, MD On 02/10/2014. (9:00 AM)    Specialty:  Internal Medicine   Contact information:   [address] [place] [address] [phone]       Discharge Instructions: Discharge Orders   Future Appointments Provider Department Dept Phone   02/04/2014 1:30 PM Catalina Antigua, MD [facility] [phone]   02/10/2014 9:00 AM Na Dierdre Searles, MD [facility] [phone]   Future Orders Complete By Expires   Call MD for:  persistant nausea and vomiting  As directed    Call MD for:  temperature >100.4  As directed    Increase activity slowly  As directed       Consultations: Treatment Team:  Lesly Dukes, MD  Procedures Performed:  Ct Abdomen Pelvis Wo Contrast  01/23/2014   CLINICAL DATA:  Right flank pain  EXAM: CT ABDOMEN AND PELVIS WITHOUT CONTRAST  TECHNIQUE: Multidetector CT imaging of the abdomen and pelvis was performed following the standard protocol without intravenous contrast.  COMPARISON:  None.  FINDINGS: Linear right middle lobe opacity is favored to reflect atelectasis or scarring. Heart size within normal limits. Distal esophageal wall  thickening and small hiatal hernia.  Organ evaluation is limited without intravenous contrast. Within this limitation, no appreciable abnormality of the liver, biliary system, pancreas, spleen, adrenal glands.  Symmetric renal size. No hydroureteronephrosis no definite urinary tract calculi. Unable to follow the ureteral course in their entirety given the decompressed state.  Colonic diverticulosis. No CT evidence for colitis or diverticulitis. Appendix not identified. Small bowel loops are of normal course and caliber. No free intraperitoneal air. No lymphadenopathy.  Normal caliber aorta and branch vessels.  Partially decompressed bladder.  Nonspecific appearance to the uterus and adnexa. There is mild stranding in the right adnexa and small amount of associated free fluid.  No acute osseous finding.  IMPRESSION: No hydronephrosis or urinary tract calculi identified.  Appendix not identified.  Mild right adnexal stranding/fluid may be physiologic. Correlate with pelvic ultrasound if concerned for acute pathology.  Distal esophageal wall thickening may reflect esophagitis or occurred.   Electronically Signed   By: Jearld Lesch M.D.   On: 01/23/2014 06:24   US Transvaginal Non-ob  01/23/2014   CLINICAL DATA:  Severe right adnexal pain and tenderness.  EXAM: TRANSABDOMINAL AND TRANSVAGINAL ULTRASOUND OF PELVIS  DOPPLER ULTRASOUND OF OVARIES  TECHNIQUE: Both transabdominal and transvaginal ultrasound examinations of the pelvis were performed. Transabdominal technique was performed for global imaging of the pelvis including uterus, ovaries, adnexal regions, and pelvic cul-de-sac.  It was necessary to proceed with endovaginal exam following the transabdominal exam to visualize the ovaries and adnexa. Color and duplex Doppler ultrasound was utilized to evaluate blood flow to the ovaries.  COMPARISON:  CT scan dated 01/23/2014  FINDINGS: Uterus  Measurements: 8.2 x 3.7 x 5.5 cm. No fibroids or other mass visualized.  Endometrium  Thickness: 10.7 mm. 3 mm cystic area in the endometrium of unknown etiology. The patient has a negative pregnancy test.  Right ovary  Measurements: 3.4 x 2.2 x 2.7 cm. The adnexal tissues around the right ovary are slightly prominent. There is normal arterial and venous perfusion to the right ovary. The patient was quite tender in the right adnexal region.  Left ovary  Measurements: 2.1 x 1.3 x 1.7 cm. Normal appearance/no adnexal mass. There is normal arterial and venous perfusion to the left ovary.  Pulsed Doppler evaluation of both ovaries demonstrates normal low-resistance arterial and venous waveforms.  Other findings   Small amount of free fluid in the pelvis for.  IMPRESSION: Nonspecific soft tissue prominence around the right ovary suggesting inflammation or infection. Normal perfusion to the ovaries. Small amount of free fluid in the pelvis.   Electronically Signed   By: Geanie Cooley M.D.   On: 01/23/2014 10:42   US Pelvis Complete  01/23/2014   CLINICAL DATA:  Severe right adnexal pain and tenderness.  EXAM: TRANSABDOMINAL AND TRANSVAGINAL ULTRASOUND OF PELVIS  DOPPLER ULTRASOUND OF OVARIES  TECHNIQUE: Both transabdominal and transvaginal ultrasound examinations of the pelvis were performed. Transabdominal technique was performed for global imaging of the pelvis including uterus, ovaries, adnexal regions, and pelvic cul-de-sac.  It was necessary to proceed with endovaginal exam following the transabdominal exam to visualize the ovaries and adnexa. Color and duplex Doppler ultrasound was utilized to evaluate blood flow to the ovaries.  COMPARISON:  CT scan dated 01/23/2014  FINDINGS: Uterus  Measurements: 8.2 x 3.7 x 5.5 cm. No fibroids or other mass visualized.  Endometrium  Thickness: 10.7 mm. 3 mm cystic area in the endometrium of unknown etiology. The patient has a negative pregnancy test.  Right  ovary  Measurements: 3.4 x 2.2 x 2.7 cm. The adnexal tissues around the right ovary are slightly prominent. There is normal arterial and venous perfusion to the right ovary. The patient was quite tender in the right adnexal region.  Left ovary  Measurements: 2.1 x 1.3 x 1.7 cm. Normal appearance/no adnexal mass. There is normal arterial and venous perfusion to the left ovary.  Pulsed Doppler evaluation of both ovaries demonstrates normal low-resistance arterial and venous waveforms.  Other findings  Small amount of free fluid in the pelvis for.  IMPRESSION: Nonspecific soft tissue prominence around the right ovary suggesting inflammation or infection. Normal perfusion to the ovaries. Small amount of free fluid in the pelvis.    Electronically Signed   By: Geanie Cooley M.D.   On: 01/23/2014 10:42   Ct Abdomen Pelvis W Contrast  01/25/2014   CLINICAL DATA:  Persistent right lower quadrant pain question appendicitis  EXAM: CT ABDOMEN AND PELVIS WITH CONTRAST  TECHNIQUE: Multidetector CT imaging of the abdomen and pelvis was performed using the standard protocol following bolus administration of intravenous contrast. Sagittal and coronal MPR images reconstructed from axial data set.  CONTRAST:  OMNIPAQUE IOHEXOL 300 MG/ML SOLN. Dilute oral contrast.  COMPARISON:  01/23/2014  FINDINGS: Lung bases clear.  Air and fluid within a minimally distended distal thoracic esophagus.  Small splenule anterior to spleen.  Liver, spleen, pancreas, kidneys, and adrenal glands normal appearance.  Unremarkable bladder in uterus.  Appendix is not localized.  No pericecal inflammatory process.  Inflammatory process identified in right adnexa and cul-de-sac with diffuse infiltration of fat planes.  Rounded fluid attenuation collection with enhancing margins is seen to cul de sac suspicious for abscess, 4.8 x 3.4 x 2.6 cm.  Additional fluid collection is seen in the right adnexa, 3.8 x 2.8 x 4.3 cm with linear central density, suspicious for a dilated fallopian tube looped on itself, less likely abscess.  These inflammatory changes do not extend to the cecum.  Areas of fat are seen in the adnexae which do not show infiltration, similar to previous exam, doubt fat containing masses of the ovaries such as dermoid tumor though not completely excluded since the ovaries are not visualized.  Sigmoid colon incompletely distended, limiting assessment of wall thickness.  Stomach and bowel loops otherwise normal appearance.  No mass, adenopathy, hernia, or acute osseous findings.  IMPRESSION: Appendix is not visualized but no definite pericecal inflammation is seen.  Pelvic inflammatory process in the right adnexa and cul-de-sac most suspicious for pelvic  inflammatory disease with a cul de sac abscess as discussed above.  Findings called to Dr. Davina Poke on 02/14/2014 at 1335 hr.   Electronically Signed   By: Ulyses Southward M.D.   On: 01/25/2014 13:35   [place] Art/ven Flow Abd Pelv Doppler  01/23/2014   CLINICAL DATA:  Severe right adnexal pain and tenderness.  EXAM: TRANSABDOMINAL AND TRANSVAGINAL ULTRASOUND OF PELVIS  DOPPLER ULTRASOUND OF OVARIES  TECHNIQUE: Both transabdominal and transvaginal ultrasound examinations of the pelvis were performed. Transabdominal technique was performed for global imaging of the pelvis including uterus, ovaries, adnexal regions, and pelvic cul-de-sac.  It was necessary to proceed with endovaginal exam following the transabdominal exam to visualize the ovaries and adnexa. Color and duplex Doppler ultrasound was utilized to evaluate blood flow to the ovaries.  COMPARISON:  CT scan dated 01/23/2014  FINDINGS: Uterus  Measurements: 8.2 x 3.7 x 5.5 cm. No fibroids or other mass visualized.  Endometrium  Thickness: 10.7  mm. 3 mm cystic area in the endometrium of unknown etiology. The patient has a negative pregnancy test.  Right ovary  Measurements: 3.4 x 2.2 x 2.7 cm. The adnexal tissues around the right ovary are slightly prominent. There is normal arterial and venous perfusion to the right ovary. The patient was quite tender in the right adnexal region.  Left ovary  Measurements: 2.1 x 1.3 x 1.7 cm. Normal appearance/no adnexal mass. There is normal arterial and venous perfusion to the left ovary.  Pulsed Doppler evaluation of both ovaries demonstrates normal low-resistance arterial and venous waveforms.  Other findings  Small amount of free fluid in the pelvis for.  IMPRESSION: Nonspecific soft tissue prominence around the right ovary suggesting inflammation or infection. Normal perfusion to the ovaries. Small amount of free fluid in the pelvis.   Electronically Signed   By: Geanie CooleyJim  Maxwell M.D.   On: 01/23/2014 10:42   Dg Abd 2  Views  01/24/2014   CLINICAL DATA:  Right flank pain which began 2 days ago. Nausea and vomiting. Diarrhea.  EXAM: ABDOMEN - 2 VIEW  COMPARISON:  US PELVIS COMPLETE with US TRANSVAGINAL NON-OB dated 01/23/2014; CT ABD/PELV WO CM dated 01/23/2014  FINDINGS: Bowel gas pattern unremarkable without evidence of obstruction or significant ileus. No evidence of free air or significant air-fluid levels on the erect image. No visible opaque urinary tract calculi. Phleboliths low in both sides of the pelvis. Regional skeleton unremarkable.  IMPRESSION: No acute abdominal abnormality.   Electronically Signed   By: Hulan Saashomas  Lawrence M.D.   On: 01/24/2014 13:57   Admission HPI:  Jonita AlbeeBridget Anderson is an 43 y.o. female with past medical history of achalasia (S/P of surgery) and anemia, who presents with abdominal pain, nausea, vomiting and diarrhea.  Patient reports that she started having sudden onset abdominal pain when she was driving a car 2 days ago. The abdominal pain is mainly located at right lower quadrant, sharp, constant, radiating to the back, 10 out of 10 in severity. It is associated with low grade fever, nausea and vomiting. She vomited twice without blood in the vomitus. She also had diarrhea on the day when her abdominal pain started. She had at least 10 bowel movements with watery stool on the first day. She took Imodium twice, her diarrhea has almost resolved on the second day. Currently she does not have diarrhea, but still has nausea and abdominal pain. Patient took ibuprofen q4h without help. She also reports having dysuria and increased urinary frequency, but no burning sensation on urination. Patient is sexually active with her fianc, reports using condom. She has chronic menstrual period induced abdominal pain, which she thinks is completely different with this pain. Patient has regular, but heavy menstrual period . No vaginal discharge or dyspareunia.  Patient was found to have negative UA in ED. CT-abd  without contrast showed no hydronephrosis, kidney stone, diverticulitis or colitis, appendix did not visualize well, with some right adnexal stranding and some small amount of free fluid, no free air or lymphadenopathy. Trans abdominal and transvaginal US showed nonspecific soft tissue prominence around the right ovary suggesting inflammation or infection. GYN and surgery were consulted by ED. GYN suspects PID w/ CMT. Surgeon thinks that it is likely diagnosis of ruptured hemorrhagic ovarian cyst.  Hospital Course by problem list:   1. Pelvic Inflammatory Disease- Patient presented w/ abdominal pain, nausea, vomiting, and diarrhea. Found to have negative UA in ED but w/ leukocytosis. CT abd w/o contrast showed no hydronephrosis,  kidney stone, diverticulitis or colitis, appendix not visualize well, with some right adnexal stranding and some small amount of free fluid. No free air or lymphadenopathy. Transabdominal and transvaginal US showed nonspecific soft tissue prominence around the right ovary suggesting inflammation or infection. Patient made NPO, GYN and surgery were consulted by ED. Started on Ceftriaxone and Doxycycline in ED, switched to Cefoxitin and Doxy for better coverage if PID. GC/Chlamydia probe and wet prep found to be negative/wnl. Treated pain w/ IV morphine. On 01/25/14 surgery team ordered CT abd/pelvis w/ contrast, which showed a pelvic inflammatory process in the right adnexa and cul-de-sac most suspicious for pelvic inflammatory disease with a cul de sac abscess. Appendicitis ruled out. ABx changed to total of 14 day course of Doxy and Flagyl as per OB/GYN on 01/25/14. On 01/26/14, patient felt clinically improved, changed pain medications to Percocet w/ improved pain relief. Discussed w/ OB/GYN, patient stable to follow up w/ Dr. Jolayne Panther as an outpatient, changed ABx to Augmentin + Flagyl for total of 14 days (end date 02/05/14). Discharged 2/3 w/ ABx listed + Percocet 5-325 #30 + Zofran. Will  also follow in our clinic.   2. Hypothyroidism- Patient not previously diagnosed w/ thyroid dysfunction. TSH and fT4 on admission were 20.23 +0.66, respectively. Started on levothyroxine 150 mcg daily on 01/24/14. Will need recheck TSH in 6 weeks for further adjustment.  3. Anemia-  Hb of 11.7 on admission, no active bleeding or hemodynamic instability. Patient with slow decrease in Hb during hospitalization, likely d/t IVF. Anemia panel showed Iron (24 L), ferritin (20), TIBC (wnl), Iron sat (6 L), folate (wnl), B12. Will need further workup as an outpatient.   Discharge Vitals:   BP 136/89  Pulse 70  Temp(Src) 98.1 F (36.7 C) (Oral)  Resp 20  Ht 5\' 1"  (1.549 m)  Wt 174 lb (78.926 kg)  BMI 32.89 kg/m2  SpO2 100%  LMP 12/18/2013  Discharge Labs:  Results for orders placed during the hospital encounter of 01/23/14 (from the past 24 hour(s))  BASIC METABOLIC PANEL     Status: Abnormal   Collection Time    01/27/14  7:25 AM      Result Value Range   Sodium 142  137 - 147 mEq/L   Potassium 3.7  3.7 - 5.3 mEq/L   Chloride 104  96 - 112 mEq/L   CO2 26  19 - 32 mEq/L   Glucose, Bld 89  70 - 99 mg/dL   BUN 3 (*) 6 - 23 mg/dL   Creatinine, Ser 1.61  0.50 - 1.10 mg/dL   Calcium 9.2  8.4 - 09.6 mg/dL   GFR calc non Af Amer >90  >90 mL/min   GFR calc Af Amer >90  >90 mL/min  CBC     Status: Abnormal   Collection Time    01/27/14  7:25 AM      Result Value Range   WBC 5.5  4.0 - 10.5 K/uL   RBC 3.59 (*) 3.87 - 5.11 MIL/uL   Hemoglobin 9.3 (*) 12.0 - 15.0 g/dL   HCT 04.5 (*) 40.9 - 81.1 %   MCV 80.8  78.0 - 100.0 fL   MCH 25.9 (*) 26.0 - 34.0 pg   MCHC 32.1  30.0 - 36.0 g/dL   RDW 91.4 (*) 78.2 - 95.6 %   Platelets 307  150 - 400 K/uL    Signed: Courtney Paris, MD 01/27/2014, 1:00 PM   Time Spent on Discharge: 35 minutes Services  Ordered on Discharge: none Equipment Ordered on Discharge: none

## 2014-01-27 NOTE — Progress Notes (Signed)
Subjective:  Patient examined at bedside this AM. Much improved from yesterday, pain well controlled, denies nausea. Says she is ready to go home.  Stable for discharge.  Objective: Vital signs in last 24 hours: Filed Vitals:   01/26/14 0504 01/26/14 1429 01/26/14 2215 01/27/14 0621  BP: 134/77 142/93 144/80 136/89  Pulse: 79 82 75 70  Temp: 99 F (37.2 C) 99 F (37.2 C) 98.1 F (36.7 C) 98.1 F (36.7 C)  TempSrc: Oral Oral Oral Oral  Resp: 18 18 20 20   Height:      Weight:      SpO2: 100% 100% 100% 100%   Weight change:   Intake/Output Summary (Last 24 hours) at 01/27/14 0719 Last data filed at 01/27/14 0620  Gross per 24 hour  Intake   1320 ml  Output    950 ml  Net    370 ml   General: Awake, alert, NAD. Cooperative with exam. HEENT: PERRL, EOMI, exophthalmos b/l R>L  Cardiac: RRR, No murmurs, gallops or rubs  Pulm: Good air movement bilaterally. No rales, wheezing, rhonchi or rubs.  Abd: Soft, non-tender, non-distended, BS +.  Ext: No swelling/edema.  Distal pulses intact.  Musculoskeletal: No joint deformities, erythema, or stiffness, ROM full  Skin: No rashes, erythema, or ulcerations Neuro: Alert and oriented X3. No focal sensory or focal abnormalities.  Psych: Normal mood and affect  Lab Results: Basic Metabolic Panel:  Recent Labs Lab 01/23/14 0419 01/24/14 0525  NA 139 142  K 3.6* 3.8  CL 102 108  CO2 16* 24  GLUCOSE 137* 99  BUN 11 4*  CREATININE 0.71 0.71  CALCIUM 9.3 8.6   Liver Function Tests:  Recent Labs Lab 01/23/14 0419  AST 22  ALT 13  ALKPHOS 78  BILITOT 0.4  PROT 7.8  ALBUMIN 4.0    Recent Labs Lab 01/23/14 1600  LIPASE 29   CBC:  Recent Labs Lab 01/23/14 1059 01/24/14 0525 01/25/14 0340  WBC 11.7* 8.2 6.8  NEUTROABS 9.7* 5.3  --   HGB 10.0* 9.5* 9.0*  HCT 31.6* 30.4* 29.1*  MCV 78.8 80.9 80.6  PLT 265 257 258   Hemoglobin A1C:  Recent Labs Lab 01/25/14 0340  HGBA1C 5.4   Thyroid Function  Tests:  Recent Labs Lab 01/23/14 1600  TSH 20.225*  FREET4 0.66*   Coagulation:  Recent Labs Lab 01/23/14 1600  LABPROT 13.7  INR 1.07   Anemia Panel:  Recent Labs Lab 01/23/14 1600  VITAMINB12 659  FOLATE 15.2  FERRITIN 20  TIBC 430  IRON 24*  RETICCTPCT 1.1   Urinalysis:  Recent Labs Lab 01/23/14 0533  COLORURINE YELLOW  LABSPEC 1.021  PHURINE 5.5  GLUCOSEU NEGATIVE  HGBUR NEGATIVE  BILIRUBINUR NEGATIVE  KETONESUR 40*  PROTEINUR NEGATIVE  UROBILINOGEN 0.2  NITRITE NEGATIVE  LEUKOCYTESUR NEGATIVE    Micro Results: Recent Results (from the past 240 hour(s))  GC/CHLAMYDIA PROBE AMP     Status: None   Collection Time    01/23/14  6:44 AM      Result Value Range Status   CT Probe RNA NEGATIVE  NEGATIVE Final   GC Probe RNA NEGATIVE  NEGATIVE Final   Comment: (NOTE)                                                                                               **  Normal Reference Range: Negative**          Assay performed using the Gen-Probe APTIMA COMBO2 (R) Assay.     Acceptable specimen types for this assay include APTIMA Swabs (Unisex,     endocervical, urethral, or vaginal), first void urine, and ThinPrep     liquid based cytology samples.     Performed at [facility]  WET PREP, GENITAL     Status: None   Collection Time    01/23/14  6:46 AM      Result Value Range Status   Yeast Wet Prep HPF POC NONE SEEN  NONE SEEN Final   Trich, Wet Prep NONE SEEN  NONE SEEN Final   Clue Cells Wet Prep HPF POC NONE SEEN  NONE SEEN Final   WBC, Wet Prep HPF POC NONE SEEN  NONE SEEN Final  CULTURE, BLOOD (ROUTINE X 2)     Status: None   Collection Time    01/23/14  6:40 PM      Result Value Range Status   Specimen Description BLOOD RIGHT ARM   Final   Special Requests BOTTLES DRAWN AEROBIC AND ANAEROBIC 5CC   Final   Culture  Setup Time     Final   Value: 01/24/2014 02:05     Performed at [facility]   Culture     Final   Value:         BLOOD CULTURE RECEIVED NO GROWTH TO DATE CULTURE WILL BE HELD FOR 5 DAYS BEFORE ISSUING A FINAL NEGATIVE REPORT     Performed at [facility]   Report Status PENDING   Incomplete  CULTURE, BLOOD (ROUTINE X 2)     Status: None   Collection Time    01/23/14  6:50 PM      Result Value Range Status   Specimen Description BLOOD RIGHT HAND   Final   Special Requests BOTTLES DRAWN AEROBIC AND ANAEROBIC 10 CC   Final   Culture  Setup Time     Final   Value: 01/24/2014 02:04     Performed at [facility]   Culture     Final   Value:        BLOOD CULTURE RECEIVED NO GROWTH TO DATE CULTURE WILL BE HELD FOR 5 DAYS BEFORE ISSUING A FINAL NEGATIVE REPORT     Performed at [facility]   Report Status PENDING   Incomplete   Studies/Results: Ct Abdomen Pelvis W Contrast  01/25/2014   CLINICAL DATA:  Persistent right lower quadrant pain question appendicitis  EXAM: CT ABDOMEN AND PELVIS WITH CONTRAST  TECHNIQUE: Multidetector CT imaging of the abdomen and pelvis was performed using the standard protocol following bolus administration of intravenous contrast. Sagittal and coronal MPR images reconstructed from axial data set.  CONTRAST:  OMNIPAQUE IOHEXOL 300 MG/ML SOLN. Dilute oral contrast.  COMPARISON:  01/23/2014  FINDINGS: Lung bases clear.  Air and fluid within a minimally distended distal thoracic esophagus.  Small splenule anterior to spleen.  Liver, spleen, pancreas, kidneys, and adrenal glands normal appearance.  Unremarkable bladder in uterus.  Appendix is not localized.  No pericecal inflammatory process.  Inflammatory process identified in right adnexa and cul-de-sac with diffuse infiltration of fat planes.  Rounded fluid attenuation collection with enhancing margins is seen to cul de sac suspicious for abscess, 4.8 x 3.4 x 2.6 cm.  Additional fluid collection is seen in the right adnexa, 3.8 x 2.8 x 4.3 cm with linear  central density, suspicious for a dilated fallopian tube  looped on itself, less likely abscess.  These inflammatory changes do not extend to the cecum.  Areas of fat are seen in the adnexae which do not show infiltration, similar to previous exam, doubt fat containing masses of the ovaries such as dermoid tumor though not completely excluded since the ovaries are not visualized.  Sigmoid colon incompletely distended, limiting assessment of wall thickness.  Stomach and bowel loops otherwise normal appearance.  No mass, adenopathy, hernia, or acute osseous findings.  IMPRESSION: Appendix is not visualized but no definite pericecal inflammation is seen.  Pelvic inflammatory process in the right adnexa and cul-de-sac most suspicious for pelvic inflammatory disease with a cul de sac abscess as discussed above.  Findings called to Dr. Davina Poke on 02/14/2014 at 1335 hr.   Electronically Signed   By: Ulyses Southward M.D.   On: 01/25/2014 13:35   Medications: I have reviewed the patient's current medications. Scheduled Meds: . amoxicillin-clavulanate  1 tablet Oral BID WC  . heparin subcutaneous  5,000 Units Subcutaneous Q8H  . levothyroxine  150 mcg Oral QAC breakfast  . metroNIDAZOLE  500 mg Oral Q12H  . ondansetron (ZOFRAN) IV  4 mg Intravenous Q6H  . pantoprazole  40 mg Oral QHS   Continuous Infusions: . dextrose 5 % and 0.9% NaCl 50 mL/hr at 01/26/14 1258   PRN Meds:.ketorolac, morphine injection, oxyCODONE-acetaminophen  Assessment/Plan: 43 year old woman with past medical history of achalasia s/p surgery and anemia who presented on 1/30 with RLQ abdominal pain and found to have leukocytosis with suspected PID.  Pelvic Inflammatory Disease: Pt with cervical motion tenderness and adnexal tenderness with leukocytosis, and RLQ pain on admission. CT abdomen showed pelvic inflammatory process in the right adnexa and cul-de-sac most suspicious for PID with a cul-de-sac abscess. - Continue Percocet 5-325 q6h prn - IV zofran 4 mg Q 8 hr PRN for nausea - Appreciate  General Surgery and OBGYN recommendations  - Continue Augmentin 875-125 po bid + Flagyl 500 mg po bid (for 9 more days). - Patient to follow up w/ Dr. Deretha Emory office on 2/11. Will also follow up in [facility] for hospital follow up  Hypothyroidism: TSH 20.225 H, free T4 0.66. Levothyroxine 150 mcg daily started on 1/31. - Continue Synthroid - Recheck TSH in 6 weeks for further adjustment of synthroid  Microcytic/Normocytic Anemia: Currently with no active bleeding or hemodynamic instability. Pt with Hg of 11.7 on admission. Anemia panel with iron (24 L), ferritin (20), TIBC (wnl), Iron sat (6 L), folate( wnl), B12. Possibly 2/2 intraabdominal process. Hb 9.3 this AM, no symptoms of anemia. -Monitor in clinic on outpatient basis.  GERD: Stable. Currently without symptoms of acid reflux. - Protonix 40 mg qd  DVT PPx: SQ Heparin  Code: Full  Dispo: Disposition is deferred at this time, awaiting improvement of current medical problems.  Anticipated discharge in approximately 1-2 day(s).   The patient does not have a current PCP (No Pcp Per Patient) and does need an [facility] hospital follow-up appointment after discharge.  The patient does not have transportation limitations that hinder transportation to clinic appointments.  .Services Needed at time of discharge: Y = Yes, Blank = No PT:   OT:   RN:   Equipment:   Other:     LOS: 4 days   Courtney Paris, MD 01/27/2014, 7:19 AM

## 2014-01-27 NOTE — Progress Notes (Signed)
Pt lost IV access. Refused IV restart. On call MD said okay to leave IV out.

## 2014-01-27 NOTE — Progress Notes (Signed)
Internal Medicine Attending  Date: 01/27/2014  Patient name: Annette AlbeeBridget Fuqua Medical record number: [id number] Date of birth: [birth year] Age: 43 y.o. Gender: female  I saw and evaluated the patient. Her pain was much improved and her abdominal examination was soft, and non-tender.  She is being discharged today with follow-up in the [facility].  Please see the discharge summary for the specifics.

## 2014-01-30 LAB — CULTURE, BLOOD (ROUTINE X 2)
CULTURE: NO GROWTH
Culture: NO GROWTH

## 2014-02-04 ENCOUNTER — Encounter: Payer: Self-pay | Admitting: Medical

## 2014-02-04 ENCOUNTER — Encounter: Payer: Self-pay | Admitting: Obstetrics and Gynecology

## 2014-02-04 ENCOUNTER — Ambulatory Visit (INDEPENDENT_AMBULATORY_CARE_PROVIDER_SITE_OTHER): Payer: Self-pay | Admitting: Medical

## 2014-02-04 VITALS — BP 142/95 | HR 84 | Temp 99.1°F | Ht 62.0 in | Wt 168.0 lb

## 2014-02-04 DIAGNOSIS — R109 Unspecified abdominal pain: Secondary | ICD-10-CM

## 2014-02-04 MED ORDER — OXYCODONE-ACETAMINOPHEN 5-325 MG PO TABS: 1.0000 | ORAL_TABLET | Freq: Four times a day (QID) | ORAL | Status: DC | PRN

## 2014-02-04 NOTE — Patient Instructions (Signed)
Pelvic Inflammatory Disease °Pelvic inflammatory disease (PID) is an infection in some or all of the female organs. PID can be in the uterus, ovaries, fallopian tubes, or the surrounding tissues inside the lower belly area (pelvis). °HOME CARE  °· If given, take your antibiotic medicine as told. Finish them even if you start to feel better. °· Only take medicine as told by your doctor. °· Do not have sex (intercourse) until treatment is done or as told by your doctor. °· Tell your sex partner if you have PID. Your partner may need to be treated. °· Keep all doctor visits. °GET HELP RIGHT AWAY IF:  °· You have a fever. °· You have more belly (abdominal) or lower belly pain. °· You have chills. °· You have pain when you pee (urinate). °· You are not better after 72 hours. °· You have more fluid (discharge) coming from your vagina or fluid that is not normal. °· You need pain medicine from your doctor. °· You throw up (vomit). °· You cannot take your medicines. °· Your partner has a sexually transmitted disease (STD). °MAKE SURE YOU:  °· Understand these instructions. °· Will watch your condition. °· Will get help right away if you are not doing well or get worse. °Document Released: 03/09/2009 Document Revised: 04/07/2013 Document Reviewed: 12/07/2011 °Exit[employer]® Patient Information ©2014 [employer]. ° °

## 2014-02-04 NOTE — Progress Notes (Signed)
Patient ID: Annette Anderson, female   DOB: Feb 03, 1971, 43 y.o.   MRN: [id number]  History:  Ms. Annette AlbeeBridget Anderson is a 43 y.o. [phone] who presents to clinic today for follow-up after inpatient treatment for PID. Patient had negative GC/chlamydia and wet prep results upon admission. She was started on Rx for Flagyl and Doxycycline upon discharge. She still has 2 days of antibiotics left to complete. The patient states that she continues to have N/V/D and lower abdominal cramping. She has had occasional fever as well as high as 100 F. She states she has pressure with a full bladder, but denies dysuria or other UTI symptoms. She states that this is unchanged from previously and was evaluated during her inpatient stay. The patient has an appointment with IM on 02/10/14 for follow-up.    The following portions of the patient's history were reviewed and updated as appropriate: allergies, current medications, past family history, past medical history, past social history, past surgical history and problem list.  Review of Systems:  Pertinent items are noted in HPI.  Objective:  Physical Exam BP 142/95  Pulse 84  Temp(Src) 99.1 F (37.3 C) (Oral)  Ht 5\' 2"  (1.575 m)  Wt 168 lb (76.204 kg)  BMI 30.72 kg/m2  LMP 12/08/2013 GENERAL: Well-developed, well-nourished female in no acute distress.  HEENT: Normocephalic, atraumatic.  LUNGS: Normal effort HEART: Regular rate  EXTREMITIES: No cyanosis, clubbing, or edema  Labs and Imaging Ct Abdomen Pelvis Wo Contrast  01/23/2014   CLINICAL DATA:  Right flank pain  EXAM: CT ABDOMEN AND PELVIS WITHOUT CONTRAST  TECHNIQUE: Multidetector CT imaging of the abdomen and pelvis was performed following the standard protocol without intravenous contrast.  COMPARISON:  None.  FINDINGS: Linear right middle lobe opacity is favored to reflect atelectasis or scarring. Heart size within normal limits. Distal esophageal wall thickening and small hiatal hernia.  Organ evaluation  is limited without intravenous contrast. Within this limitation, no appreciable abnormality of the liver, biliary system, pancreas, spleen, adrenal glands.  Symmetric renal size. No hydroureteronephrosis no definite urinary tract calculi. Unable to follow the ureteral course in their entirety given the decompressed state.  Colonic diverticulosis. No CT evidence for colitis or diverticulitis. Appendix not identified. Small bowel loops are of normal course and caliber. No free intraperitoneal air. No lymphadenopathy.  Normal caliber aorta and branch vessels.  Partially decompressed bladder. Nonspecific appearance to the uterus and adnexa. There is mild stranding in the right adnexa and small amount of associated free fluid.  No acute osseous finding.  IMPRESSION: No hydronephrosis or urinary tract calculi identified.  Appendix not identified.  Mild right adnexal stranding/fluid may be physiologic. Correlate with pelvic ultrasound if concerned for acute pathology.  Distal esophageal wall thickening may reflect esophagitis or occurred.   Electronically Signed   By: Jearld LeschAndrew  DelGaizo M.D.   On: 01/23/2014 06:24   Dg Ribs Unilateral Right  01/11/2014   CLINICAL DATA:  Right anterior rib pain.  EXAM: RIGHT RIBS - 2 VIEW  COMPARISON:  Chest and right rib radiographs performed 12/11/2013  FINDINGS: No displaced rib fractures are seen.  The visualized portions of the lungs are grossly clear. There is no evidence of focal opacification, pleural effusion or pneumothorax. The cardiomediastinal silhouette is within normal limits.  IMPRESSION: No displaced rib fracture seen; the visualized portions of the lungs are clear.   Electronically Signed   By: Roanna RaiderJeffery  Chang M.D.   On: 01/11/2014 07:05   [place] Transvaginal Non-ob  01/23/2014  CLINICAL DATA:  Severe right adnexal pain and tenderness.  EXAM: TRANSABDOMINAL AND TRANSVAGINAL ULTRASOUND OF PELVIS  DOPPLER ULTRASOUND OF OVARIES  TECHNIQUE: Both transabdominal and transvaginal  ultrasound examinations of the pelvis were performed. Transabdominal technique was performed for global imaging of the pelvis including uterus, ovaries, adnexal regions, and pelvic cul-de-sac.  It was necessary to proceed with endovaginal exam following the transabdominal exam to visualize the ovaries and adnexa. Color and duplex Doppler ultrasound was utilized to evaluate blood flow to the ovaries.  COMPARISON:  CT scan dated 01/23/2014  FINDINGS: Uterus  Measurements: 8.2 x 3.7 x 5.5 cm. No fibroids or other mass visualized.  Endometrium  Thickness: 10.7 mm. 3 mm cystic area in the endometrium of unknown etiology. The patient has a negative pregnancy test.  Right ovary  Measurements: 3.4 x 2.2 x 2.7 cm. The adnexal tissues around the right ovary are slightly prominent. There is normal arterial and venous perfusion to the right ovary. The patient was quite tender in the right adnexal region.  Left ovary  Measurements: 2.1 x 1.3 x 1.7 cm. Normal appearance/no adnexal mass. There is normal arterial and venous perfusion to the left ovary.  Pulsed Doppler evaluation of both ovaries demonstrates normal low-resistance arterial and venous waveforms.  Other findings  Small amount of free fluid in the pelvis for.  IMPRESSION: Nonspecific soft tissue prominence around the right ovary suggesting inflammation or infection. Normal perfusion to the ovaries. Small amount of free fluid in the pelvis.   Electronically Signed   By: Geanie Cooley M.D.   On: 01/23/2014 10:42   US Pelvis Complete  01/23/2014   CLINICAL DATA:  Severe right adnexal pain and tenderness.  EXAM: TRANSABDOMINAL AND TRANSVAGINAL ULTRASOUND OF PELVIS  DOPPLER ULTRASOUND OF OVARIES  TECHNIQUE: Both transabdominal and transvaginal ultrasound examinations of the pelvis were performed. Transabdominal technique was performed for global imaging of the pelvis including uterus, ovaries, adnexal regions, and pelvic cul-de-sac.  It was necessary to proceed with  endovaginal exam following the transabdominal exam to visualize the ovaries and adnexa. Color and duplex Doppler ultrasound was utilized to evaluate blood flow to the ovaries.  COMPARISON:  CT scan dated 01/23/2014  FINDINGS: Uterus  Measurements: 8.2 x 3.7 x 5.5 cm. No fibroids or other mass visualized.  Endometrium  Thickness: 10.7 mm. 3 mm cystic area in the endometrium of unknown etiology. The patient has a negative pregnancy test.  Right ovary  Measurements: 3.4 x 2.2 x 2.7 cm. The adnexal tissues around the right ovary are slightly prominent. There is normal arterial and venous perfusion to the right ovary. The patient was quite tender in the right adnexal region.  Left ovary  Measurements: 2.1 x 1.3 x 1.7 cm. Normal appearance/no adnexal mass. There is normal arterial and venous perfusion to the left ovary.  Pulsed Doppler evaluation of both ovaries demonstrates normal low-resistance arterial and venous waveforms.  Other findings  Small amount of free fluid in the pelvis for.  IMPRESSION: Nonspecific soft tissue prominence around the right ovary suggesting inflammation or infection. Normal perfusion to the ovaries. Small amount of free fluid in the pelvis.   Electronically Signed   By: Geanie Cooley M.D.   On: 01/23/2014 10:42   Ct Abdomen Pelvis W Contrast  01/25/2014   CLINICAL DATA:  Persistent right lower quadrant pain question appendicitis  EXAM: CT ABDOMEN AND PELVIS WITH CONTRAST  TECHNIQUE: Multidetector CT imaging of the abdomen and pelvis was performed using the standard protocol following bolus  administration of intravenous contrast. Sagittal and coronal MPR images reconstructed from axial data set.  CONTRAST:  OMNIPAQUE IOHEXOL 300 MG/ML SOLN. Dilute oral contrast.  COMPARISON:  01/23/2014  FINDINGS: Lung bases clear.  Air and fluid within a minimally distended distal thoracic esophagus.  Small splenule anterior to spleen.  Liver, spleen, pancreas, kidneys, and adrenal glands normal  appearance.  Unremarkable bladder in uterus.  Appendix is not localized.  No pericecal inflammatory process.  Inflammatory process identified in right adnexa and cul-de-sac with diffuse infiltration of fat planes.  Rounded fluid attenuation collection with enhancing margins is seen to cul de sac suspicious for abscess, 4.8 x 3.4 x 2.6 cm.  Additional fluid collection is seen in the right adnexa, 3.8 x 2.8 x 4.3 cm with linear central density, suspicious for a dilated fallopian tube looped on itself, less likely abscess.  These inflammatory changes do not extend to the cecum.  Areas of fat are seen in the adnexae which do not show infiltration, similar to previous exam, doubt fat containing masses of the ovaries such as dermoid tumor though not completely excluded since the ovaries are not visualized.  Sigmoid colon incompletely distended, limiting assessment of wall thickness.  Stomach and bowel loops otherwise normal appearance.  No mass, adenopathy, hernia, or acute osseous findings.  IMPRESSION: Appendix is not visualized but no definite pericecal inflammation is seen.  Pelvic inflammatory process in the right adnexa and cul-de-sac most suspicious for pelvic inflammatory disease with a cul de sac abscess as discussed above.  Findings called to Dr. Davina Poke on 02/14/2014 at 1335 hr.   Electronically Signed   By: Ulyses Southward M.D.   On: 01/25/2014 13:35   [place] Art/ven Flow Abd Pelv Doppler  01/23/2014   CLINICAL DATA:  Severe right adnexal pain and tenderness.  EXAM: TRANSABDOMINAL AND TRANSVAGINAL ULTRASOUND OF PELVIS  DOPPLER ULTRASOUND OF OVARIES  TECHNIQUE: Both transabdominal and transvaginal ultrasound examinations of the pelvis were performed. Transabdominal technique was performed for global imaging of the pelvis including uterus, ovaries, adnexal regions, and pelvic cul-de-sac.  It was necessary to proceed with endovaginal exam following the transabdominal exam to visualize the ovaries and adnexa. Color and  duplex Doppler ultrasound was utilized to evaluate blood flow to the ovaries.  COMPARISON:  CT scan dated 01/23/2014  FINDINGS: Uterus  Measurements: 8.2 x 3.7 x 5.5 cm. No fibroids or other mass visualized.  Endometrium  Thickness: 10.7 mm. 3 mm cystic area in the endometrium of unknown etiology. The patient has a negative pregnancy test.  Right ovary  Measurements: 3.4 x 2.2 x 2.7 cm. The adnexal tissues around the right ovary are slightly prominent. There is normal arterial and venous perfusion to the right ovary. The patient was quite tender in the right adnexal region.  Left ovary  Measurements: 2.1 x 1.3 x 1.7 cm. Normal appearance/no adnexal mass. There is normal arterial and venous perfusion to the left ovary.  Pulsed Doppler evaluation of both ovaries demonstrates normal low-resistance arterial and venous waveforms.  Other findings  Small amount of free fluid in the pelvis for.  IMPRESSION: Nonspecific soft tissue prominence around the right ovary suggesting inflammation or infection. Normal perfusion to the ovaries. Small amount of free fluid in the pelvis.   Electronically Signed   By: Geanie Cooley M.D.   On: 01/23/2014 10:42   Dg Abd 2 Views  01/24/2014   CLINICAL DATA:  Right flank pain which began 2 days ago. Nausea and vomiting. Diarrhea.  EXAM:  ABDOMEN - 2 VIEW  COMPARISON:  US PELVIS COMPLETE with US TRANSVAGINAL NON-OB dated 01/23/2014; CT ABD/PELV WO CM dated 01/23/2014  FINDINGS: Bowel gas pattern unremarkable without evidence of obstruction or significant ileus. No evidence of free air or significant air-fluid levels on the erect image. No visible opaque urinary tract calculi. Phleboliths low in both sides of the pelvis. Regional skeleton unremarkable.  IMPRESSION: No acute abdominal abnormality.   Electronically Signed   By: Hulan Saas M.D.   On: 01/24/2014 13:57    Assessment & Plan:  Assessment: 1. Recent h/o PID 2. N/V/D  Plans: 1. Patient records requested from [facility] 2. Patient will make an appointment for an annual exam 3. Patient encouraged to follow-up with IM for N/V/D and fever 4. Rx refill for Percocet given to patient 5. Patient advised to complete course of previously prescribed antibiotics. Discussed common side effects of antibiotics of N/V/D 6. Patient to return to [facility] clinic PRN or for annual exam  Freddi Starr, PA-C 02/04/2014 1:32 PM

## 2014-02-10 ENCOUNTER — Ambulatory Visit: Payer: Self-pay | Admitting: Internal Medicine

## 2014-02-16 ENCOUNTER — Emergency Department (HOSPITAL_COMMUNITY)
Admission: EM | Admit: 2014-02-16 | Discharge: 2014-02-16 | Discharge status: Against Medical Advice | Payer: Self-pay | Attending: Emergency Medicine | Admitting: Emergency Medicine

## 2014-02-16 ENCOUNTER — Encounter (HOSPITAL_COMMUNITY): Payer: Self-pay | Admitting: Emergency Medicine

## 2014-02-16 ENCOUNTER — Inpatient Hospital Stay (HOSPITAL_COMMUNITY)
Admission: AD | Admit: 2014-02-16 | Discharge: 2014-02-17 | Disposition: A | Discharge status: Home / Self Care | Payer: Self-pay | Source: Ambulatory Visit | Attending: Obstetrics and Gynecology | Admitting: Obstetrics and Gynecology

## 2014-02-16 DIAGNOSIS — R197 Diarrhea, unspecified: Secondary | ICD-10-CM | POA: Insufficient documentation

## 2014-02-16 DIAGNOSIS — F17200 Nicotine dependence, unspecified, uncomplicated: Secondary | ICD-10-CM | POA: Insufficient documentation

## 2014-02-16 DIAGNOSIS — K529 Noninfective gastroenteritis and colitis, unspecified: Secondary | ICD-10-CM

## 2014-02-16 DIAGNOSIS — R112 Nausea with vomiting, unspecified: Secondary | ICD-10-CM | POA: Insufficient documentation

## 2014-02-16 DIAGNOSIS — R1031 Right lower quadrant pain: Secondary | ICD-10-CM | POA: Insufficient documentation

## 2014-02-16 DIAGNOSIS — N949 Unspecified condition associated with female genital organs and menstrual cycle: Secondary | ICD-10-CM | POA: Insufficient documentation

## 2014-02-16 DIAGNOSIS — K5289 Other specified noninfective gastroenteritis and colitis: Secondary | ICD-10-CM | POA: Insufficient documentation

## 2014-02-16 DIAGNOSIS — K220 Achalasia of cardia: Secondary | ICD-10-CM | POA: Diagnosis present

## 2014-02-16 HISTORY — DX: Achalasia of cardia: K22.0

## 2014-02-16 HISTORY — DX: Disorder of thyroid, unspecified: E07.9

## 2014-02-16 LAB — CBC WITH DIFFERENTIAL/PLATELET
Basophils Absolute: 0.1 10*3/uL (ref 0.0–0.1)
Basophils Relative: 1 % (ref 0–1)
EOS ABS: 0.2 10*3/uL (ref 0.0–0.7)
EOS PCT: 2 % (ref 0–5)
HCT: 40.5 % (ref 36.0–46.0)
HEMOGLOBIN: 13.2 g/dL (ref 12.0–15.0)
Lymphocytes Relative: 38 % (ref 12–46)
Lymphs Abs: 3.4 10*3/uL (ref 0.7–4.0)
MCH: 25.9 pg — AB (ref 26.0–34.0)
MCHC: 32.6 g/dL (ref 30.0–36.0)
MCV: 79.6 fL (ref 78.0–100.0)
MONOS PCT: 6 % (ref 3–12)
Monocytes Absolute: 0.5 10*3/uL (ref 0.1–1.0)
NEUTROS PCT: 53 % (ref 43–77)
Neutro Abs: 4.7 10*3/uL (ref 1.7–7.7)
Platelets: 374 10*3/uL (ref 150–400)
RBC: 5.09 MIL/uL (ref 3.87–5.11)
RDW: 16.6 % — ABNORMAL HIGH (ref 11.5–15.5)
WBC: 8.9 10*3/uL (ref 4.0–10.5)

## 2014-02-16 LAB — URINALYSIS, ROUTINE W REFLEX MICROSCOPIC
BILIRUBIN URINE: NEGATIVE
Glucose, UA: NEGATIVE mg/dL
HGB URINE DIPSTICK: NEGATIVE
KETONES UR: NEGATIVE mg/dL
Leukocytes, UA: NEGATIVE
NITRITE: NEGATIVE
PROTEIN: NEGATIVE mg/dL
Specific Gravity, Urine: 1.006 (ref 1.005–1.030)
UROBILINOGEN UA: 0.2 mg/dL (ref 0.0–1.0)
pH: 6 (ref 5.0–8.0)

## 2014-02-16 LAB — COMPREHENSIVE METABOLIC PANEL
ALK PHOS: 81 U/L (ref 39–117)
ALT: 16 U/L (ref 0–35)
AST: 27 U/L (ref 0–37)
Albumin: 4 g/dL (ref 3.5–5.2)
BUN: 7 mg/dL (ref 6–23)
CO2: 20 mEq/L (ref 19–32)
Calcium: 9.8 mg/dL (ref 8.4–10.5)
Chloride: 107 mEq/L (ref 96–112)
Creatinine, Ser: 0.71 mg/dL (ref 0.50–1.10)
GLUCOSE: 96 mg/dL (ref 70–99)
POTASSIUM: 3.9 meq/L (ref 3.7–5.3)
Sodium: 145 mEq/L (ref 137–147)
TOTAL PROTEIN: 8.5 g/dL — AB (ref 6.0–8.3)
Total Bilirubin: <0.2 mg/dL — ABNORMAL LOW (ref 0.3–1.2)

## 2014-02-16 LAB — LIPASE, BLOOD: Lipase: 43 U/L (ref 11–59)

## 2014-02-16 MED ORDER — OXYCODONE-ACETAMINOPHEN 5-325 MG PO TABS
1.0000 | ORAL_TABLET | Freq: Once | ORAL | Status: AC
  Administered 2014-02-16: 1 via ORAL
  Filled 2014-02-16: qty 1

## 2014-02-16 NOTE — ED Notes (Signed)
Pt reports that she has been having rlq pain for the past couple of weeks. Reports that she was seen here about a week ago for the same. Pt reports nausea/vomiting, also episodes of diarrhea. States that she has an appt with PCP on Wednesday. Reports pressure with urination.

## 2014-02-17 ENCOUNTER — Inpatient Hospital Stay (HOSPITAL_COMMUNITY): Payer: Self-pay

## 2014-02-17 ENCOUNTER — Emergency Department (HOSPITAL_COMMUNITY): Payer: Self-pay

## 2014-02-17 ENCOUNTER — Encounter (HOSPITAL_COMMUNITY): Payer: Self-pay | Admitting: *Deleted

## 2014-02-17 DIAGNOSIS — K5289 Other specified noninfective gastroenteritis and colitis: Secondary | ICD-10-CM

## 2014-02-17 DIAGNOSIS — K220 Achalasia of cardia: Secondary | ICD-10-CM | POA: Diagnosis present

## 2014-02-17 DIAGNOSIS — R109 Unspecified abdominal pain: Secondary | ICD-10-CM

## 2014-02-17 LAB — POCT PREGNANCY, URINE: Preg Test, Ur: NEGATIVE

## 2014-02-17 MED ORDER — OXYCODONE-ACETAMINOPHEN 5-325 MG PO TABS
2.0000 | ORAL_TABLET | Freq: Once | ORAL | Status: AC
  Administered 2014-02-17: 2 via ORAL
  Filled 2014-02-17: qty 2

## 2014-02-17 MED ORDER — DICYCLOMINE HCL 20 MG PO TABS: 20.0000 mg | ORAL_TABLET | Freq: Three times a day (TID) | ORAL | Status: DC

## 2014-02-17 MED ORDER — ONDANSETRON 8 MG/NS 50 ML IVPB
8.0000 mg | Freq: Once | INTRAVENOUS | Status: AC
  Administered 2014-02-17: 8 mg via INTRAVENOUS
  Filled 2014-02-17: qty 8

## 2014-02-17 MED ORDER — DICYCLOMINE HCL 20 MG PO TABS
20.0000 mg | ORAL_TABLET | Freq: Once | ORAL | Status: AC
  Administered 2014-02-17: 20 mg via ORAL
  Filled 2014-02-17: qty 1

## 2014-02-17 MED ORDER — ONDANSETRON HCL 4 MG/2ML IJ SOLN: 8.0000 mg | Freq: Once | INTRAMUSCULAR | Status: DC

## 2014-02-17 MED ORDER — SODIUM CHLORIDE 0.9 % IV SOLN
25.0000 mg | Freq: Once | INTRAVENOUS | Status: DC
  Filled 2014-02-17: qty 1

## 2014-02-17 MED ORDER — OXYCODONE-ACETAMINOPHEN 5-325 MG PO TABS: 1.0000 | ORAL_TABLET | ORAL | Status: DC | PRN

## 2014-02-17 MED ORDER — HYDROMORPHONE HCL PF 2 MG/ML IJ SOLN
2.0000 mg | Freq: Once | INTRAMUSCULAR | Status: AC
  Administered 2014-02-17: 2 mg via INTRAMUSCULAR
  Filled 2014-02-17: qty 1

## 2014-02-17 MED ORDER — HYDROMORPHONE HCL PF 2 MG/ML IJ SOLN: 2.0000 mg | Freq: Once | INTRAMUSCULAR | Status: DC

## 2014-02-17 MED ORDER — LACTATED RINGERS IV SOLN
INTRAVENOUS | Status: DC
  Administered 2014-02-17: 02:00:00 via INTRAVENOUS

## 2014-02-17 NOTE — MAU Provider Note (Signed)
Chief Complaint: No chief complaint on file.   First Provider Initiated Contact with Patient 02/17/14 0126     SUBJECTIVE HPI: Annette Anderson is a 43 y.o. [id number] female who presents with right lower quadrant pain, nausea, vomiting and diarrhea that started over a month ago. She was hospitalized at the end of January with a diagnosis of PID and received IV antibiotics. Pain improved significantly, but there is no improvement in GI symptoms and pain has become severe. On CTs is performed 01/23/2014 and 01/25/2014 the appendix was not directly visualized, but there was no evidence of appendicitis. She rates her pain 10/10 on pain scale described as intermittent, twisting pain. Unsure if her relates to GI symptoms. Patient reports having watery bowel movements every time she goes to the bathroom. Stool cultures were not done at her hospitalization in January because she had a brief reprieve from her diarrhea. Patient had not been on antibiotics or hospitalized prior to the onset of diarrhea.  Patient has a followup visit scheduled with internal medicine tomorrow.  Past Medical History  Diagnosis Date  . Thyroid disease   . Achalasia    OB History  Gravida Para Term Preterm AB SAB TAB Ectopic Multiple Living  3 3 3  0 0 0 0 0 0 3    # Outcome Date GA Lbr Len/2nd Weight Sex Delivery Anes PTL Lv  3 TRM 04/03/97 [redacted]w[redacted]d   F CS   Y  2 TRM 10/10/95 [redacted]w[redacted]d   F CS   Y  1 TRM 06/28/93 [redacted]w[redacted]d   F CS   Y     Past Surgical History  Procedure Laterality Date  . Cesarean section     History   Social History  . Marital Status: Single    Spouse Name: N/A    Number of Children: N/A  . Years of Education: N/A   Occupational History  . Not on file.   Social History Main Topics  . Smoking status: Current Some Day Smoker    Types: Cigarettes  . Smokeless tobacco: Never Used  . Alcohol Use: Yes  . Drug Use: No  . Sexual Activity: Not Currently   Other Topics Concern  . Not on file   Social  History Narrative  . No narrative on file   No current facility-administered medications on file prior to encounter.   Current Outpatient Prescriptions on File Prior to Encounter  Medication Sig Dispense Refill  . levothyroxine (SYNTHROID, LEVOTHROID) 150 MCG tablet Take 150 mcg by mouth daily before breakfast.      . promethazine (PHENERGAN) 12.5 MG tablet Take 12.5 mg by mouth every 6 (six) hours as needed for nausea or vomiting.      . calcium carbonate (TUMS EX) 750 MG chewable tablet Chew 2 tablets by mouth daily as needed for heartburn.      . Cyanocobalamin (VITAMIN B 12 PO) Take 1 tablet by mouth daily.      Marland Kitchen ibuprofen (ADVIL,MOTRIN) 200 MG tablet Take 400-800 mg by mouth every 6 (six) hours as needed for moderate pain.        Allergies  Allergen Reactions  . Bee Venom Anaphylaxis  . Sulfa Antibiotics Anaphylaxis and Hives  . Naproxen Hives    ROS: Pertinent items in HPI. Negative for fever, chills, constipation, urinary complaints, intermenstrual bleeding or vaginal discharge. States she is feeling feeling hot during the last few days.  OBJECTIVE Blood pressure 146/95, pulse 88, temperature 98.7 F (37.1 C), temperature source Oral, resp. rate  20, height 5\' 2"  (1.575 m), weight 75.751 kg (167 lb), last menstrual period 12/08/2013. GENERAL: Well-developed, well-nourished, overweight female in moderate distress.  HEENT: Normocephalic HEART: normal rate RESP: normal effort ABDOMEN: Soft, moderate, generalized abdominal tenderness. Positive bowel sounds Negative rebound, negative mass, negative CVA tenderness. EXTREMITIES: Nontender, no edema NEURO: Alert and oriented SPECULUM EXAM: NEFG, scant physiologic discharge, no blood noted, cervix clean BIMANUAL: cervix closed; uterus normal size, no adnexal tenderness or masses. No cervical motion tenderness.  LAB RESULTS Results for orders placed during the hospital encounter of 02/16/14 (from the past 24 hour(s))  POCT  PREGNANCY, URINE     Status: None   Collection Time    02/17/14 12:04 AM      Result Value Ref Range   Preg Test, Ur NEGATIVE  NEGATIVE    IMAGING Ct Abdomen Pelvis Wo Contrast  01/23/2014   CLINICAL DATA:  Right flank pain  EXAM: CT ABDOMEN AND PELVIS WITHOUT CONTRAST  TECHNIQUE: Multidetector CT imaging of the abdomen and pelvis was performed following the standard protocol without intravenous contrast.  COMPARISON:  None.  FINDINGS: Linear right middle lobe opacity is favored to reflect atelectasis or scarring. Heart size within normal limits. Distal esophageal wall thickening and small hiatal hernia.  Organ evaluation is limited without intravenous contrast. Within this limitation, no appreciable abnormality of the liver, biliary system, pancreas, spleen, adrenal glands.  Symmetric renal size. No hydroureteronephrosis no definite urinary tract calculi. Unable to follow the ureteral course in their entirety given the decompressed state.  Colonic diverticulosis. No CT evidence for colitis or diverticulitis. Appendix not identified. Small bowel loops are of normal course and caliber. No free intraperitoneal air. No lymphadenopathy.  Normal caliber aorta and branch vessels.  Partially decompressed bladder. Nonspecific appearance to the uterus and adnexa. There is mild stranding in the right adnexa and small amount of associated free fluid.  No acute osseous finding.  IMPRESSION: No hydronephrosis or urinary tract calculi identified.  Appendix not identified.  Mild right adnexal stranding/fluid may be physiologic. Correlate with pelvic ultrasound if concerned for acute pathology.  Distal esophageal wall thickening may reflect esophagitis or occurred.   Electronically Signed   By: Jearld Lesch M.D.   On: 01/23/2014 06:24   US Transvaginal Non-ob  02/17/2014   CLINICAL DATA:  43 year old female with right pelvic pain.  EXAM: TRANSVAGINAL ULTRASOUND OF PELVIS  TECHNIQUE: Transvaginal ultrasound  examination of the pelvis was performed including evaluation of the uterus, ovaries, adnexal regions, and pelvic cul-de-sac.  COMPARISON:  01/23/2014 ultrasound and 01/25/2014 CT  FINDINGS: Uterus  Measurements: Anteverted measuring 7.4 x 3.5 x 5 cm. No fibroids or other mass visualized.  Endometrium  Thickness: 1.7 mm.  No focal abnormality visualized.  Right ovary  Measurements: 2.4 x 1 x 1.2 cm. Normal appearance/no adnexal mass.  Left ovary  Measurements: 2.1 x 1.3 x 1 cm. Normal appearance/no adnexal mass.  Other findings:  No free fluid or adnexal mass.  IMPRESSION: Normal pelvic ultrasound.   Electronically Signed   By: Laveda Abbe M.D.   On: 02/17/2014 01:11   US Transvaginal Non-ob  01/23/2014   CLINICAL DATA:  Severe right adnexal pain and tenderness.  EXAM: TRANSABDOMINAL AND TRANSVAGINAL ULTRASOUND OF PELVIS  DOPPLER ULTRASOUND OF OVARIES  TECHNIQUE: Both transabdominal and transvaginal ultrasound examinations of the pelvis were performed. Transabdominal technique was performed for global imaging of the pelvis including uterus, ovaries, adnexal regions, and pelvic cul-de-sac.  It was necessary to proceed with endovaginal exam  following the transabdominal exam to visualize the ovaries and adnexa. Color and duplex Doppler ultrasound was utilized to evaluate blood flow to the ovaries.  COMPARISON:  CT scan dated 01/23/2014  FINDINGS: Uterus  Measurements: 8.2 x 3.7 x 5.5 cm. No fibroids or other mass visualized.  Endometrium  Thickness: 10.7 mm. 3 mm cystic area in the endometrium of unknown etiology. The patient has a negative pregnancy test.  Right ovary  Measurements: 3.4 x 2.2 x 2.7 cm. The adnexal tissues around the right ovary are slightly prominent. There is normal arterial and venous perfusion to the right ovary. The patient was quite tender in the right adnexal region.  Left ovary  Measurements: 2.1 x 1.3 x 1.7 cm. Normal appearance/no adnexal mass. There is normal arterial and venous perfusion to  the left ovary.  Pulsed Doppler evaluation of both ovaries demonstrates normal low-resistance arterial and venous waveforms.  Other findings  Small amount of free fluid in the pelvis for.  IMPRESSION: Nonspecific soft tissue prominence around the right ovary suggesting inflammation or infection. Normal perfusion to the ovaries. Small amount of free fluid in the pelvis.   Electronically Signed   By: Geanie Cooley M.D.   On: 01/23/2014 10:42   US Pelvis Complete  01/23/2014   CLINICAL DATA:  Severe right adnexal pain and tenderness.  EXAM: TRANSABDOMINAL AND TRANSVAGINAL ULTRASOUND OF PELVIS  DOPPLER ULTRASOUND OF OVARIES  TECHNIQUE: Both transabdominal and transvaginal ultrasound examinations of the pelvis were performed. Transabdominal technique was performed for global imaging of the pelvis including uterus, ovaries, adnexal regions, and pelvic cul-de-sac.  It was necessary to proceed with endovaginal exam following the transabdominal exam to visualize the ovaries and adnexa. Color and duplex Doppler ultrasound was utilized to evaluate blood flow to the ovaries.  COMPARISON:  CT scan dated 01/23/2014  FINDINGS: Uterus  Measurements: 8.2 x 3.7 x 5.5 cm. No fibroids or other mass visualized.  Endometrium  Thickness: 10.7 mm. 3 mm cystic area in the endometrium of unknown etiology. The patient has a negative pregnancy test.  Right ovary  Measurements: 3.4 x 2.2 x 2.7 cm. The adnexal tissues around the right ovary are slightly prominent. There is normal arterial and venous perfusion to the right ovary. The patient was quite tender in the right adnexal region.  Left ovary  Measurements: 2.1 x 1.3 x 1.7 cm. Normal appearance/no adnexal mass. There is normal arterial and venous perfusion to the left ovary.  Pulsed Doppler evaluation of both ovaries demonstrates normal low-resistance arterial and venous waveforms.  Other findings  Small amount of free fluid in the pelvis for.  IMPRESSION: Nonspecific soft tissue  prominence around the right ovary suggesting inflammation or infection. Normal perfusion to the ovaries. Small amount of free fluid in the pelvis.   Electronically Signed   By: Geanie Cooley M.D.   On: 01/23/2014 10:42   Ct Abdomen Pelvis W Contrast  01/25/2014   CLINICAL DATA:  Persistent right lower quadrant pain question appendicitis  EXAM: CT ABDOMEN AND PELVIS WITH CONTRAST  TECHNIQUE: Multidetector CT imaging of the abdomen and pelvis was performed using the standard protocol following bolus administration of intravenous contrast. Sagittal and coronal MPR images reconstructed from axial data set.  CONTRAST:  OMNIPAQUE IOHEXOL 300 MG/ML SOLN. Dilute oral contrast.  COMPARISON:  01/23/2014  FINDINGS: Lung bases clear.  Air and fluid within a minimally distended distal thoracic esophagus.  Small splenule anterior to spleen.  Liver, spleen, pancreas, kidneys, and adrenal glands normal appearance.  Unremarkable bladder in uterus.  Appendix is not localized.  No pericecal inflammatory process.  Inflammatory process identified in right adnexa and cul-de-sac with diffuse infiltration of fat planes.  Rounded fluid attenuation collection with enhancing margins is seen to cul de sac suspicious for abscess, 4.8 x 3.4 x 2.6 cm.  Additional fluid collection is seen in the right adnexa, 3.8 x 2.8 x 4.3 cm with linear central density, suspicious for a dilated fallopian tube looped on itself, less likely abscess.  These inflammatory changes do not extend to the cecum.  Areas of fat are seen in the adnexae which do not show infiltration, similar to previous exam, doubt fat containing masses of the ovaries such as dermoid tumor though not completely excluded since the ovaries are not visualized.  Sigmoid colon incompletely distended, limiting assessment of wall thickness.  Stomach and bowel loops otherwise normal appearance.  No mass, adenopathy, hernia, or acute osseous findings.  IMPRESSION: Appendix is not visualized  but no definite pericecal inflammation is seen.  Pelvic inflammatory process in the right adnexa and cul-de-sac most suspicious for pelvic inflammatory disease with a cul de sac abscess as discussed above.  Findings called to Dr. Davina Pokeornet on 02/14/2014 at 1335 hr.   Electronically Signed   By: Ulyses SouthwardMark  Boles M.D.   On: 01/25/2014 13:35   [place] Art/ven Flow Abd Pelv Doppler  02/17/2014   CLINICAL DATA:  43 year old female with pelvic pain.  EXAM: DOPPLER ULTRASOUND OF OVARIES  TECHNIQUE: Color and duplex Doppler ultrasound was utilized to evaluate blood flow to the ovaries.  COMPARISON:  02/17/2014 and prior ultrasounds  FINDINGS: Pulsed Doppler evaluation of both ovaries demonstrates normal low-resistance arterial and venous waveforms.  IMPRESSION: No evidence of ovarian torsion.   Electronically Signed   By: Laveda AbbeJeff  Hu M.D.   On: 02/17/2014 02:52   [place] Art/ven Flow Abd Pelv Doppler  01/23/2014   CLINICAL DATA:  Severe right adnexal pain and tenderness.  EXAM: TRANSABDOMINAL AND TRANSVAGINAL ULTRASOUND OF PELVIS  DOPPLER ULTRASOUND OF OVARIES  TECHNIQUE: Both transabdominal and transvaginal ultrasound examinations of the pelvis were performed. Transabdominal technique was performed for global imaging of the pelvis including uterus, ovaries, adnexal regions, and pelvic cul-de-sac.  It was necessary to proceed with endovaginal exam following the transabdominal exam to visualize the ovaries and adnexa. Color and duplex Doppler ultrasound was utilized to evaluate blood flow to the ovaries.  COMPARISON:  CT scan dated 01/23/2014  FINDINGS: Uterus  Measurements: 8.2 x 3.7 x 5.5 cm. No fibroids or other mass visualized.  Endometrium  Thickness: 10.7 mm. 3 mm cystic area in the endometrium of unknown etiology. The patient has a negative pregnancy test.  Right ovary  Measurements: 3.4 x 2.2 x 2.7 cm. The adnexal tissues around the right ovary are slightly prominent. There is normal arterial and venous perfusion to the right  ovary. The patient was quite tender in the right adnexal region.  Left ovary  Measurements: 2.1 x 1.3 x 1.7 cm. Normal appearance/no adnexal mass. There is normal arterial and venous perfusion to the left ovary.  Pulsed Doppler evaluation of both ovaries demonstrates normal low-resistance arterial and venous waveforms.  Other findings  Small amount of free fluid in the pelvis for.  IMPRESSION: Nonspecific soft tissue prominence around the right ovary suggesting inflammation or infection. Normal perfusion to the ovaries. Small amount of free fluid in the pelvis.   Electronically Signed   By: Geanie CooleyJim  Maxwell M.D.   On: 01/23/2014 10:42   Dg  Abd 2 Views  01/24/2014   CLINICAL DATA:  Right flank pain which began 2 days ago. Nausea and vomiting. Diarrhea.  EXAM: ABDOMEN - 2 VIEW  COMPARISON:  US PELVIS COMPLETE with US TRANSVAGINAL NON-OB dated 01/23/2014; CT ABD/PELV WO CM dated 01/23/2014  FINDINGS: Bowel gas pattern unremarkable without evidence of obstruction or significant ileus. No evidence of free air or significant air-fluid levels on the erect image. No visible opaque urinary tract calculi. Phleboliths low in both sides of the pelvis. Regional skeleton unremarkable.  IMPRESSION: No acute abdominal abnormality.   Electronically Signed   By: Hulan Saas M.D.   On: 01/24/2014 13:57    MAU COURSE Pelvic ultrasound, UA, Dilaudid. CBC and UA from ED tonight reviewed.  Pain decreased to 7/10 on pain scale after Dilaudid. Patient relaxed, but now transferring nausea. Zofran ordered.  Nausea resolved with Zofran. Pelvic ultrasound and labs all essentially normal. Right adnexal infection/inflammation and leukocytosis resolved since January hospitalization. No evidence of GYN etiology for pain present. Strongly suspect that pain is GI-related. Patient requesting more pain medication. Bentyl and Percocet given. Recommended stool cultures, patient unable to have a bowel movement.  ASSESSMENT 1. Gastroenteritis     PLAN Discharge home in stable condition per consult with Dr. Jolayne Panther.  Discuss stool cultures with internal medicine doctor appointment tomorrow.  Follow-up Information   Follow up with LI, NA, MD On 02/18/2014. (As scheduled)    Specialty:  Internal Medicine   Contact information:   [address] [place] [address] [phone]       Follow up with [facility]. (As needed in emergencies)    Contact information:   [address] [place] [address]        Medication List         calcium carbonate 750 MG chewable tablet  Commonly known as:  TUMS EX  Chew 2 tablets by mouth daily as needed for heartburn.     dicyclomine 20 MG tablet  Commonly known as:  BENTYL  Take 1 tablet (20 mg total) by mouth 4 (four) times daily -  before meals and at bedtime.     ibuprofen 200 MG tablet  Commonly known as:  ADVIL,MOTRIN  Take 400-800 mg by mouth every 6 (six) hours as needed for moderate pain.     levothyroxine 150 MCG tablet  Commonly known as:  SYNTHROID, LEVOTHROID  Take 150 mcg by mouth daily before breakfast.     oxyCODONE-acetaminophen 5-325 MG per tablet  Commonly known as:  PERCOCET/ROXICET  Take 1-2 tablets by mouth every 4 (four) hours as needed for severe pain.     promethazine 12.5 MG tablet  Commonly known as:  PHENERGAN  Take 12.5 mg by mouth every 6 (six) hours as needed for nausea or vomiting.     VITAMIN B 12 PO  Take 1 tablet by mouth daily.       [place], [place] 02/17/2014  6:24 AM

## 2014-02-17 NOTE — ED Notes (Signed)
Called x's 3 without response 

## 2014-02-17 NOTE — MAU Note (Addendum)
PT SAYS SHE WAS AT [facility] TONIGHT   -   SHE WENT THERE AT 530PM-  FOR ABD  PAIN-   SAYS  SHE WAS IN  HOSPITAL- [facility]  IN JAN X 6 DAYS-   PELVIC INFLAMM AND TUMORS  AND NOW SHE SAYS PAIN  IS NOT ANY BETTER.  SHE WENT TO  CLINIC DOWNSTAIRS     LAST Tuesday-  SAID  TO CONTINUE ANTX.  SHE IS EATING YOGURT       [facility]   [facility]AS [facility]COCET.    SHE TOOK   IBUPROFEN THIS  10AM.    BABIES DEL IN [place].  HAS AN APPOINTMENT  ON WED  WITH INTERNAL DR -  PRIMARY DR.    Michela PitcherHERE RIGHT SIDE FEELS LIKE SOMEONE IS  TWISTING SOMETHING

## 2014-02-17 NOTE — Discharge Instructions (Signed)
Talk to your internal medicine doctor about stool cultures.   Chronic Diarrhea Diarrhea is frequent loose and watery bowel movements. It can cause you to feel weak and dehydrated. Dehydration can cause you to become tired and thirsty and to have a dry mouth, decreased urination, and dark yellow urine. Diarrhea is a sign of another problem, most often an infection that will not last long. In most cases, diarrhea lasts 2 3 days. Diarrhea that lasts longer than 4 weeks is called long-lasting (chronic) diarrhea. It is important to treat your diarrhea as directed by your health care provider to lessen or prevent future episodes of diarrhea.  CAUSES  There are many causes of chronic diarrhea. The following are some possible causes:   Gastrointestinal infections caused by viruses, bacteria, or parasites.   Food poisoning or food allergies.   Certain medicines, such as antibiotics, chemotherapy, and laxatives.   Artificial sweeteners and fructose.   Digestive disorders, such as celiac disease and inflammatory bowel diseases.   Irritable bowel syndrome.  Some disorders of the pancreas.  Disorders of the thyroid.  Reduced blood flow to the intestines.  Cancer. Sometimes the cause of chronic diarrhea is unknown. RISK FACTORS  Having a severely weakened immune system, such as from HIV or AIDS.   Taking certain types of cancer-fighting drugs (such as with chemotherapy) or other medicines.   Having had a recent organ transplant.   Having a portion of the stomach or small bowel removed.   Traveling to countries where food and water supplies are often contaminated.  SYMPTOMS  In addition to frequent, loose stools, diarrhea may cause:   Cramping.   Abdominal pain.   Nausea.   Fever.  Fatigue.  Urgent need to use the bathroom.  Loss of bowel control. DIAGNOSIS  Your health care provider must take a careful history and perform a physical exam. Tests given are based on  your symptoms and history. Tests may include:   Blood or stool tests. Three or more stool samples may be examined. Stool cultures may be used to test for bacteria or parasites.   X-rays.   A procedure in which a thin tube is inserted into the mouth or rectum (endoscopy). This allows the health care provider to look inside the intestine.  TREATMENT   Treatment is aimed at correcting the cause of the diarrhea when possible.  Diarrhea caused by an infection can often be treated with antibiotics.   Diarrhea not caused by an infection may require you to take long-term medicine or have surgery. Specific treatment should be discussed with your health care provider.   If the cause cannot be determined, treatment aims to relieve symptoms and prevent dehydration. Serious health problems can occur if you do not maintain proper fluid levels. Treatment may include:   Taking an oral rehydration solution (ORS) .  Not drinking beverages that contain caffeine (such as tea, coffee, and soft drinks).   Not drinking alcohol.   Maintaining well-balanced nutrition to help you recover faster. HOME CARE INSTRUCTIONS   Drink enough fluids to keep urine clear or pale yellow. Drink 1 cup (8 oz) of fluid for each diarrhea episode. Avoid fluids that contain simple sugars, fruit juices, whole milk products, and sodas. Hydrate with an ORS. You may purchase the ORS or prepare it at home by mixing the following ingredients together:     tsp (1.7 3  mL) table salt.    tsp (3  mL) baking soda.    tsp (1.7  mL) salt substitute containing potassium chloride.   1 tbsp (20 mL) sugar.   4.2 c (1 L) of water.   Certain foods and beverages may increase the speed at which food moves through the gastrointestinal (GI) tract. These foods and beverages should be avoided. They include:   Caffeinated and alcoholic beverages.   High-fiber foods, such as raw fruits and vegetables, nuts, seeds, and whole  grain breads and cereals.   Foods and beverages sweetened with sugar alcohols, such as xylitol, sorbitol, and mannitol.   Some foods may be well tolerated and may help thicken stool. These include:  Starchy foods, such as rice, toast, pasta, low-sugar cereal, oatmeal, grits, baked potatoes, crackers, and bagels.   Bananas.   Applesauce.   Add probiotic-rich foods to help increase healthy bacteria in the GI tract. These include yogurt and fermented milk products.   Wash your hands well after each diarrhea episode.   Only take over-the-counter or prescription medicines as directed by your health care provider.   Take a warm bath to relieve any burning or pain from frequent diarrhea episodes. SEEK MEDICAL CARE IF:   You are not urinating as often.  Your urine is a dark color.   You become very tired or dizzy.   You have severe pain in the abdomen or rectum.   Your have blood or pus in your stools.   Your stools look black and tarry.  SEEK IMMEDIATE MEDICAL CARE IF:   You are unable to keep fluids down.   You have persistent vomiting.   You have blood in your stool.  Your stools are black and tarry.   You do not urinate in 6 8 hours, or there is only a small amount of very dark urine.   You have abdominal pain that increases or localizes.   You have weakness, dizziness, confusion, or lightheadedness.   You have a severe headache.   Your diarrhea gets worse or does not get better.   You have a fever or persistent symptoms for more than 2 3 days.   You have a fever and your symptoms suddenly get worse.  MAKE SURE YOU:   Understand these instructions.  Will watch your condition.  Will get help right away if you are not doing well or get worse. Document Released: 03/02/2004 Document Revised: 08/13/2013 Document Reviewed: 06/05/2013 [facility] Patient Information 2014 [place], [place].   Diet for Diarrhea, Adult Frequent, runny stools  (diarrhea) may be caused or worsened by food or drink. Diarrhea may be relieved by changing your diet. Since diarrhea can last up to 7 days, it is easy for you to lose too much fluid from the body and become dehydrated. Fluids that are lost need to be replaced. Along with a modified diet, make sure you drink enough fluids to keep your urine clear or pale yellow. DIET INSTRUCTIONS  Ensure adequate fluid intake (hydration): have 1 cup (8 oz) of fluid for each diarrhea episode. Avoid fluids that contain simple sugars or sports drinks, fruit juices, whole milk products, and sodas. Your urine should be clear or pale yellow if you are drinking enough fluids. Hydrate with an oral rehydration solution that you can purchase at pharmacies, retail stores, and online. You can prepare an oral rehydration solution at home by mixing the following ingredients together:    tsp table salt.   tsp baking soda.   tsp salt substitute containing potassium chloride.  1  tablespoons sugar.  1 L (34 oz) of water.  Certain foods and beverages may increase the speed at which food moves through the gastrointestinal (GI) tract. These foods and beverages should be avoided and include:  Caffeinated and alcoholic beverages.  High-fiber foods, such as raw fruits and vegetables, nuts, seeds, and whole grain breads and cereals.  Foods and beverages sweetened with sugar alcohols, such as xylitol, sorbitol, and mannitol.  Some foods may be well tolerated and may help thicken stool including:  Starchy foods, such as rice, toast, pasta, low-sugar cereal, oatmeal, grits, baked potatoes, crackers, and bagels.   Bananas.   Applesauce.  Add probiotic-rich foods to help increase healthy bacteria in the GI tract, such as yogurt and fermented milk products. RECOMMENDED FOODS AND BEVERAGES Starches Choose foods with less than 2 g of fiber per serving.  Recommended:  White, [place], and pita breads, plain rolls, buns, bagels.  Plain muffins, matzo. Soda, saltine, or graham crackers. Pretzels, melba toast, zwieback. Cooked cereals made with water: cornmeal, farina, cream cereals. Dry cereals: refined corn, wheat, rice. Potatoes prepared any way without skins, refined macaroni, spaghetti, noodles, refined rice.  Avoid:  Bread, rolls, or crackers made with whole wheat, multi-grains, rye, bran seeds, nuts, or coconut. Corn tortillas or taco shells. Cereals containing whole grains, multi-grains, bran, coconut, nuts, raisins. Cooked or dry oatmeal. Coarse wheat cereals, granola. Cereals advertised as "high-fiber." Potato skins. Whole grain pasta, wild or brown rice. Popcorn. Sweet potatoes, yams. Sweet rolls, doughnuts, waffles, pancakes, sweet breads. Vegetables  Recommended: Strained tomato and vegetable juices. Most well-cooked and canned vegetables without seeds. Fresh: Tender lettuce, cucumber without the skin, cabbage, spinach, bean sprouts.  Avoid: Fresh, cooked, or canned: Artichokes, baked beans, beet greens, broccoli, Brussels sprouts, corn, kale, legumes, peas, sweet potatoes. Cooked: Green or red cabbage, spinach. Avoid large servings of any vegetables because vegetables shrink when cooked, and they contain more fiber per serving than fresh vegetables. Fruit  Recommended: Cooked or canned: Apricots, applesauce, cantaloupe, cherries, fruit cocktail, grapefruit, grapes, kiwi, mandarin oranges, peaches, pears, plums, watermelon. Fresh: Apples without skin, ripe banana, grapes, cantaloupe, cherries, grapefruit, peaches, oranges, plums. Keep servings limited to  cup or 1 piece.  Avoid: Fresh: Apples with skin, apricots, mangoes, pears, raspberries, strawberries. Prune juice, stewed or dried prunes. Dried fruits, raisins, dates. Large servings of all fresh fruits. Protein  Recommended: Ground or well-cooked tender beef, ham, veal, lamb, pork, or poultry. Eggs. Fish, oysters, shrimp, lobster, other seafoods. Liver, organ  meats.  Avoid: Tough, fibrous meats with gristle. Peanut butter, smooth or chunky. Cheese, nuts, seeds, legumes, dried peas, beans, lentils. Dairy  Recommended: Yogurt, lactose-free milk, kefir, drinkable yogurt, buttermilk, soy milk, or plain hard cheese.  Avoid: Milk, chocolate milk, beverages made with milk, such as milkshakes. Soups  Recommended: Bouillon, broth, or soups made from allowed foods. Any strained soup.  Avoid: Soups made from vegetables that are not allowed, cream or milk-based soups. Desserts and Sweets  Recommended: Sugar-free gelatin, sugar-free frozen ice pops made without sugar alcohol.  Avoid: Plain cakes and cookies, pie made with fruit, pudding, custard, cream pie. Gelatin, fruit, ice, sherbet, frozen ice pops. Ice cream, ice milk without nuts. Plain hard candy, honey, jelly, molasses, syrup, sugar, chocolate syrup, gumdrops, marshmallows. Fats and Oils  Recommended: Limit fats to less than 8 tsp per day.  Avoid: Seeds, nuts, olives, avocados. Margarine, butter, cream, mayonnaise, salad oils, plain salad dressings. Plain gravy, crisp bacon without rind. Beverages  Recommended: Water, decaffeinated teas, oral rehydration solutions, sugar-free beverages not sweetened with sugar alcohols.  Avoid: Fruit juices, caffeinated beverages (coffee, tea, soda), alcohol, sports drinks, or lemon-lime soda. Condiments  Recommended: Ketchup, mustard, horseradish, vinegar, cocoa powder. Spices in moderation: allspice, basil, bay leaves, celery powder or leaves, cinnamon, cumin powder, curry powder, ginger, mace, marjoram, onion or garlic powder, oregano, paprika, parsley flakes, ground pepper, rosemary, sage, savory, tarragon, thyme, turmeric.  Avoid: Coconut, honey. Document Released: 03/02/2004 Document Revised: 09/04/2012 Document Reviewed: 04/26/2012 [facility] Patient Information 2014 [place], [place].

## 2014-02-18 ENCOUNTER — Ambulatory Visit: Payer: Self-pay | Admitting: Internal Medicine

## 2014-02-18 NOTE — [facility] Provider Note (Signed)
Attestation of Attending Supervision of Advanced Practitioner (CNM/NP): Evaluation and management procedures were performed by the Advanced Practitioner under my supervision and collaboration.  I have reviewed the Advanced Practitioner's note and chart, and I agree with the management and plan.  Annette Anderson 02/18/2014 8:33 AM

## 2014-02-23 ENCOUNTER — Encounter: Payer: Self-pay | Admitting: Internal Medicine

## 2014-02-23 ENCOUNTER — Ambulatory Visit (INDEPENDENT_AMBULATORY_CARE_PROVIDER_SITE_OTHER): Payer: BC Managed Care – PPO | Admitting: Internal Medicine

## 2014-02-23 VITALS — BP 144/85 | HR 82 | Temp 98.8°F | Wt 171.0 lb

## 2014-02-23 DIAGNOSIS — E039 Hypothyroidism, unspecified: Secondary | ICD-10-CM

## 2014-02-23 DIAGNOSIS — R109 Unspecified abdominal pain: Secondary | ICD-10-CM

## 2014-02-23 MED ORDER — LOPERAMIDE HCL 2 MG PO CAPS: 2.0000 mg | ORAL_CAPSULE | ORAL | Status: DC | PRN

## 2014-02-23 MED ORDER — OXYCODONE-ACETAMINOPHEN 5-325 MG PO TABS: 1.0000 | ORAL_TABLET | ORAL | Status: DC | PRN

## 2014-02-23 NOTE — Patient Instructions (Addendum)
We will check on your thyroid tests today. We will get you into a GI doctor when you get your insurance card. Just call our office when it arrives and you may need to bring in a copy. Ask them to send Dr. Dorise Hiss a message and I will get your referral started to see the GI doctor.   We will try to eliminate gluten from your diet to see if that helps with your pain and diarrhea. Increasing the amount of fiber in your diet or fiber supplement (like metamucil) may be helpful in decreasing how watery your stools are.   Come back in 2-3 months or sooner if you are having problems. Call us at [phone] with any problems or questions.   Gluten-Free Diet Gluten is a protein found in many grains. Gluten is present in wheat, rye, and barley. Gluten from wheat, rye, and barley protein interferes with the absorption of food in people with gluten sensitivity. It may also cause intestinal injury when eaten by individuals with gluten sensitivity.  A sample piece (biopsy) of the small intestine is usually required for a positive diagnosis of gluten sensitivity. Dietary treatment consists of eliminating foods and food ingredients from wheat, rye, and barley. When these are taken out of the diet completely, most people regain function of the small intestine. Strict compliance is important even during symptom-free periods. People with gluten sensitivity need to be on a gluten-free diet for a lifetime. During the first stages of treatment, some people will also need to restrict dairy products that contain lactose, which is a naturally occurring sugar. Lactose is difficult to absorb when the small intestines are damaged (lactose intolerance).  WHO NEEDS THIS DIET Some people who have certain diseases need to be on a gluten-free diet. These diseases include:  Celiac disease.  Nontropical sprue.  Gluten-sensitive enteropathy.  Dermatitis herpetiformis. SPECIAL NOTES  Read all labels because gluten may have been  added as an incidental ingredient. Words to check for on the label include: flour, starch, durum flour, graham flour, phosphated flour, self-rising flour, semolina, farina, modified food starch, cereal, thickening, fillers, emulsifiers, any kind of malt flavoring, and hydrolyzed vegetable protein. A registered dietician can help you identify possible harmful ingredients in the foods you normally eat.  If you are not sure whether an ingredient contains gluten, check with the manufacturer. Note that some manufacturers may change ingredients without notice. Always read labels.  Since flour and cereal products are often used in the preparation of foods, it is important to be aware of the methods of preparation used, as well as the foods themselves. This is especially true when you are dining out. Starches  Allowed: Only those prepared from arrowroot, corn, potato, rice, and bean flours. Rice wafers(*), pure cornmeal tortillas, popcorn, some crackers, and chips(*). Hot cereals made from cornmeal. Ask your dietician which specific hot and cold cereals are allowed. White or sweet potatoes, yams, hominy, rice or wild rice, and special gluten-free pasta. Some oriental rice noodles or bean noodles.  Avoid: All wheat and rye cereals, wheat germ, barley, bran, graham, malt, bulgur, and millet(-). NOTE: Avoid cereals containing malt as a flavoring, such as rice cereal. Regular noodles, spaghetti, macaroni, and most packaged rice mixes(*). All others containing wheat, rye, or barley. Vegetables  Allowed: All plain, fresh, frozen, or canned vegetables.  Avoid: Creamed vegetables(*) and vegetables canned in sauces(*). Any prepared with wheat, rye, or barley. Fruit  Allowed: All fresh, frozen, canned, or dried fruits. Fruit juices.  Avoid:  Thickened or prepared fruits and some pie fillings(*). Meat and Meat Substitutes  Allowed: Meat, fish, poultry, or eggs prepared without added wheat, rye, or barley.  Luncheon meat(*), frankfurters(*), and pure meat. All aged cheese and processed cheese products(*). Cottage cheese(+) and cream cheese(+). Dried beans, dried peas, and lentils.  Avoid: Any meat or meat alternate containing wheat, rye, barley, or gluten stabilizers. Bread-containing products, such as Swiss steak, croquettes, and meatloaf. Tuna canned in vegetable broth(*); [place] with HVP injected as part of the basting; any cheese product containing oat gum as an ingredient. Milk  Allowed: Milk. Yogurt made with allowed ingredients(*).  Avoid: Commercial chocolate milk which may have cereal added(*). Malted milk. Soups and Combination Foods  Allowed: Homemade broth and soups made with allowed ingredients; some canned or frozen soups are allowed(*). Combination or prepared foods that do not contain gluten(*). Read labels.  Avoid: All soups containing wheat, rye, or barley flour. Bouillon and bouillon cubes that contain hydrolyzed vegetable protein (HVP). Combination or prepared foods that contain gluten(*). Desserts  Allowed:  Custard, some pudding mixes(*), homemade puddings from cornstarch, rice, and tapioca. Gelatin desserts, ices, and sherbet(*). Cake, cookies, and other desserts prepared with allowed flours. Some commercial ice creams(*). Ask your dietician about specific brands of dessert that are allowed.  Avoid: Cakes, cookies, doughnuts, and pastries that are prepared with wheat, rye, or barley flour. Some commercial ice creams(*), ice cream flavors which contain cookies, crumbs, or cheesecake(*). Ice cream cones. All commercially prepared mixes for cakes, cookies, and other desserts(*). Bread pudding and other puddings thickened with flour. Sweets  Allowed: Sugar, honey, syrup(*), molasses, jelly, jam, plain hard candy, marshmallows, gumdrops, homemade candies free from wheat, rye, or barley. Coconut.  Avoid: Commercial candies containing wheat, rye, or barley(*). Certain buttercrunch  toffees are dusted with wheat flour. Ask your dietician about specific brands that are not allowed. Chocolate-coated nuts, which are often rolled in flour. Fats and Oils  Allowed: Butter, margarine, vegetable oil, sour cream(+), whipping cream, shortening, lard, cream, mayonnaise(*). Some commercial salad dressings(*). Peanut butter.  Avoid: Some commercial salad dressings(*). Beverages  Allowed: Coffee (regular or decaffeinated), tea, herbal tea (read label to be sure that no wheat flour has been added). Carbonated beverages and some root beers(*). Wine, sake, and distilled spirits, such as gin, vodka, and whiskey.  Avoid:  Certain cereal beverages. Ask your dietician about specific brands that are not allowed. Beer (unless gluten-free), ale, malted milk, and some root beers, wine, and sake. Condiments/ Miscellaneous  Allowed: Salt, pepper, herbs, spices, extracts, and food colorings. Monosodium glutamate (MSG). Cider, rice, and wine vinegar. Baking soda and baking powder. Certain soy sauces. Ask your dietician about specific brands that are allowed. Nuts, coconut, chocolate, and pure cocoa powder.  Avoid: Some curry powder(*), some dry seasoning mixes(*), some gravy extracts(*), some meat sauces(*), some catsup(*), some prepared mustard(*), horseradish(*), some soy sauce(*), chip dips(*), and some chewing gum(*). Yeast extract (contains barley). Caramel color (may contain malt). Ask your dietician about specific brands of condiments to avoid. Flour and Thickening Agents  Allowed: Arrowroot starch (A); Corn bran (B); Corn flour (B,C,D); Corn germ (B); Cornmeal (B,C,D); Corn starch (A); Potato flour (B,C,E); Potato starch flour (B,C,E); Rice bran (B); Rice flours: Plain, brown (B,C,D,E), and Sweet (A,B,C,F). Rice polish (B,C,G); Soy flour (B,C,G); Tapioca starch (A). The flour and thickening agents described above are good for: (A) Good thickening agent (B) Good when combined with other  flours (C) Best combined with milk and eggs in  baked products (D) Best in grainy-textured products (E) Produces drier product than other flours (F) Produces moister product than other flours (G) Adds distinct flavor to product. Use in moderation. (*) Check labels and investigate any questionable ingredients.  (-) Additional research is needed before this product can be recommended. (+) Check vegetable gum used. SAMPLE MEAL PLAN Breakfast   Orange juice.  Banana.  Rice or corn cereal.  Toast (gluten-free bread).  Heart-healthy tub margarine.  Jam.  Milk.  Coffee or tea. Lunch  Chicken salad sandwich (with gluten-free bread and mayonnaise).  Sliced tomatoes.  Heart-healthy tub margarine.  Apple.  Milk.  Coffee or tea. Dinner  Boeingoast beef.  Baked potato.  Broccoli.  Lettuce salad with gluten-free dressing.  Gluten-free bread.  Custard.  Heart-healthy margarine.  Coffee or tea. These meal plans are provided as samples. Your daily meal plans will vary. Document Released: 12/11/2005 Document Revised: 06/11/2012 Document Reviewed: 01/21/2012 [facility] Patient Information 2014 [place], [place].

## 2014-02-23 NOTE — Progress Notes (Signed)
Subjective:     Patient ID: Annette Anderson, female   DOB: 1971/11/17, 43 y.o.   MRN: [id number]  HPI The patient is a 43 YO female who is new to the clinic. She was seen in the hospital for PID and treated with antibiotics. She had a small fluid collection around 1 ovary at that time and CT abdomen without acute findings otherwise. She started with the abdominal pain end of January and feels that it has not changed much since treatment. She was seen at Ob/GYN and had repeat ultrasound which did not show the fluid collection, ovarian cyst, fibroids, or other gynecologic reason for the pain. She has concomitantly had diarrhea which is watery the same period of time. She is still having that and uses loperamide with intermittent success. She is eating bland foods and goes about 2-5 times per day and sometimes has to get up at night to go. She was also started on thyroid medication in the hospital and had been on thyroid medication in the past prior to moving to [place]. She denies fevers or chills at home. She denies SOB or chest pain. She is still able to work through the pain although uses the percocet to help her get through. She is on her feet walking all day at work. No weight change during this time.   Family medical history reviewed and updated.  Past medical history reviewed and updated.  Problem list reviewed and updated.   Social history reviewed and updated.   Review of Systems  Constitutional: Negative for fever, chills, diaphoresis, activity change, appetite change, fatigue and unexpected weight change.  Respiratory: Negative for cough, chest tightness, shortness of breath and wheezing.   Cardiovascular: Negative for chest pain, palpitations and leg swelling.  Gastrointestinal: Positive for nausea, abdominal pain, diarrhea and abdominal distention. Negative for vomiting, constipation, blood in stool, anal bleeding and rectal pain.  Endocrine: Negative for cold intolerance, heat  intolerance, polydipsia, polyphagia and polyuria.  Genitourinary: Negative for dysuria, frequency, vaginal discharge, difficulty urinating and dyspareunia.       Objective:   Physical Exam  Constitutional: She is oriented to person, place, and time. She appears well-developed and well-nourished.  Obese  HENT:  Head: Normocephalic and atraumatic.  Eyes: EOM are normal. Pupils are equal, round, and reactive to light.  Neck: Neck supple. No thyromegaly present.  No thyroid nodules appreciated on exam.   Cardiovascular: Normal rate, regular rhythm and normal heart sounds.   Pulmonary/Chest: Effort normal and breath sounds normal. No respiratory distress. She has no wheezes. She has no rales. She exhibits no tenderness.  Abdominal: Soft. Bowel sounds are normal. She exhibits distension. She exhibits no mass. There is tenderness. There is no rebound and no guarding.  Distention versus adiposity, tender in the right lower quadrant and mild in the left lower quadrant.   Musculoskeletal: Normal range of motion. She exhibits no edema and no tenderness.  Neurological: She is alert and oriented to person, place, and time. No cranial nerve deficit. Coordination normal.  Skin: Skin is warm and dry.       Assessment:/Plan:   1. Please see problem oriented charting.  2. Disposition - Checking TSH and free T4 today. Will adjust thyroid medication as appropriate. Will advise gluten free diet and adding fiber. Refill loperamide and percocet. When her insurance card arrives will refer to GI.

## 2014-02-23 NOTE — Assessment & Plan Note (Signed)
Etiology not certain at this time. PID and fluid has resolved without resolution of symptoms. She does occasionally have to get up at night time to stool making IBS a less likely possibility although still in differential. She is more likely to have IBD given crampy pain and diarrhea. She will likely need colonoscopy. Advised gluten free diet for now as celiac is possibility at this time and she does not have insurance and would like to hold on testing at this time.   She will call when insurance and will place referral for GI. Refilled percocet 5/325 # 60 no refills, loperamide for diarrhea.   Advised gluten free diet and given info. Advised to increase fiber in diet.

## 2014-02-23 NOTE — Assessment & Plan Note (Addendum)
Per patient was previously controlled on 88 mcg synthroid daily. She was started on 150 mcg daily in hospital about 1.5 months ago. Check TSH and free T4 today. Will adjust medication as appropriate. No nodules felt on exam. Sister with history of radioactive iodine ablation and brother with unknown thyroid troubles. If dosing too high could be contributing to diarrheal symptoms.

## 2014-02-24 LAB — T4, FREE: Free T4: 1.77 ng/dL (ref 0.80–1.80)

## 2014-02-24 LAB — TSH: TSH: 0.13 u[IU]/mL — ABNORMAL LOW (ref 0.350–4.500)

## 2014-02-24 NOTE — Progress Notes (Signed)
Case discussed with Dr. Dorise HissKollar soon after the resident saw the patient.  We reviewed the resident's history and exam and pertinent patient test results.  Her TSH was low so we will decrease the dose of her synthroid.  I agree with the assessment, diagnosis, and plan of care documented in the resident's note.

## 2014-02-25 MED ORDER — LEVOTHYROXINE SODIUM 100 MCG PO TABS
100.0000 ug | ORAL_TABLET | Freq: Every day | ORAL | Status: AC
Start: 2014-02-25 — End: 2015-02-25

## 2014-02-25 NOTE — Addendum Note (Signed)
Addended by: Genella MechKOLLAR, Cypress Fanfan A on: 02/25/2014 10:28 AM   Modules accepted: Orders, Medications

## 2014-03-20 ENCOUNTER — Encounter: Payer: Self-pay | Admitting: Internal Medicine

## 2014-03-20 ENCOUNTER — Ambulatory Visit (INDEPENDENT_AMBULATORY_CARE_PROVIDER_SITE_OTHER): Payer: BC Managed Care – PPO | Admitting: Internal Medicine

## 2014-03-20 VITALS — BP 150/95 | HR 78 | Temp 97.8°F | Ht 62.0 in | Wt 175.6 lb

## 2014-03-20 DIAGNOSIS — E039 Hypothyroidism, unspecified: Secondary | ICD-10-CM

## 2014-03-20 DIAGNOSIS — R109 Unspecified abdominal pain: Secondary | ICD-10-CM

## 2014-03-20 LAB — POCT URINALYSIS DIPSTICK
BILIRUBIN UA: NEGATIVE
GLUCOSE UA: NEGATIVE
KETONES UA: NEGATIVE
Leukocytes, UA: NEGATIVE
Nitrite, UA: NEGATIVE
Protein, UA: NEGATIVE
RBC UA: NEGATIVE
Urobilinogen, UA: 0.2
pH, UA: 7

## 2014-03-20 LAB — POCT URINE PREGNANCY: PREG TEST UR: NEGATIVE

## 2014-03-20 MED ORDER — OXYCODONE-ACETAMINOPHEN 5-325 MG PO TABS: 1.0000 | ORAL_TABLET | Freq: Four times a day (QID) | ORAL | Status: DC | PRN

## 2014-03-20 MED ORDER — IBUPROFEN 800 MG PO TABS: 800.0000 mg | ORAL_TABLET | Freq: Three times a day (TID) | ORAL | Status: DC

## 2014-03-20 NOTE — Progress Notes (Signed)
Subjective:     Patient ID: Annette Anderson, female   DOB: [birth year], 43 y.o.   MRN: [id number]  HPI The patient is a 43 YO female who is returning to our clinic with abdominal pain and possible UTI. She was recently established and treated for PID. She has history of thyroid problems and started on aggressive replacement during the hospital. Her levels were checked at last visit and dose of synthroid reduced due to high levels. She denies fevers or chills at home. She denies SOB or chest pain. She was able to make the change to the lower dose of thyroid medicine. She states that she was doing well for 1-2 weeks and now the last 3 days is having some lower abdominal pain. She is having some nausea. She took ibuprofen yesterday which helped some and today has not taken it. She states she doesn't feel she has enough energy. She is not having diarrhea at all anymore and is not having constipation. She has gotten metamucil and is doing well. She is wanting more pain medicine and states that she has been out of percocet for 1-2 weeks although she did not take that much of it. She states she did as I advised and alternated the ibuprofen with the percocet.   Review of Systems  Constitutional: Negative for fever, chills, diaphoresis, activity change, appetite change, fatigue and unexpected weight change.  Respiratory: Negative for cough, chest tightness, shortness of breath and wheezing.   Cardiovascular: Negative for chest pain, palpitations and leg swelling.  Gastrointestinal: Positive for nausea, abdominal pain, diarrhea and abdominal distention. Negative for vomiting, constipation, blood in stool, anal bleeding and rectal pain.  Endocrine: Negative for cold intolerance, heat intolerance, polydipsia, polyphagia and polyuria.  Genitourinary: Negative for dysuria, frequency, vaginal discharge, difficulty urinating and dyspareunia.       Objective:   Physical Exam  Constitutional: She is oriented to person,  place, and time. She appears well-developed and well-nourished.  Obese  HENT:  Head: Normocephalic and atraumatic.  Eyes: EOM are normal. Pupils are equal, round, and reactive to light.  Neck: Neck supple. No thyromegaly present.  No thyroid nodules appreciated on exam.   Cardiovascular: Normal rate, regular rhythm and normal heart sounds.   Pulmonary/Chest: Effort normal and breath sounds normal. No respiratory distress. She has no wheezes. She has no rales. She exhibits no tenderness.  Abdominal: Soft. Bowel sounds are normal. She exhibits distension. She exhibits no mass. There is tenderness. There is no rebound and no guarding.  Distention versus adiposity, tender in the right lower quadrant and mild in the left lower quadrant.   Musculoskeletal: Normal range of motion. She exhibits no edema and no tenderness.  Neurological: She is alert and oriented to person, place, and time. No cranial nerve deficit. Coordination normal.  Skin: Skin is warm and dry.       Assessment:/Plan:   1. Please see problem oriented charting.  2. Disposition - Check U/A today, prescription abuse monitoring, pregnancy test. She will take ibuprofen for her presumed gyn pain. She was insisting that she is taking that and she needs something else. Discuss her recent percocet prescription which she states she did not take much but is gone. She insists that she will go straight to the ED if no pain control given. Agreed to give her percocet 5/325 mg #20 no refills and advised that this is not something we can do for her long term. This is a warning sign for narcotic seeking behavior. No  signs of infection or kidney stone on exam. Advised her that we will recheck the thyroid level in about another 4-6 weeks since dosage was just changed. She is not having signs of clinical hypothyroidism.

## 2014-03-20 NOTE — Assessment & Plan Note (Signed)
Dose decreased to 100 mcg after last visit and diarrhea has resolved indicating that she was likely over replaced. She will maintain that dose until next visit when TSH should be rechecked. Aim for goal TSH around 2.

## 2014-03-20 NOTE — Assessment & Plan Note (Addendum)
She is having some abdominal pain which sounds related to menses and she is in perimenopausal state. Would recommend she follow up with gyn. Her diarrhea seems to have resolved with gluten free diet and adjustment of thyroid medicine. Will still recommend that she see GI for colonoscopy once she has insurance coverage. Given naproxen for pain given likely gyn etiology. Will refill percocet 5/325 # 20 no refills and would label her with narcotic seeking behavior given holes in her story about how much she was taking and when. Prescription abuse monitoring done today with last reported dose 1-2 weeks ago. No signs of infection or Hg in urine making UTI or kidney stone much less likely.

## 2014-03-20 NOTE — Patient Instructions (Addendum)
We will have you take ibuprofen at the prescription dose which is a strong anti-inflammatory medicine for your pain. Take 1 pill 3 times per day for 1 week. This should help with the pain. Make sure to take it with food to help it from irritating your stomach.   We do not think you have a kidney infection or kidney stone.    Come back as previously scheduled or if the pain does not go away in 1-2 weeks.   Call us with problems or questions at [phone].  We do not want to adjust your thyroid medicine until we recheck the levels and it is too soon to check the levels until you return.   Ovarian Cyst An ovarian cyst is a sac filled with fluid or blood. This sac is attached to the ovary. Some cysts go away on their own. Other cysts need treatment.  HOME CARE   Only take medicine as told by your doctor.  Follow up with your doctor as told.  Get regular pelvic exams and Pap tests. GET HELP IF:  Your periods are late, not regular, or painful.  You stop having periods.  Your belly (abdominal) or pelvic pain does not go away.  Your belly becomes large or puffy (swollen).  You have a hard time peeing (totally emptying your bladder).  You have pressure on your bladder.  You have pain during sex.  You feel fullness, pressure, or discomfort in your belly.  You lose weight for no reason.  You feel sick most of the time.  You have a hard time pooping (constipation).  You do not feel like eating.  You develop pimples (acne).  You have an increase in hair on your body and face.  You are gaining weight for no reason.  You think you are pregnant. GET HELP RIGHT AWAY IF:   Your belly pain gets worse.  You feel sick to your stomach (nauseous), and you throw up (vomit).  You have a fever that comes on fast.  You have belly pain while pooping (bowel movement).  Your periods are heavier than usual. MAKE SURE YOU:   Understand these instructions.  Will watch your  condition.  Will get help right away if you are not doing well or get worse. Document Released: 05/29/2008 Document Revised: 10/01/2013 Document Reviewed: 08/18/2013 [employer] Patient Information 2014 [employer] [place].

## 2014-03-21 LAB — PRESCRIPTION ABUSE MONITORING 15P, URINE
Amphetamine/Meth: NEGATIVE ng/mL
BARBITURATE SCREEN, URINE: NEGATIVE ng/mL
Buprenorphine, Urine: NEGATIVE ng/mL
CANNABINOID SCRN UR: NEGATIVE ng/mL
CARISOPRODOL, URINE: NEGATIVE ng/mL
COCAINE METABOLITES: NEGATIVE ng/mL
CREATININE, URINE: 213.24 mg/dL (ref >20.0)
FENTANYL URINE: NEGATIVE ng/mL
MEPERIDINE UR: NEGATIVE ng/mL
Methadone Screen, Urine: NEGATIVE ng/mL
Opiate Screen, Urine: NEGATIVE ng/mL
Oxycodone Screen, Ur: NEGATIVE ng/mL
Propoxyphene: NEGATIVE ng/mL
Tramadol Scrn, Ur: NEGATIVE ng/mL
ZOLPIDEM, URINE: NEGATIVE ng/mL

## 2014-03-23 ENCOUNTER — Ambulatory Visit: Payer: Self-pay

## 2014-03-23 ENCOUNTER — Encounter (HOSPITAL_COMMUNITY): Payer: Self-pay | Admitting: Emergency Medicine

## 2014-03-23 ENCOUNTER — Emergency Department (HOSPITAL_COMMUNITY)
Admission: EM | Admit: 2014-03-23 | Discharge: 2014-03-23 | Disposition: A | Discharge status: Home / Self Care | Payer: BC Managed Care – PPO | Attending: Emergency Medicine | Admitting: Emergency Medicine

## 2014-03-23 ENCOUNTER — Emergency Department (HOSPITAL_COMMUNITY): Payer: BC Managed Care – PPO

## 2014-03-23 DIAGNOSIS — E079 Disorder of thyroid, unspecified: Secondary | ICD-10-CM | POA: Insufficient documentation

## 2014-03-23 DIAGNOSIS — J111 Influenza due to unidentified influenza virus with other respiratory manifestations: Secondary | ICD-10-CM

## 2014-03-23 DIAGNOSIS — Z8719 Personal history of other diseases of the digestive system: Secondary | ICD-10-CM | POA: Insufficient documentation

## 2014-03-23 DIAGNOSIS — R000 Tachycardia, unspecified: Secondary | ICD-10-CM | POA: Insufficient documentation

## 2014-03-23 DIAGNOSIS — F17200 Nicotine dependence, unspecified, uncomplicated: Secondary | ICD-10-CM | POA: Insufficient documentation

## 2014-03-23 DIAGNOSIS — Z3202 Encounter for pregnancy test, result negative: Secondary | ICD-10-CM | POA: Insufficient documentation

## 2014-03-23 DIAGNOSIS — R0602 Shortness of breath: Secondary | ICD-10-CM | POA: Insufficient documentation

## 2014-03-23 DIAGNOSIS — R062 Wheezing: Secondary | ICD-10-CM | POA: Insufficient documentation

## 2014-03-23 DIAGNOSIS — Z791 Long term (current) use of non-steroidal anti-inflammatories (NSAID): Secondary | ICD-10-CM | POA: Insufficient documentation

## 2014-03-23 DIAGNOSIS — Z79899 Other long term (current) drug therapy: Secondary | ICD-10-CM | POA: Insufficient documentation

## 2014-03-23 LAB — POC URINE PREG, ED: PREG TEST UR: NEGATIVE

## 2014-03-23 MED ORDER — ACETAMINOPHEN 325 MG PO TABS
650.0000 mg | ORAL_TABLET | Freq: Once | ORAL | Status: AC
  Administered 2014-03-23: 650 mg via ORAL
  Filled 2014-03-23: qty 2

## 2014-03-23 MED ORDER — PREDNISONE 20 MG PO TABS
40.0000 mg | ORAL_TABLET | Freq: Once | ORAL | Status: AC
  Administered 2014-03-23: 40 mg via ORAL
  Filled 2014-03-23: qty 2

## 2014-03-23 MED ORDER — IPRATROPIUM-ALBUTEROL 0.5-2.5 (3) MG/3ML IN SOLN
5.0000 mL | Freq: Once | RESPIRATORY_TRACT | Status: AC
  Administered 2014-03-23: 5 mL via RESPIRATORY_TRACT
  Filled 2014-03-23: qty 6

## 2014-03-23 MED ORDER — KETOROLAC TROMETHAMINE 30 MG/ML IJ SOLN: 30.0000 mg | Freq: Once | INTRAMUSCULAR | Status: DC

## 2014-03-23 MED ORDER — ALBUTEROL SULFATE HFA 108 (90 BASE) MCG/ACT IN AERS
1.0000 | INHALATION_SPRAY | Freq: Four times a day (QID) | RESPIRATORY_TRACT | Status: DC | PRN
Start: 2014-03-23 — End: 2017-12-18

## 2014-03-23 MED ORDER — IBUPROFEN 800 MG PO TABS: 800.0000 mg | ORAL_TABLET | Freq: Three times a day (TID) | ORAL | Status: DC

## 2014-03-23 MED ORDER — BENZONATATE 100 MG PO CAPS: 100.0000 mg | ORAL_CAPSULE | Freq: Three times a day (TID) | ORAL | Status: DC

## 2014-03-23 MED ORDER — OSELTAMIVIR PHOSPHATE 75 MG PO CAPS: 75.0000 mg | ORAL_CAPSULE | Freq: Two times a day (BID) | ORAL | Status: DC

## 2014-03-23 MED ORDER — ACETAMINOPHEN 325 MG PO TABS: 650.0000 mg | ORAL_TABLET | Freq: Once | ORAL | Status: DC

## 2014-03-23 MED ORDER — ALBUTEROL (5 MG/ML) CONTINUOUS INHALATION SOLN: 10.0000 mg/h | INHALATION_SOLUTION | RESPIRATORY_TRACT | Status: DC

## 2014-03-23 MED ORDER — IPRATROPIUM-ALBUTEROL 0.5-2.5 (3) MG/3ML IN SOLN
5.0000 mL | Freq: Once | RESPIRATORY_TRACT | Status: AC
  Administered 2014-03-23: 5 mL via RESPIRATORY_TRACT
  Filled 2014-03-23: qty 3

## 2014-03-23 NOTE — Progress Notes (Signed)
Case discussed with Dr. Kollar soon after the resident saw the patient.  We reviewed the resident's history and exam and pertinent patient test results.  I agree with the assessment, diagnosis, and plan of care documented in the resident's note. 

## 2014-03-23 NOTE — Discharge Instructions (Signed)
Call for a follow up appointment with a Family or Primary Care Provider.  °Return if Symptoms worsen.   °Take medication as prescribed.  °Drink plenty of fluids. °

## 2014-03-23 NOTE — ED Provider Notes (Signed)
CSN: [id number]     Arrival date & time 03/23/14  1122 History  This chart was scribed for non-physician practitioner, Mellody Drown, PA-C working with Audree Camel, MD by Greggory Stallion, ED scribe. This patient was seen in room TR09C/TR09C and the patient's care was started at 3:41 PM.   Chief Complaint  Patient presents with  . Generalized Body Aches   HPI Comments: Annette Anderson is a 43 y.o. female who presents to the Emergency Department complaining of generalized body aches, congestion, cough and intermittent low grade fever that started 3 days ago.  She reports wheezing.  Pt has taken alka seltzer and robitussin with no relief. Pt smokes one pack of cigarettes every 3 weeks.  No PCP  The history is provided by the patient. No language interpreter was used.    Past Medical History  Diagnosis Date  . Thyroid disease   . Achalasia    Past Surgical History  Procedure Laterality Date  . Cesarean section     Family History  Problem Relation Age of Onset  . Hypertension Mother   . Hypertension Father   . Diabetes Father   . Thyroid disease Sister     radioactive ablation   History  Substance Use Topics  . Smoking status: Current Every Day Smoker -- 0.10 packs/day    Types: Cigarettes  . Smokeless tobacco: Never Used  . Alcohol Use: Yes   OB History   Grav Para Term Preterm Abortions TAB SAB Ect Mult Living   3 3 3  0 0 0 0 0 0 3     Review of Systems  Constitutional: Positive for fever and chills.  HENT: Positive for congestion.   Respiratory: Positive for cough, shortness of breath and wheezing.   Musculoskeletal: Positive for myalgias.  Skin: Negative for rash.  All other systems reviewed and are negative.   Allergies  Bee venom; Sulfa antibiotics; and Naproxen  Home Medications   Current Outpatient Rx  Name  Route  Sig  Dispense  Refill  . calcium carbonate (TUMS EX) 750 MG chewable tablet   Oral   Chew 2 tablets by mouth daily as needed for  heartburn.         . Cyanocobalamin (VITAMIN B 12 PO)   Oral   Take 1 tablet by mouth daily.         Marland Kitchen ibuprofen (ADVIL,MOTRIN) 800 MG tablet   Oral   Take 1 tablet (800 mg total) by mouth 3 (three) times daily.   45 tablet   1   . levothyroxine (SYNTHROID) 100 MCG tablet   Oral   Take 1 tablet (100 mcg total) by mouth daily.   30 tablet   3   . loperamide (IMODIUM) 2 MG capsule   Oral   Take 1 capsule (2 mg total) by mouth as needed for diarrhea or loose stools (up to 8 pills per day).   60 capsule   2   . oxyCODONE-acetaminophen (PERCOCET/ROXICET) 5-325 MG per tablet   Oral   Take 1 tablet by mouth every 6 (six) hours as needed for severe pain.   20 tablet   0   . promethazine (PHENERGAN) 12.5 MG tablet   Oral   Take 12.5 mg by mouth every 6 (six) hours as needed for nausea or vomiting.          BP 152/89  Pulse 107  Temp(Src) 99.7 F (37.6 C) (Oral)  Resp 18  Wt 174 lb 12.8 oz (79.289  kg)  SpO2 97%  LMP 12/11/2013  Physical Exam  Nursing note and vitals reviewed. Constitutional: She is oriented to person, place, and time. She appears well-developed and well-nourished. No distress.  HENT:  Head: Normocephalic and atraumatic.  Right Ear: Tympanic membrane and ear canal normal.  Left Ear: Tympanic membrane and ear canal normal.  Nose: Nose normal.  Mouth/Throat: Uvula is midline and oropharynx is clear and moist. No oropharyngeal exudate, posterior oropharyngeal edema or posterior oropharyngeal erythema.  Eyes: EOM are normal.  Neck: Neck supple.  Cardiovascular: Regular rhythm and normal heart sounds.  Tachycardia present.   Pulmonary/Chest: Effort normal. No respiratory distress. She has wheezes in the right upper field and the left lower field. She has no rhonchi. She has no rales.  Abdominal: Soft. There is no tenderness. There is no rebound.  Musculoskeletal: Normal range of motion.  Lymphadenopathy:       Head (right side): No submental, no  submandibular, no tonsillar, no preauricular, no posterior auricular and no occipital adenopathy present.       Head (left side): No submental, no submandibular, no tonsillar, no preauricular, no posterior auricular and no occipital adenopathy present.    She has no cervical adenopathy.  Neurological: She is alert and oriented to person, place, and time.  Skin: Skin is warm and dry.  Psychiatric: She has a normal mood and affect. Her behavior is normal.    ED Course  Procedures (including critical care time)  COORDINATION OF CARE: 3:45 PM-Discussed treatment plan which includes a breathing treatment with pt at bedside and pt agreed to plan.   Labs Review Labs Reviewed  POC URINE PREG, ED   Imaging Review Dg Chest 2 View  03/23/2014   CLINICAL DATA:  Chest pain  EXAM: CHEST  2 VIEW  COMPARISON:  12/25/2013  FINDINGS: Cardiac shadow is stable. The lungs are well aerated bilaterally. No focal infiltrate or sizable effusion is seen. No acute bony abnormality is noted.  IMPRESSION: No active cardiopulmonary disease.   Electronically Signed   By: Alcide Clever M.D.   On: 03/23/2014 11:56     EKG Interpretation None      MDM   Final diagnoses:  Influenza  Wheezing   Pt with several day history of generalized body aches and low grade fever 100.7.  Wheezing on exam. Duo neb.  Re-eval: Pt with persistent wheezing on exam. Duo-neb x2 ordered. Re-eval: Pt with mild expiratory wheezing on exam.  Discussed with Dr. Gwendolyn Grant, agrees to evaluate the patient.  States the patient can be further evaluated as an out-pt. Discussed lab results, imaging results, and treatment plan with the patient. Return precautions given. Reports understanding and no other concerns at this time.  Patient is stable for discharge at this time.  Meds given in ED:  Medications  acetaminophen (TYLENOL) tablet 650 mg (650 mg Oral Given 03/23/14 1422)  ipratropium-albuterol (DUONEB) 0.5-2.5 (3) MG/3ML nebulizer solution  5 mL (5 mLs Nebulization Given 03/23/14 1555)  ipratropium-albuterol (DUONEB) 0.5-2.5 (3) MG/3ML nebulizer solution 5 mL (5 mLs Nebulization Given 03/23/14 1653)  predniSONE (DELTASONE) tablet 40 mg (40 mg Oral Given 03/23/14 1653)    New Prescriptions   ALBUTEROL (PROVENTIL HFA;VENTOLIN HFA) 108 (90 BASE) MCG/ACT INHALER    Inhale 1-2 puffs into the lungs every 6 (six) hours as needed for wheezing or shortness of breath.   BENZONATATE (TESSALON) 100 MG CAPSULE    Take 1 capsule (100 mg total) by mouth every 8 (eight) hours.  IBUPROFEN (ADVIL,MOTRIN) 800 MG TABLET    Take 1 tablet (800 mg total) by mouth 3 (three) times daily. Take with meals   OSELTAMIVIR (TAMIFLU) 75 MG CAPSULE    Take 1 capsule (75 mg total) by mouth every 12 (twelve) hours.    I personally performed the services described in this documentation, which was scribed in my presence. The recorded information has been reviewed and is accurate.  Clabe SealLauren M Shelia Magallon, PA-C 03/23/14 [id number]

## 2014-03-23 NOTE — ED Notes (Signed)
Pt reports feeling bad for 3 days; talking OTC cold meds. Nasal congestion, cough, low grade fevers.

## 2014-03-23 NOTE — ED Provider Notes (Signed)
Medical screening examination/treatment/procedure(s) were conducted as a shared visit with non-physician practitioner(s) and myself.  I personally evaluated the patient during the encounter.   EKG Interpretation None      Patient here with myalgias, fevers, SOB, cough, wheezing. All began a few days ago. Concern for flu-like illness. After 2 duonebs, wheezing almost completely resolved. Will treat with tamiflu, supportive care.   Dagmar HaitWilliam Nidal Rivet, MD 03/23/14 [phone]

## 2014-03-24 LAB — BENZODIAZEPINES (GC/LC/MS), URINE
Alprazolam metabolite (GC/LC/MS), ur confirm: NEGATIVE ng/mL
Clonazepam metabolite (GC/LC/MS), ur confirm: NEGATIVE ng/mL
Diazepam (GC/LC/MS), ur confirm: NEGATIVE ng/mL
ESTAZOLAMU: NEGATIVE ng/mL
FLURAZEPAMU: NEGATIVE ng/mL
Flunitrazepam metabolite (GC/LC/MS), ur confirm: NEGATIVE ng/mL
HYDROXYALPRAZ UR: NEGATIVE ng/mL
Lorazepam (GC/LC/MS), ur confirm: NEGATIVE ng/mL
Midazolam (GC/LC/MS), ur confirm: NEGATIVE ng/mL
Nordiazepam (GC/LC/MS), ur confirm: NEGATIVE ng/mL
Oxazepam (GC/LC/MS), ur confirm: 124 ng/mL
TEMAZEPAMU: NEGATIVE ng/mL
Triazolam metabolite (GC/LC/MS), ur confirm: NEGATIVE ng/mL

## 2014-04-14 ENCOUNTER — Emergency Department (HOSPITAL_COMMUNITY)
Admission: EM | Admit: 2014-04-14 | Discharge: 2014-04-14 | Disposition: A | Discharge status: Home / Self Care | Payer: BC Managed Care – PPO | Attending: Emergency Medicine | Admitting: Emergency Medicine

## 2014-04-14 ENCOUNTER — Emergency Department (HOSPITAL_COMMUNITY): Payer: BC Managed Care – PPO

## 2014-04-14 ENCOUNTER — Encounter (HOSPITAL_COMMUNITY): Payer: Self-pay | Admitting: Emergency Medicine

## 2014-04-14 DIAGNOSIS — N938 Other specified abnormal uterine and vaginal bleeding: Secondary | ICD-10-CM | POA: Insufficient documentation

## 2014-04-14 DIAGNOSIS — R110 Nausea: Secondary | ICD-10-CM | POA: Insufficient documentation

## 2014-04-14 DIAGNOSIS — Z3202 Encounter for pregnancy test, result negative: Secondary | ICD-10-CM | POA: Insufficient documentation

## 2014-04-14 DIAGNOSIS — F17200 Nicotine dependence, unspecified, uncomplicated: Secondary | ICD-10-CM | POA: Insufficient documentation

## 2014-04-14 DIAGNOSIS — Z79899 Other long term (current) drug therapy: Secondary | ICD-10-CM | POA: Insufficient documentation

## 2014-04-14 DIAGNOSIS — N949 Unspecified condition associated with female genital organs and menstrual cycle: Secondary | ICD-10-CM | POA: Insufficient documentation

## 2014-04-14 DIAGNOSIS — R102 Pelvic and perineal pain: Secondary | ICD-10-CM

## 2014-04-14 DIAGNOSIS — Z8719 Personal history of other diseases of the digestive system: Secondary | ICD-10-CM | POA: Insufficient documentation

## 2014-04-14 DIAGNOSIS — E079 Disorder of thyroid, unspecified: Secondary | ICD-10-CM | POA: Insufficient documentation

## 2014-04-14 LAB — URINALYSIS, ROUTINE W REFLEX MICROSCOPIC
Bilirubin Urine: NEGATIVE
GLUCOSE, UA: NEGATIVE mg/dL
HGB URINE DIPSTICK: NEGATIVE
Ketones, ur: NEGATIVE mg/dL
Leukocytes, UA: NEGATIVE
Nitrite: NEGATIVE
PROTEIN: NEGATIVE mg/dL
Specific Gravity, Urine: 1.026 (ref 1.005–1.030)
Urobilinogen, UA: 0.2 mg/dL (ref 0.0–1.0)
pH: 5.5 (ref 5.0–8.0)

## 2014-04-14 LAB — WET PREP, GENITAL
Trich, Wet Prep: NONE SEEN
Yeast Wet Prep HPF POC: NONE SEEN

## 2014-04-14 LAB — CBC WITH DIFFERENTIAL/PLATELET
Basophils Absolute: 0 10*3/uL (ref 0.0–0.1)
Basophils Relative: 1 % (ref 0–1)
Eosinophils Absolute: 0.2 10*3/uL (ref 0.0–0.7)
Eosinophils Relative: 3 % (ref 0–5)
HEMATOCRIT: 33.1 % — AB (ref 36.0–46.0)
HEMOGLOBIN: 10.6 g/dL — AB (ref 12.0–15.0)
LYMPHS ABS: 1.6 10*3/uL (ref 0.7–4.0)
Lymphocytes Relative: 26 % (ref 12–46)
MCH: 25.1 pg — ABNORMAL LOW (ref 26.0–34.0)
MCHC: 32 g/dL (ref 30.0–36.0)
MCV: 78.4 fL (ref 78.0–100.0)
MONO ABS: 0.4 10*3/uL (ref 0.1–1.0)
MONOS PCT: 6 % (ref 3–12)
Neutro Abs: 3.9 10*3/uL (ref 1.7–7.7)
Neutrophils Relative %: 64 % (ref 43–77)
Platelets: 306 10*3/uL (ref 150–400)
RBC: 4.22 MIL/uL (ref 3.87–5.11)
RDW: 16.8 % — ABNORMAL HIGH (ref 11.5–15.5)
WBC: 6.1 10*3/uL (ref 4.0–10.5)

## 2014-04-14 LAB — PREGNANCY, URINE: PREG TEST UR: NEGATIVE

## 2014-04-14 MED ORDER — OXYCODONE-ACETAMINOPHEN 5-325 MG PO TABS
2.0000 | ORAL_TABLET | ORAL | Status: DC | PRN
Start: 2014-04-14 — End: 2014-04-22

## 2014-04-14 MED ORDER — MORPHINE SULFATE 4 MG/ML IJ SOLN
4.0000 mg | Freq: Once | INTRAMUSCULAR | Status: AC
  Administered 2014-04-14: 4 mg via INTRAMUSCULAR
  Filled 2014-04-14: qty 1

## 2014-04-14 MED ORDER — ONDANSETRON 4 MG PO TBDP
4.0000 mg | ORAL_TABLET | Freq: Once | ORAL | Status: AC
  Administered 2014-04-14: 4 mg via ORAL
  Filled 2014-04-14: qty 1

## 2014-04-14 MED ORDER — HYDROMORPHONE HCL PF 1 MG/ML IJ SOLN
1.0000 mg | Freq: Once | INTRAMUSCULAR | Status: AC
  Administered 2014-04-14: 1 mg via INTRAMUSCULAR
  Filled 2014-04-14: qty 1

## 2014-04-14 NOTE — ED Notes (Signed)
Pt c/o lower abd pain x 1 day, c/o nausea also. Last BM this morning.

## 2014-04-14 NOTE — Discharge Instructions (Signed)

## 2014-04-14 NOTE — Progress Notes (Signed)
pt [facility] last yr )

## 2014-04-14 NOTE — ED Notes (Signed)
Patient transported to Ultrasound 

## 2014-04-14 NOTE — ED Provider Notes (Signed)
CSN: [id number]     Arrival date & time 04/14/14  4098 History   First MD Initiated Contact with Patient 04/14/14 [phone]     Chief Complaint  Patient presents with  . Abdominal Pain     (Consider location/radiation/quality/duration/timing/severity/associated sxs/prior Treatment) HPI Comments: Patient presents with lower abdominal pain. She states she's had intermittent and crampy pain across her lower abdomen for the last several months. She states it'll go away and then come back again at certain times. She hasn't had a period since December. Yesterday she started bleeding again heavily. She also had a worsening pain across her lower abdomen, more on the right side. She has crampy pain across her lower back. She's had some nausea but no vomiting. She denies any fevers or chills. She denies any urinary symptoms. She denies any past history of pelvic issues. She is followed at the [facility] outpatient center. She's been using over-the-counter medicines without relief.  Patient is a 43 y.o. female presenting with abdominal pain.  Abdominal Pain Associated symptoms: nausea and vaginal bleeding   Associated symptoms: no chest pain, no chills, no cough, no diarrhea, no fatigue, no fever, no hematuria, no shortness of breath, no vaginal discharge and no vomiting     Past Medical History  Diagnosis Date  . Thyroid disease   . Achalasia    Past Surgical History  Procedure Laterality Date  . Cesarean section     Family History  Problem Relation Age of Onset  . Hypertension Mother   . Hypertension Father   . Diabetes Father   . Thyroid disease Sister     radioactive ablation   History  Substance Use Topics  . Smoking status: Current Some Day Smoker -- 0.10 packs/day    Types: Cigarettes  . Smokeless tobacco: Never Used  . Alcohol Use: Yes   OB History   Grav Para Term Preterm Abortions TAB SAB Ect Mult Living   3 3 3  0 0 0 0 0 0 3     Review of Systems  Constitutional: Negative for  fever, chills, diaphoresis and fatigue.  HENT: Negative for congestion, rhinorrhea and sneezing.   Eyes: Negative.   Respiratory: Negative for cough, chest tightness and shortness of breath.   Cardiovascular: Negative for chest pain and leg swelling.  Gastrointestinal: Positive for nausea and abdominal pain. Negative for vomiting, diarrhea and blood in stool.  Genitourinary: Positive for vaginal bleeding, menstrual problem and pelvic pain. Negative for frequency, hematuria, flank pain, vaginal discharge and difficulty urinating.  Musculoskeletal: Negative for arthralgias and back pain.  Skin: Negative for rash.  Neurological: Negative for dizziness, speech difficulty, weakness, numbness and headaches.      Allergies  Bee venom; Sulfa antibiotics; and Naproxen  Home Medications   Prior to Admission medications   Medication Sig Start Date End Date Taking? Authorizing Provider  albuterol (PROVENTIL HFA;VENTOLIN HFA) 108 (90 BASE) MCG/ACT inhaler Inhale 1-2 puffs into the lungs every 6 (six) hours as needed for wheezing or shortness of breath. 03/23/14  Yes Lauren Doretha Imus, PA-C  calcium carbonate (TUMS EX) 750 MG chewable tablet Chew 2 tablets by mouth daily as needed for heartburn.   Yes Historical Provider, MD  ibuprofen (ADVIL,MOTRIN) 200 MG tablet Take 800 mg by mouth every 6 (six) hours as needed for moderate pain.   Yes Historical Provider, MD  levothyroxine (SYNTHROID) 100 MCG tablet Take 1 tablet (100 mcg total) by mouth daily. 02/25/14 02/25/15 Yes Judie Bonus, MD  promethazine (PHENERGAN) 12.5  MG tablet Take 12.5 mg by mouth every 6 (six) hours as needed for nausea or vomiting.   Yes Historical Provider, MD   BP 157/90  Pulse 69  Temp(Src) 98.4 F (36.9 C) (Oral)  Resp 18  SpO2 96%  LMP 12/11/2013 Physical Exam  Constitutional: She is oriented to person, place, and time. She appears well-developed and well-nourished.  HENT:  Head: Normocephalic and atraumatic.  Eyes:  Pupils are equal, round, and reactive to light.  Neck: Normal range of motion. Neck supple.  Cardiovascular: Normal rate, regular rhythm and normal heart sounds.   Pulmonary/Chest: Effort normal and breath sounds normal. No respiratory distress. She has no wheezes. She has no rales. She exhibits no tenderness.  Abdominal: Soft. Bowel sounds are normal. There is tenderness (+TTP across lower abdomen, suprapubic area). There is no rebound and no guarding.  No CVA tenderness  Genitourinary:  Moderate dark blood in vault, no ongoing bleeding.  +midline TTP on bimanual, no CMT.  No discharge  Musculoskeletal: Normal range of motion. She exhibits no edema.  Lymphadenopathy:    She has no cervical adenopathy.  Neurological: She is alert and oriented to person, place, and time.  Skin: Skin is warm and dry. No rash noted.  Psychiatric: She has a normal mood and affect.    ED Course  Procedures (including critical care time) Labs Review Results for orders placed during the hospital encounter of 04/14/14  WET PREP, GENITAL      Result Value Ref Range   Yeast Wet Prep HPF POC NONE SEEN  NONE SEEN   Trich, Wet Prep NONE SEEN  NONE SEEN   Clue Cells Wet Prep HPF POC FEW (*) NONE SEEN   WBC, Wet Prep HPF POC FEW (*) NONE SEEN  URINALYSIS, ROUTINE W REFLEX MICROSCOPIC      Result Value Ref Range   Color, Urine YELLOW  YELLOW   APPearance CLOUDY (*) CLEAR   Specific Gravity, Urine 1.026  1.005 - 1.030   pH 5.5  5.0 - 8.0   Glucose, UA NEGATIVE  NEGATIVE mg/dL   Hgb urine dipstick NEGATIVE  NEGATIVE   Bilirubin Urine NEGATIVE  NEGATIVE   Ketones, ur NEGATIVE  NEGATIVE mg/dL   Protein, ur NEGATIVE  NEGATIVE mg/dL   Urobilinogen, UA 0.2  0.0 - 1.0 mg/dL   Nitrite NEGATIVE  NEGATIVE   Leukocytes, UA NEGATIVE  NEGATIVE  PREGNANCY, URINE      Result Value Ref Range   Preg Test, Ur NEGATIVE  NEGATIVE  CBC WITH DIFFERENTIAL      Result Value Ref Range   WBC 6.1  4.0 - 10.5 K/uL   RBC 4.22  3.87  - 5.11 MIL/uL   Hemoglobin 10.6 (*) 12.0 - 15.0 g/dL   HCT 81.1 (*) 91.4 - 78.2 %   MCV 78.4  78.0 - 100.0 fL   MCH 25.1 (*) 26.0 - 34.0 pg   MCHC 32.0  30.0 - 36.0 g/dL   RDW 95.6 (*) 21.3 - 08.6 %   Platelets 306  150 - 400 K/uL   Neutrophils Relative % 64  43 - 77 %   Neutro Abs 3.9  1.7 - 7.7 K/uL   Lymphocytes Relative 26  12 - 46 %   Lymphs Abs 1.6  0.7 - 4.0 K/uL   Monocytes Relative 6  3 - 12 %   Monocytes Absolute 0.4  0.1 - 1.0 K/uL   Eosinophils Relative 3  0 - 5 %   Eosinophils  Absolute 0.2  0.0 - 0.7 K/uL   Basophils Relative 1  0 - 1 %   Basophils Absolute 0.0  0.0 - 0.1 K/uL    [place] Transvaginal Non-ob  04/14/2014   CLINICAL DATA:  Lower abdominal pain  EXAM: TRANSABDOMINAL AND TRANSVAGINAL ULTRASOUND OF PELVIS  TECHNIQUE: Both transabdominal and transvaginal ultrasound examinations of the pelvis were performed. Transabdominal technique was performed for global imaging of the pelvis including uterus, ovaries, adnexal regions, and pelvic cul-de-sac. It was necessary to proceed with endovaginal exam following the transabdominal exam to visualize the ovaries and endometrium.  COMPARISON:  None  FINDINGS: Uterus  Measurements: 8.7 x 4.5 x 5.3 cm. No fibroids or other mass visualized.  Endometrium  Thickness: 8.1 mm.  No focal abnormality visualized.  Right ovary  Measurements: 2.8 x 1.4 x 2.7 cm. Normal appearance/no adnexal mass.  Left ovary  Measurements: 1.9 x 1.6 x 1.8 cm. Normal appearance/no adnexal mass.  Other findings: There is a trace amount of pelvic free fluid. Patient had pain during the transvaginal portion of the examination when the probe was moved towards the right ovary.  IMPRESSION: 1. Normal pelvic ultrasound. 2. Patient had pain during the transvaginal portion of the examination when the probe was moved towards the right ovary, but no focal sonographic abnormality was visualized.   Electronically Signed   By: Elige KoHetal  Patel   On: 04/14/2014 11:26   [place] Pelvis  Complete  04/14/2014   CLINICAL DATA:  Lower abdominal pain  EXAM: TRANSABDOMINAL AND TRANSVAGINAL ULTRASOUND OF PELVIS  TECHNIQUE: Both transabdominal and transvaginal ultrasound examinations of the pelvis were performed. Transabdominal technique was performed for global imaging of the pelvis including uterus, ovaries, adnexal regions, and pelvic cul-de-sac. It was necessary to proceed with endovaginal exam following the transabdominal exam to visualize the ovaries and endometrium.  COMPARISON:  None  FINDINGS: Uterus  Measurements: 8.7 x 4.5 x 5.3 cm. No fibroids or other mass visualized.  Endometrium  Thickness: 8.1 mm.  No focal abnormality visualized.  Right ovary  Measurements: 2.8 x 1.4 x 2.7 cm. Normal appearance/no adnexal mass.  Left ovary  Measurements: 1.9 x 1.6 x 1.8 cm. Normal appearance/no adnexal mass.  Other findings: There is a trace amount of pelvic free fluid. Patient had pain during the transvaginal portion of the examination when the probe was moved towards the right ovary.  IMPRESSION: 1. Normal pelvic ultrasound. 2. Patient had pain during the transvaginal portion of the examination when the probe was moved towards the right ovary, but no focal sonographic abnormality was visualized.   Electronically Signed   By: Elige KoHetal  Patel   On: 04/14/2014 11:26     Imaging Review [place] Transvaginal Non-ob  04/14/2014   CLINICAL DATA:  Lower abdominal pain  EXAM: TRANSABDOMINAL AND TRANSVAGINAL ULTRASOUND OF PELVIS  TECHNIQUE: Both transabdominal and transvaginal ultrasound examinations of the pelvis were performed. Transabdominal technique was performed for global imaging of the pelvis including uterus, ovaries, adnexal regions, and pelvic cul-de-sac. It was necessary to proceed with endovaginal exam following the transabdominal exam to visualize the ovaries and endometrium.  COMPARISON:  None  FINDINGS: Uterus  Measurements: 8.7 x 4.5 x 5.3 cm. No fibroids or other mass visualized.  Endometrium   Thickness: 8.1 mm.  No focal abnormality visualized.  Right ovary  Measurements: 2.8 x 1.4 x 2.7 cm. Normal appearance/no adnexal mass.  Left ovary  Measurements: 1.9 x 1.6 x 1.8 cm. Normal appearance/no adnexal mass.  Other findings: There is a  trace amount of pelvic free fluid. Patient had pain during the transvaginal portion of the examination when the probe was moved towards the right ovary.  IMPRESSION: 1. Normal pelvic ultrasound. 2. Patient had pain during the transvaginal portion of the examination when the probe was moved towards the right ovary, but no focal sonographic abnormality was visualized.   Electronically Signed   By: Elige KoHetal  Patel   On: 04/14/2014 11:26   [place] Pelvis Complete  04/14/2014   CLINICAL DATA:  Lower abdominal pain  EXAM: TRANSABDOMINAL AND TRANSVAGINAL ULTRASOUND OF PELVIS  TECHNIQUE: Both transabdominal and transvaginal ultrasound examinations of the pelvis were performed. Transabdominal technique was performed for global imaging of the pelvis including uterus, ovaries, adnexal regions, and pelvic cul-de-sac. It was necessary to proceed with endovaginal exam following the transabdominal exam to visualize the ovaries and endometrium.  COMPARISON:  None  FINDINGS: Uterus  Measurements: 8.7 x 4.5 x 5.3 cm. No fibroids or other mass visualized.  Endometrium  Thickness: 8.1 mm.  No focal abnormality visualized.  Right ovary  Measurements: 2.8 x 1.4 x 2.7 cm. Normal appearance/no adnexal mass.  Left ovary  Measurements: 1.9 x 1.6 x 1.8 cm. Normal appearance/no adnexal mass.  Other findings: There is a trace amount of pelvic free fluid. Patient had pain during the transvaginal portion of the examination when the probe was moved towards the right ovary.  IMPRESSION: 1. Normal pelvic ultrasound. 2. Patient had pain during the transvaginal portion of the examination when the probe was moved towards the right ovary, but no focal sonographic abnormality was visualized.   Electronically Signed    By: Elige KoHetal  Patel   On: 04/14/2014 11:26     EKG Interpretation None      MDM   Final diagnoses:  Dysfunctional uterine bleeding  Pelvic pain   Patient presents with vaginal bleeding and pelvic pain. She has no evidence of fibroids or ovarian cyst and ultrasound. Exam is not consistent with ovarian torsion. She's had pain in the past similar to this with heavy vaginal bleeding however hasn't been like this bad in the past. She has no current heavy bleeding. She is discharged home in good condition [facility]    Rolan BuccoMelanie Ezrael Sam, MD 04/14/14 [phone]

## 2014-04-15 LAB — GC/CHLAMYDIA PROBE AMP
CT PROBE, AMP APTIMA: NEGATIVE
GC Probe RNA: NEGATIVE

## 2014-04-18 ENCOUNTER — Emergency Department (HOSPITAL_COMMUNITY)
Admission: EM | Admit: 2014-04-18 | Discharge: 2014-04-18 | Disposition: A | Discharge status: Home / Self Care | Payer: BC Managed Care – PPO | Attending: Emergency Medicine | Admitting: Emergency Medicine

## 2014-04-18 ENCOUNTER — Emergency Department (HOSPITAL_COMMUNITY): Payer: BC Managed Care – PPO

## 2014-04-18 ENCOUNTER — Encounter (HOSPITAL_COMMUNITY): Payer: Self-pay | Admitting: Emergency Medicine

## 2014-04-18 ENCOUNTER — Emergency Department (INDEPENDENT_AMBULATORY_CARE_PROVIDER_SITE_OTHER)
Admission: EM | Admit: 2014-04-18 | Discharge: 2014-04-18 | Disposition: A | Discharge status: Home / Self Care | Payer: No Typology Code available for payment source | Source: Home / Self Care | Attending: Family Medicine | Admitting: Family Medicine

## 2014-04-18 DIAGNOSIS — M549 Dorsalgia, unspecified: Secondary | ICD-10-CM | POA: Insufficient documentation

## 2014-04-18 DIAGNOSIS — R1033 Periumbilical pain: Secondary | ICD-10-CM | POA: Insufficient documentation

## 2014-04-18 DIAGNOSIS — R102 Pelvic and perineal pain: Secondary | ICD-10-CM

## 2014-04-18 DIAGNOSIS — F17200 Nicotine dependence, unspecified, uncomplicated: Secondary | ICD-10-CM | POA: Insufficient documentation

## 2014-04-18 DIAGNOSIS — N949 Unspecified condition associated with female genital organs and menstrual cycle: Secondary | ICD-10-CM | POA: Insufficient documentation

## 2014-04-18 DIAGNOSIS — Z79899 Other long term (current) drug therapy: Secondary | ICD-10-CM | POA: Insufficient documentation

## 2014-04-18 DIAGNOSIS — R110 Nausea: Secondary | ICD-10-CM | POA: Insufficient documentation

## 2014-04-18 DIAGNOSIS — E079 Disorder of thyroid, unspecified: Secondary | ICD-10-CM | POA: Insufficient documentation

## 2014-04-18 DIAGNOSIS — Z8719 Personal history of other diseases of the digestive system: Secondary | ICD-10-CM | POA: Insufficient documentation

## 2014-04-18 LAB — CBC WITH DIFFERENTIAL/PLATELET
BASOS PCT: 1 % (ref 0–1)
Basophils Absolute: 0 10*3/uL (ref 0.0–0.1)
EOS ABS: 0.3 10*3/uL (ref 0.0–0.7)
EOS PCT: 4 % (ref 0–5)
HEMATOCRIT: 36.3 % (ref 36.0–46.0)
HEMOGLOBIN: 11.6 g/dL — AB (ref 12.0–15.0)
Lymphocytes Relative: 37 % (ref 12–46)
Lymphs Abs: 2.8 10*3/uL (ref 0.7–4.0)
MCH: 25.6 pg — AB (ref 26.0–34.0)
MCHC: 32 g/dL (ref 30.0–36.0)
MCV: 80.1 fL (ref 78.0–100.0)
MONO ABS: 0.4 10*3/uL (ref 0.1–1.0)
MONOS PCT: 6 % (ref 3–12)
NEUTROS PCT: 52 % (ref 43–77)
Neutro Abs: 4 10*3/uL (ref 1.7–7.7)
Platelets: 312 10*3/uL (ref 150–400)
RBC: 4.53 MIL/uL (ref 3.87–5.11)
RDW: 16.9 % — ABNORMAL HIGH (ref 11.5–15.5)
WBC: 7.6 10*3/uL (ref 4.0–10.5)

## 2014-04-18 LAB — POCT URINALYSIS DIP (DEVICE)
Bilirubin Urine: NEGATIVE
GLUCOSE, UA: NEGATIVE mg/dL
Hgb urine dipstick: NEGATIVE
Ketones, ur: NEGATIVE mg/dL
Leukocytes, UA: NEGATIVE
NITRITE: NEGATIVE
Protein, ur: NEGATIVE mg/dL
Specific Gravity, Urine: 1.025 (ref 1.005–1.030)
UROBILINOGEN UA: 0.2 mg/dL (ref 0.0–1.0)
pH: 6.5 (ref 5.0–8.0)

## 2014-04-18 LAB — COMPREHENSIVE METABOLIC PANEL
ALBUMIN: 3.5 g/dL (ref 3.5–5.2)
ALT: <5 U/L (ref 0–35)
AST: <5 U/L (ref 0–37)
Alkaline Phosphatase: 93 U/L (ref 39–117)
BUN: 9 mg/dL (ref 6–23)
CALCIUM: 9.3 mg/dL (ref 8.4–10.5)
CO2: 21 mEq/L (ref 19–32)
CREATININE: 0.53 mg/dL (ref 0.50–1.10)
Chloride: 102 mEq/L (ref 96–112)
GFR calc Af Amer: >90 mL/min (ref >90)
GFR calc non Af Amer: >90 mL/min (ref >90)
Glucose, Bld: 89 mg/dL (ref 70–99)
Potassium: 4.9 mEq/L (ref 3.7–5.3)
Sodium: 136 mEq/L — ABNORMAL LOW (ref 137–147)
TOTAL PROTEIN: 7.4 g/dL (ref 6.0–8.3)
Total Bilirubin: <0.2 mg/dL — ABNORMAL LOW (ref 0.3–1.2)

## 2014-04-18 LAB — POCT PREGNANCY, URINE: Preg Test, Ur: NEGATIVE

## 2014-04-18 MED ORDER — ONDANSETRON HCL 4 MG PO TABS: 4.0000 mg | ORAL_TABLET | Freq: Four times a day (QID) | ORAL | Status: DC

## 2014-04-18 MED ORDER — OXYCODONE-ACETAMINOPHEN 5-325 MG PO TABS: 1.0000 | ORAL_TABLET | Freq: Four times a day (QID) | ORAL | Status: DC | PRN

## 2014-04-18 MED ORDER — IOHEXOL 300 MG/ML  SOLN
100.0000 mL | Freq: Once | INTRAMUSCULAR | Status: AC | PRN
  Administered 2014-04-18: 100 mL via INTRAVENOUS

## 2014-04-18 MED ORDER — MORPHINE SULFATE 4 MG/ML IJ SOLN
4.0000 mg | Freq: Once | INTRAMUSCULAR | Status: AC
  Administered 2014-04-18: 4 mg via INTRAVENOUS
  Filled 2014-04-18: qty 1

## 2014-04-18 MED ORDER — ONDANSETRON HCL 4 MG/2ML IJ SOLN
4.0000 mg | Freq: Once | INTRAMUSCULAR | Status: AC
  Administered 2014-04-18: 4 mg via INTRAVENOUS
  Filled 2014-04-18: qty 2

## 2014-04-18 MED ORDER — OXYCODONE-ACETAMINOPHEN 5-325 MG PO TABS
2.0000 | ORAL_TABLET | Freq: Once | ORAL | Status: AC
  Administered 2014-04-18: 2 via ORAL
  Filled 2014-04-18: qty 2

## 2014-04-18 MED ORDER — SODIUM CHLORIDE 0.9 % IV SOLN
INTRAVENOUS | Status: DC
  Administered 2014-04-18: 17:00:00 via INTRAVENOUS

## 2014-04-18 MED ORDER — SODIUM CHLORIDE 0.9 % IV BOLUS (SEPSIS)
500.0000 mL | Freq: Once | INTRAVENOUS | Status: AC
  Administered 2014-04-18: 500 mL via INTRAVENOUS

## 2014-04-18 MED ORDER — IOHEXOL 300 MG/ML  SOLN
25.0000 mL | Freq: Once | INTRAMUSCULAR | Status: AC | PRN
  Administered 2014-04-18: 25 mL via ORAL

## 2014-04-18 NOTE — ED Provider Notes (Signed)
CSN: [id number]     Arrival date & time 04/18/14  1257 History   First MD Initiated Contact with Patient 04/18/14 [phone]     Chief Complaint  Patient presents with  . Abdominal Pain   (Consider location/radiation/quality/duration/timing/severity/associated sxs/prior Treatment) Patient is a 43 y.o. female presenting with abdominal pain. The history is provided by the patient.  Abdominal Pain Pain location:  RLQ Pain quality: sharp and stabbing   Pain radiates to:  Does not radiate Pain severity:  Moderate Onset quality:  Gradual Duration:  5 days Timing:  Constant Progression:  Worsening Chronicity:  New Context comment:  Seen 4/21 in ER with neg u/s eval of sx, [facility], sx continue , [facility] Associated symptoms: nausea and vaginal bleeding   Associated symptoms: no dysuria, no vaginal discharge and no vomiting     Past Medical History  Diagnosis Date  . Thyroid disease   . Achalasia    Past Surgical History  Procedure Laterality Date  . Cesarean section     Family History  Problem Relation Age of Onset  . Hypertension Mother   . Hypertension Father   . Diabetes Father   . Thyroid disease Sister     radioactive ablation   History  Substance Use Topics  . Smoking status: Current Some Day Smoker -- 0.10 packs/day    Types: Cigarettes  . Smokeless tobacco: Never Used  . Alcohol Use: Yes   OB History   Grav Para Term Preterm Abortions TAB SAB Ect Mult Living   3 3 3  0 0 0 0 0 0 3     Review of Systems  Constitutional: Negative.   Gastrointestinal: Positive for nausea and abdominal pain. Negative for vomiting.  Genitourinary: Positive for vaginal bleeding. Negative for dysuria, urgency, frequency, flank pain and vaginal discharge.    Allergies  Bee venom; Sulfa antibiotics; and Naproxen  Home Medications   Prior to Admission medications   Medication Sig Start Date End Date Taking? Authorizing Provider  albuterol (PROVENTIL  HFA;VENTOLIN HFA) 108 (90 BASE) MCG/ACT inhaler Inhale 1-2 puffs into the lungs every 6 (six) hours as needed for wheezing or shortness of breath. 03/23/14   Lauren Doretha ImusM Parker, PA-C  calcium carbonate (TUMS EX) 750 MG chewable tablet Chew 2 tablets by mouth daily as needed for heartburn.    Historical Provider, MD  ibuprofen (ADVIL,MOTRIN) 200 MG tablet Take 800 mg by mouth every 6 (six) hours as needed for moderate pain.    Historical Provider, MD  levothyroxine (SYNTHROID) 100 MCG tablet Take 1 tablet (100 mcg total) by mouth daily. 02/25/14 02/25/15  Judie BonusElizabeth A Kollar, MD  oxyCODONE-acetaminophen (PERCOCET) 5-325 MG per tablet Take 2 tablets by mouth every 4 (four) hours as needed. 04/14/14   Rolan BuccoMelanie Belfi, MD  promethazine (PHENERGAN) 12.5 MG tablet Take 12.5 mg by mouth every 6 (six) hours as needed for nausea or vomiting.    Historical Provider, MD   BP 149/85  Pulse 74  Temp(Src) 98.4 F (36.9 C) (Oral)  Resp 16  SpO2 100%  LMP 12/11/2013 Physical Exam  Nursing note and vitals reviewed. Constitutional: She is oriented to person, place, and time. She appears well-developed and well-nourished. She appears distressed.  Abdominal: Bowel sounds are normal. She exhibits no distension and no mass. There is tenderness in the suprapubic area. There is guarding. There is no rigidity, no rebound and no CVA tenderness.    Neurological: She is alert and oriented to  person, place, and time.  Skin: Skin is warm and dry.    ED Course  Procedures (including critical care time) Labs Review Labs Reviewed  POCT URINALYSIS DIP (DEVICE)  POCT PREGNANCY, URINE    Imaging Review No results found.   MDM   1. Acute pelvic pain, female        Linna HoffJames D Darcell Yacoub, MD 04/18/14 1534

## 2014-04-18 NOTE — ED Notes (Signed)
Pt c/o constant RLQ pain onset Monday Sx include nauseas, spotting, dysuria, urinary freq, back pain and fevers Denies vag d/c, v/d Alert w/no signs of acute distress.

## 2014-04-18 NOTE — ED Provider Notes (Signed)
CSN: [id number]     Arrival date & time 04/18/14  1548 History   First MD Initiated Contact with Patient 04/18/14 1605     Chief Complaint  Patient presents with  . Abdominal Pain     (Consider location/radiation/quality/duration/timing/severity/associated sxs/prior Treatment) HPI  Patient to the ER sent in from the UC with RLQ pain. She has a history of PID from earlier this year. She was seen on Monday at [facility] ED and had pelvic US and gc/wet prep/urine all of which were found to be completely normal. She feels that this is probably not her pelvis that is hurting because she is also having back pain and some periumbilical pain. The pain is sharp and stabbing, moderate, it has been going on for 5 days and increasingly getting worse. The pain is constant. She was told to go to [facility] but can not be seen until 05/13/2014. The Urgent Care felt that she needed CT scan since pain is persisting. She denies dysuria, vaginal bleeding, vaginal discharge. No vomiting or fevers.  Past Medical History  Diagnosis Date  . Thyroid disease   . Achalasia    Past Surgical History  Procedure Laterality Date  . Cesarean section     Family History  Problem Relation Age of Onset  . Hypertension Mother   . Hypertension Father   . Diabetes Father   . Thyroid disease Sister     radioactive ablation   History  Substance Use Topics  . Smoking status: Current Some Day Smoker -- 0.10 packs/day    Types: Cigarettes  . Smokeless tobacco: Never Used  . Alcohol Use: Yes   OB History   Grav Para Term Preterm Abortions TAB SAB Ect Mult Living   3 3 3  0 0 0 0 0 0 3     Review of Systems   Review of Systems  Gen: no weight loss, fevers, chills, night sweats  Eyes: no discharge or drainage, no occular pain or visual changes  Nose: no epistaxis or rhinorrhea  Mouth: no dental pain, no sore throat  Neck: no neck pain  Lungs:No wheezing, coughing or hemoptysis CV: no chest pain, palpitations,  dependent edema or orthopnea  Abd: + abdominal pain, nausea, No vomiting, diarrhea GU: no dysuria or gross hematuria  MSK:  No muscle weakness or pain Neuro: no headache, no focal neurologic deficits  Skin: no rash or wounds Psyche: no complaints    Allergies  Bee venom; Sulfa antibiotics; and Naproxen  Home Medications   Prior to Admission medications   Medication Sig Start Date End Date Taking? Authorizing Provider  albuterol (PROVENTIL HFA;VENTOLIN HFA) 108 (90 BASE) MCG/ACT inhaler Inhale 1-2 puffs into the lungs every 6 (six) hours as needed for wheezing or shortness of breath. 03/23/14  Yes Lauren Doretha Imus, PA-C  calcium carbonate (TUMS EX) 750 MG chewable tablet Chew 2 tablets by mouth daily as needed for heartburn.   Yes Historical Provider, MD  ibuprofen (ADVIL,MOTRIN) 200 MG tablet Take 800 mg by mouth every 6 (six) hours as needed for moderate pain.   Yes Historical Provider, MD  levothyroxine (SYNTHROID) 100 MCG tablet Take 1 tablet (100 mcg total) by mouth daily. 02/25/14 02/25/15 Yes Judie Bonus, MD  omeprazole (PRILOSEC) 20 MG capsule Take 20 mg by mouth daily.   Yes Historical Provider, MD  oxyCODONE-acetaminophen (PERCOCET) 5-325 MG per tablet Take 2 tablets by mouth every 4 (four) hours as needed. 04/14/14  Yes Rolan Bucco, MD  promethazine (PHENERGAN) 12.5  MG tablet Take 12.5 mg by mouth every 6 (six) hours as needed for nausea or vomiting.   Yes Historical Provider, MD   BP 153/78  Pulse 78  Temp(Src) 98.4 F (36.9 C) (Oral)  Resp 18  Wt 173 lb 4.8 oz (78.608 kg)  SpO2 100%  LMP 12/11/2013 Physical Exam  Nursing note and vitals reviewed. Constitutional: She appears well-developed and well-nourished. No distress.  HENT:  Head: Normocephalic and atraumatic.  Eyes: Pupils are equal, round, and reactive to light.  Neck: Normal range of motion. Neck supple.  Cardiovascular: Normal rate and regular rhythm.   Pulmonary/Chest: Effort normal.  Abdominal: Soft.  There is tenderness in the right lower quadrant.    Neurological: She is alert.  Skin: Skin is warm and dry.    ED Course  Procedures (including critical care time) Labs Review Labs Reviewed  CBC WITH DIFFERENTIAL - Abnormal; Notable for the following:    Hemoglobin 11.6 (*)    MCH 25.6 (*)    RDW 16.9 (*)    All other components within normal limits  COMPREHENSIVE METABOLIC PANEL - Abnormal; Notable for the following:    Sodium 136 (*)    Total Bilirubin <0.2 (*)    All other components within normal limits    Imaging Review Ct Abdomen Pelvis W Contrast  04/18/2014   CLINICAL DATA:  RIGHT lower quadrant pain for 5 days, nausea, pain with urination, history achalasia, thyroid disease  EXAM: CT ABDOMEN AND PELVIS WITH CONTRAST  TECHNIQUE: Multidetector CT imaging of the abdomen and pelvis was performed using the standard protocol following bolus administration of intravenous contrast. Sagittal and coronal MPR images reconstructed from axial data set.  CONTRAST:  100mL OMNIPAQUE IOHEXOL 300 MG/ML SOLN IV. Dilute oral contrast.  COMPARISON:  01/25/2014 CT abdomen and pelvis, pelvic ultrasound 04/14/2014  FINDINGS: Lung bases clear.  Liver, spleen, pancreas, kidneys, and adrenal glands normal appearance.  Splenule anterior to upper spleen.  Stomach decompressed, grossly unremarkable.  Appendix not visualized, no pericecal inflammatory process seen.  Stomach and bowel loops normal appearance.  Unremarkable bladder, uterus, and LEFT adnexa.  Small amount of fluid in RIGHT adnexa adjacent to a normal-sized RIGHT ovary.  RIGHT ovarian cyst seen on the previous exam none identified on current study.  Small rounded fat attenuation RIGHT adnexal nodule 18 x 12 mm identified, question fat outlined by fluid, difficult to exclude a small dermoid by CT.  This was seen on the prior CT exam as well but not visualized on an interval pelvic ultrasound suggesting this does not represent a discrete mass.  Tampon  in vagina.  No mass, adenopathy, free air, hernia, or acute bone lesion.  IMPRESSION: Small amount of free fluid in RIGHT adnexa.  Small fat attenuation nodule in the RIGHT adnexa may represent fat surrounded by small amount of fluid, with no definite ovarian dermoid tumor seen on the prior pelvic ultrasound.  Nonvisualization of appendix.  No other intra-abdominal or intrapelvic abnormalities identified.   Electronically Signed   By: Ulyses SouthwardMark  Boles M.D.   On: 04/18/2014 18:42     EKG Interpretation None      MDM   Final diagnoses:  Pelvic pain    Pt has CT abd/pelv earlier this year which showed abscess and PID. Her gc/wet prep from Monday are normal. She had ultrasound  pelvis done on Monday which was also normal. Pt feels that this is different than her previous PID because it radiates to back and umbilicus. Discussed repeating  pelvic with patient and she prefers we didn't since she already had one. I don't think this is unreasonable since she has no new symptoms. VSS.  I discussed CT scan results with Dr. Oletta LamasGhim, He feels that her seeing womens is appropriate for follow-up. Will give referral to two other gynecologists in the area to see if they can see her sooner.At this time, she needs pain control. I will write her an Rx for pain medication.  Pt does not appear toxic.   Dorthula Matasiffany G Larrie Lucia, PA-C 04/18/14 [phone]

## 2014-04-18 NOTE — ED Notes (Signed)
Pt finished drinking oral contrast. CT notified. Pt states she feels a little better. Vital signs stable. No signs of distress noted.

## 2014-04-18 NOTE — Discharge Instructions (Signed)
Pelvic Pain, Female °Female pelvic pain can be caused by many different things and start from a variety of places. Pelvic pain refers to pain that is located in the lower half of the abdomen and between your hips. The pain may occur over a short period of time (acute) or may be reoccurring (chronic). The cause of pelvic pain may be related to disorders affecting the female reproductive organs (gynecologic), but it may also be related to the bladder, kidney stones, an intestinal complication, or muscle or skeletal problems. Getting help right away for pelvic pain is important, especially if there has been severe, sharp, or a sudden onset of unusual pain. It is also important to get help right away because some types of pelvic pain can be life threatening.  °CAUSES  °Below are only some of the causes of pelvic pain. The causes of pelvic pain can be in one of several categories.  °· Gynecologic. °· Pelvic inflammatory disease. °· Sexually transmitted infection. °· Ovarian cyst or a twisted ovarian ligament (ovarian torsion). °· Uterine lining that grows outside the uterus (endometriosis). °· Fibroids, cysts, or tumors. °· Ovulation. °· Pregnancy. °· Pregnancy that occurs outside the uterus (ectopic pregnancy). °· Miscarriage. °· Labor. °· Abruption of the placenta or ruptured uterus. °· Infection. °· Uterine infection (endometritis). °· Bladder infection. °· Diverticulitis. °· Miscarriage related to a uterine infection (septic abortion). °· Bladder. °· Inflammation of the bladder (cystitis). °· Kidney stone(s). °· Gastrointenstinal. °· Constipation. °· Diverticulitis. °· Neurologic. °· Trauma. °· Feeling pelvic pain because of mental or emotional causes (psychosomatic). °· Cancers of the bowel or pelvis. °EVALUATION  °Your caregiver will want to take a careful history of your concerns. This includes recent changes in your health, a careful gynecologic history of your periods (menses), and a sexual history. Obtaining  your family history and medical history is also important. Your caregiver may suggest a pelvic exam. A pelvic exam will help identify the location and severity of the pain. It also helps in the evaluation of which organ system may be involved. In order to identify the cause of the pelvic pain and be properly treated, your caregiver may order tests. These tests may include:  °· A pregnancy test. °· Pelvic ultrasonography. °· An X-ray exam of the abdomen. °· A urinalysis or evaluation of vaginal discharge. °· Blood tests. °HOME CARE INSTRUCTIONS  °· Only take over-the-counter or prescription medicines for pain, discomfort, or fever as directed by your caregiver.   °· Rest as directed by your caregiver.   °· Eat a balanced diet.   °· Drink enough fluids to make your urine clear or pale yellow, or as directed.   °· Avoid sexual intercourse if it causes pain.   °· Apply warm or cold compresses to the lower abdomen depending on which one helps the pain.   °· Avoid stressful situations.   °· Keep a journal of your pelvic pain. Write down when it started, where the pain is located, and if there are things that seem to be associated with the pain, such as food or your menstrual cycle. °· Follow up with your caregiver as directed.   °SEEK MEDICAL CARE IF: °· Your medicine does not help your pain. °· You have abnormal vaginal discharge. °SEEK IMMEDIATE MEDICAL CARE IF:  °· You have heavy bleeding from the vagina.   °· Your pelvic pain increases.   °· You feel lightheaded or faint.   °· You have chills.   °· You have pain with urination or blood in your urine.   °· You have uncontrolled   diarrhea or vomiting.   °· You have a fever or persistent symptoms for more than 3 days. °· You have a fever and your symptoms suddenly get worse.   °· You are being physically or sexually abused.   °MAKE SURE YOU: °· Understand these instructions. °· Will watch your condition. °· Will get help if you are not doing well or get worse. °Document  Released: 11/07/2004 Document Revised: 06/11/2012 Document Reviewed: 04/01/2012 °[employer]® Patient Information ©2014 [employer]. ° °

## 2014-04-18 NOTE — Discharge Instructions (Signed)
[facility]

## 2014-04-18 NOTE — ED Notes (Signed)
Pt reports RLQ pain since Monday. Was sent here from [facility] for further eval. Having pain with urination and nausea. Denies vomiting.

## 2014-04-19 NOTE — ED Provider Notes (Signed)
Medical screening examination/treatment/procedure(s) were performed by non-physician practitioner and as supervising physician I was immediately available for consultation/collaboration.  Rogelio Winbush Y. Aymara Sassi, MD 04/19/14 0038 

## 2014-04-21 ENCOUNTER — Ambulatory Visit (INDEPENDENT_AMBULATORY_CARE_PROVIDER_SITE_OTHER): Payer: BC Managed Care – PPO | Admitting: Internal Medicine

## 2014-04-21 ENCOUNTER — Encounter: Payer: Self-pay | Admitting: Internal Medicine

## 2014-04-21 VITALS — BP 137/87 | HR 67 | Temp 98.7°F | Ht 61.0 in | Wt 174.7 lb

## 2014-04-21 DIAGNOSIS — R109 Unspecified abdominal pain: Secondary | ICD-10-CM

## 2014-04-21 LAB — POCT URINALYSIS DIPSTICK
BILIRUBIN UA: NEGATIVE
Blood, UA: NEGATIVE
Glucose, UA: NEGATIVE
Ketones, UA: NEGATIVE
LEUKOCYTES UA: NEGATIVE
NITRITE UA: NEGATIVE
Protein, UA: NEGATIVE
Spec Grav, UA: >=1.03
Urobilinogen, UA: 0.2
pH, UA: 5

## 2014-04-21 MED ORDER — METRONIDAZOLE 500 MG PO TABS: 500.0000 mg | ORAL_TABLET | Freq: Two times a day (BID) | ORAL | Status: DC

## 2014-04-21 MED ORDER — IBUPROFEN 800 MG PO TABS: 800.0000 mg | ORAL_TABLET | Freq: Three times a day (TID) | ORAL | Status: DC | PRN

## 2014-04-21 MED ORDER — ONDANSETRON HCL 4 MG PO TABS: 4.0000 mg | ORAL_TABLET | Freq: Four times a day (QID) | ORAL | Status: DC

## 2014-04-21 NOTE — Patient Instructions (Addendum)
General Instructions: Try heat to your abdomen  Try Ibuprofen 800 mg every 8 hours as needed  Try Flagyl 500 mg 2x per day for 7 days    Treatment Goals:  Goals (1 Years of Data) as of 04/21/14   None      Progress Toward Treatment Goals:  Treatment Goal 04/21/2014  Stop smoking unable to assess  Prevent falls at goal    Self Care Goals & Plans:  Self Care Goal 04/21/2014  Manage my medications take my medicines as prescribed; bring my medications to every visit; refill my medications on time  Monitor my health -  Eat healthy foods drink diet soda or water instead of juice or soda; eat more vegetables; eat foods that are low in salt; eat baked foods instead of fried foods; eat fruit for snacks and desserts; eat smaller portions  Be physically active find an activity I enjoy  Stop smoking call [facility] (1-800-QUIT-NOW)  Meeting treatment goals maintain the current self-care plan    No flowsheet data found.   Care Management & Community Referrals:  Referral 04/21/2014  Referrals made for care management support none needed  Referrals made to community resources none       Pelvic Pain, Female Female pelvic pain can be caused by many different things and start from a variety of places. Pelvic pain refers to pain that is located in the lower half of the abdomen and between your hips. The pain may occur over a short period of time (acute) or may be reoccurring (chronic). The cause of pelvic pain may be related to disorders affecting the female reproductive organs (gynecologic), but it may also be related to the bladder, kidney stones, an intestinal complication, or muscle or skeletal problems. Getting help right away for pelvic pain is important, especially if there has been severe, sharp, or a sudden onset of unusual pain. It is also important to get help right away because some types of pelvic pain can be life threatening.  CAUSES  Below are only some of the causes of pelvic  pain. The causes of pelvic pain can be in one of several categories.   Gynecologic.  Pelvic inflammatory disease.  Sexually transmitted infection.  Ovarian cyst or a twisted ovarian ligament (ovarian torsion).  Uterine lining that grows outside the uterus (endometriosis).  Fibroids, cysts, or tumors.  Ovulation.  Pregnancy.  Pregnancy that occurs outside the uterus (ectopic pregnancy).  Miscarriage.  Labor.  Abruption of the placenta or ruptured uterus.  Infection.  Uterine infection (endometritis).  Bladder infection.  Diverticulitis.  Miscarriage related to a uterine infection (septic abortion).  Bladder.  Inflammation of the bladder (cystitis).  Kidney stone(s).  Gastrointenstinal.  Constipation.  Diverticulitis.  Neurologic.  Trauma.  Feeling pelvic pain because of mental or emotional causes (psychosomatic).  Cancers of the bowel or pelvis. EVALUATION  Your caregiver will want to take a careful history of your concerns. This includes recent changes in your health, a careful gynecologic history of your periods (menses), and a sexual history. Obtaining your family history and medical history is also important. Your caregiver may suggest a pelvic exam. A pelvic exam will help identify the location and severity of the pain. It also helps in the evaluation of which organ system may be involved. In order to identify the cause of the pelvic pain and be properly treated, your caregiver may order tests. These tests may include:   A pregnancy test.  Pelvic ultrasonography.  An X-ray exam of the  abdomen.  A urinalysis or evaluation of vaginal discharge.  Blood tests. HOME CARE INSTRUCTIONS   Only take over-the-counter or prescription medicines for pain, discomfort, or fever as directed by your caregiver.   Rest as directed by your caregiver.   Eat a balanced diet.   Drink enough fluids to make your urine clear or pale yellow, or as directed.    Avoid sexual intercourse if it causes pain.   Apply warm or cold compresses to the lower abdomen depending on which one helps the pain.   Avoid stressful situations.   Keep a journal of your pelvic pain. Write down when it started, where the pain is located, and if there are things that seem to be associated with the pain, such as food or your menstrual cycle.  Follow up with your caregiver as directed.  SEEK MEDICAL CARE IF:  Your medicine does not help your pain.  You have abnormal vaginal discharge. SEEK IMMEDIATE MEDICAL CARE IF:   You have heavy bleeding from the vagina.   Your pelvic pain increases.   You feel lightheaded or faint.   You have chills.   You have pain with urination or blood in your urine.   You have uncontrolled diarrhea or vomiting.   You have a fever or persistent symptoms for more than 3 days.  You have a fever and your symptoms suddenly get worse.   You are being physically or sexually abused.  MAKE SURE YOU:  Understand these instructions.  Will watch your condition.  Will get help if you are not doing well or get worse. Document Released: 11/07/2004 Document Revised: 06/11/2012 Document Reviewed: 04/01/2012 [facility] Patient Information 2014 [place], [place].  Ondansetron tablets What is this medicine? ONDANSETRON (on DAN se tron) is used to treat nausea and vomiting caused by chemotherapy. It is also used to prevent or treat nausea and vomiting after surgery. This medicine may be used for other purposes; ask your health care provider or pharmacist if you have questions. COMMON BRAND NAME(S): Zofran What should I tell my health care provider before I take this medicine? They need to know if you have any of these conditions: -heart disease -history of irregular heartbeat -liver disease -low levels of magnesium or potassium in the blood -an unusual or allergic reaction to ondansetron, granisetron, other medicines, foods,  dyes, or preservatives -pregnant or trying to get pregnant -breast-feeding How should I use this medicine? Take this medicine by mouth with a glass of water. Follow the directions on your prescription label. Take your doses at regular intervals. Do not take your medicine more often than directed. Talk to your pediatrician regarding the use of this medicine in children. Special care may be needed. Overdosage: If you think you have taken too much of this medicine contact a poison control center or emergency room at once. NOTE: This medicine is only for you. Do not share this medicine with others. What if I miss a dose? If you miss a dose, take it as soon as you can. If it is almost time for your next dose, take only that dose. Do not take double or extra doses. What may interact with this medicine? Do not take this medicine with any of the following medications: -apomorphine -certain medicines for fungal infections like fluconazole, itraconazole, ketoconazole, posaconazole, voriconazole -cisapride -dofetilide -dronedarone -pimozide -thioridazine -ziprasidone  This medicine may also interact with the following medications: -carbamazepine -certain medicines for depression, anxiety, or psychotic disturbances -fentanyl -linezolid -MAOIs like Carbex, Eldepryl, Marplan,  Nardil, and Parnate -methylene blue (injected into a vein) -other medicines that prolong the QT interval (cause an abnormal heart rhythm) -phenytoin -rifampicin -tramadol This list may not describe all possible interactions. Give your health care provider a list of all the medicines, herbs, non-prescription drugs, or dietary supplements you use. Also tell them if you smoke, drink alcohol, or use illegal drugs. Some items may interact with your medicine. What should I watch for while using this medicine? Check with your doctor or health care professional right away if you have any sign of an allergic reaction. What side  effects may I notice from receiving this medicine? Side effects that you should report to your doctor or health care professional as soon as possible: -allergic reactions like skin rash, itching or hives, swelling of the face, lips or tongue -breathing problems -confusion -dizziness -fast or irregular heartbeat -feeling faint or lightheaded, falls -fever and chills -loss of balance or coordination -seizures -sweating -swelling of the hands or feet -tightness in the chest -tremors -unusually weak or tired Side effects that usually do not require medical attention (report to your doctor or health care professional if they continue or are bothersome): -constipation or diarrhea -headache This list may not describe all possible side effects. Call your doctor for medical advice about side effects. You may report side effects to FDA at 1-800-FDA-1088. Where should I keep my medicine? Keep out of the reach of children. Store between 2 and 30 degrees C (36 and 86 degrees F). Throw away any unused medicine after the expiration date. NOTE: This sheet is a summary. It may not cover all possible information. If you have questions about this medicine, talk to your doctor, pharmacist, or health care provider.  2014, Elsevier/Gold Standard. (2013-09-17 16:27:45)  Metronidazole tablets or capsules What is this medicine? METRONIDAZOLE (me troe NI da zole) is an antiinfective. It is used to treat certain kinds of bacterial and protozoal infections. It will not work for colds, flu, or other viral infections. This medicine may be used for other purposes; ask your health care provider or pharmacist if you have questions. COMMON BRAND NAME(S): Flagyl What should I tell my health care provider before I take this medicine? They need to know if you have any of these conditions: -anemia or other blood disorders -disease of the nervous system -fungal or yeast infection -if you drink alcohol containing  drinks -liver disease -seizures -an unusual or allergic reaction to metronidazole, or other medicines, foods, dyes, or preservatives -pregnant or trying to get pregnant -breast-feeding How should I use this medicine? Take this medicine by mouth with a full glass of water. Follow the directions on the prescription label. Take your medicine at regular intervals. Do not take your medicine more often than directed. Take all of your medicine as directed even if you think you are better. Do not skip doses or stop your medicine early. Talk to your pediatrician regarding the use of this medicine in children. Special care may be needed. Overdosage: If you think you have taken too much of this medicine contact a poison control center or emergency room at once. NOTE: This medicine is only for you. Do not share this medicine with others. What if I miss a dose? If you miss a dose, take it as soon as you can. If it is almost time for your next dose, take only that dose. Do not take double or extra doses. What may interact with this medicine? Do not take this medicine with  any of the following medications: -alcohol or any product that contains alcohol -amprenavir oral solution -cisapride -disulfiram -dofetilide -dronedarone -paclitaxel injection -pimozide -ritonavir oral solution -sertraline oral solution -sulfamethoxazole-trimethoprim injection -thioridazine -ziprasidone This medicine may also interact with the following medications: -cimetidine -lithium -other medicines that prolong the QT interval (cause an abnormal heart rhythm) -phenobarbital -phenytoin -warfarin This list may not describe all possible interactions. Give your health care provider a list of all the medicines, herbs, non-prescription drugs, or dietary supplements you use. Also tell them if you smoke, drink alcohol, or use illegal drugs. Some items may interact with your medicine. What should I watch for while using this  medicine? Tell your doctor or health care professional if your symptoms do not improve or if they get worse. You may get drowsy or dizzy. Do not drive, use machinery, or do anything that needs mental alertness until you know how this medicine affects you. Do not stand or sit up quickly, especially if you are an older patient. This reduces the risk of dizzy or fainting spells. Avoid alcoholic drinks while you are taking this medicine and for three days afterward. Alcohol may make you feel dizzy, sick, or flushed. If you are being treated for a sexually transmitted disease, avoid sexual contact until you have finished your treatment. Your sexual partner may also need treatment. What side effects may I notice from receiving this medicine? Side effects that you should report to your doctor or health care professional as soon as possible: -allergic reactions like skin rash or hives, swelling of the face, lips, or tongue -confusion, clumsiness -difficulty speaking -discolored or sore mouth -dizziness -fever, infection -numbness, tingling, pain or weakness in the hands or feet -trouble passing urine or change in the amount of urine -redness, blistering, peeling or loosening of the skin, including inside the mouth -seizures -unusually weak or tired -vaginal irritation, dryness, or discharge Side effects that usually do not require medical attention (report to your doctor or health care professional if they continue or are bothersome): -diarrhea -headache -irritability -metallic taste -nausea -stomach pain or cramps -trouble sleeping This list may not describe all possible side effects. Call your doctor for medical advice about side effects. You may report side effects to FDA at 1-800-FDA-1088. Where should I keep my medicine? Keep out of the reach of children. Store at room temperature below 25 degrees C (77 degrees F). Protect from light. Keep container tightly closed. Throw away any unused  medicine after the expiration date. NOTE: This sheet is a summary. It may not cover all possible information. If you have questions about this medicine, talk to your doctor, pharmacist, or health care provider.  2014, Elsevier/Gold Standard. (2013-06-10 14:08:26)  Abdominal Pain, Adult Many things can cause abdominal pain. Usually, abdominal pain is not caused by a disease and will improve without treatment. It can often be observed and treated at home. Your health care provider will do a physical exam and possibly order blood tests and X-rays to help determine the seriousness of your pain. However, in many cases, more time must pass before a clear cause of the pain can be found. Before that point, your health care provider may not know if you need more testing or further treatment. HOME CARE INSTRUCTIONS  Monitor your abdominal pain for any changes. The following actions may help to alleviate any discomfort you are experiencing:  Only take over-the-counter or prescription medicines as directed by your health care provider.  Do not take laxatives unless directed to  do so by your health care provider.  Try a clear liquid diet (broth, tea, or water) as directed by your health care provider. Slowly move to a bland diet as tolerated. SEEK MEDICAL CARE IF:  You have unexplained abdominal pain.  You have abdominal pain associated with nausea or diarrhea.  You have pain when you urinate or have a bowel movement.  You experience abdominal pain that wakes you in the night.  You have abdominal pain that is worsened or improved by eating food.  You have abdominal pain that is worsened with eating fatty foods. SEEK IMMEDIATE MEDICAL CARE IF:   Your pain does not go away within 2 hours.  You have a fever.  You keep throwing up (vomiting).  Your pain is felt only in portions of the abdomen, such as the right side or the left lower portion of the abdomen.  You pass bloody or black tarry  stools. MAKE SURE YOU:  Understand these instructions.   Will watch your condition.   Will get help right away if you are not doing well or get worse.  Document Released: 09/20/2005 Document Revised: 10/01/2013 Document Reviewed: 08/20/2013 [facility] Patient Information 2014 [place], [place]. Smoking Cessation Quitting smoking is important to your health and has many advantages. However, it is not always easy to quit since nicotine is a very addictive drug. Often times, people try 3 times or more before being able to quit. This document explains the best ways for you to prepare to quit smoking. Quitting takes hard work and a lot of effort, but you can do it. ADVANTAGES OF QUITTING SMOKING  You will live longer, feel better, and live better.  Your body will feel the impact of quitting smoking almost immediately.  Within 20 minutes, blood pressure decreases. Your pulse returns to its normal level.  After 8 hours, carbon monoxide levels in the blood return to normal. Your oxygen level increases.  After 24 hours, the chance of having a heart attack starts to decrease. Your breath, hair, and body stop smelling like smoke.  After 48 hours, damaged nerve endings begin to recover. Your sense of taste and smell improve.  After 72 hours, the body is virtually free of nicotine. Your bronchial tubes relax and breathing becomes easier.  After 2 to 12 weeks, lungs can hold more air. Exercise becomes easier and circulation improves.  The risk of having a heart attack, stroke, cancer, or lung disease is greatly reduced.  After 1 year, the risk of coronary heart disease is cut in half.  After 5 years, the risk of stroke falls to the same as a nonsmoker.  After 10 years, the risk of lung cancer is cut in half and the risk of other cancers decreases significantly.  After 15 years, the risk of coronary heart disease drops, usually to the level of a nonsmoker.  If you are pregnant, quitting  smoking will improve your chances of having a healthy baby.  The people you live with, especially any children, will be healthier.  You will have extra money to spend on things other than cigarettes. QUESTIONS TO THINK ABOUT BEFORE ATTEMPTING TO QUIT You may want to talk about your answers with your caregiver.  Why do you want to quit?  If you tried to quit in the past, what helped and what did not?  What will be the most difficult situations for you after you quit? How will you plan to handle them?  Who can help you through the  tough times? Your family? Friends? A caregiver?  What pleasures do you get from smoking? What ways can you still get pleasure if you quit? Here are some questions to ask your caregiver:  How can you help me to be successful at quitting?  What medicine do you think would be best for me and how should I take it?  What should I do if I need more help?  What is smoking withdrawal like? How can I get information on withdrawal? GET READY  Set a quit date.  Change your environment by getting rid of all cigarettes, ashtrays, matches, and lighters in your home, car, or work. Do not let people smoke in your home.  Review your past attempts to quit. Think about what worked and what did not. GET SUPPORT AND ENCOURAGEMENT You have a better chance of being successful if you have help. You can get support in many ways.  Tell your family, friends, and co-workers that you are going to quit and need their support. Ask them not to smoke around you.  Get individual, group, or telephone counseling and support. Programs are available at [employer] and health centers. Call your local health department for information about programs in your area.  Spiritual beliefs and practices may help some smokers quit.  Download a "quit meter" on your computer to keep track of quit statistics, such as how long you have gone without smoking, cigarettes not smoked, and money  saved.  Get a self-help book about quitting smoking and staying off of tobacco. LEARN NEW SKILLS AND BEHAVIORS  Distract yourself from urges to smoke. Talk to someone, go for a walk, or occupy your time with a task.  Change your normal routine. Take a different route to work. Drink tea instead of coffee. Eat breakfast in a different place.  Reduce your stress. Take a hot bath, exercise, or read a book.  Plan something enjoyable to do every day. Reward yourself for not smoking.  Explore interactive web-based programs that specialize in helping you quit. GET MEDICINE AND USE IT CORRECTLY Medicines can help you stop smoking and decrease the urge to smoke. Combining medicine with the above behavioral methods and support can greatly increase your chances of successfully quitting smoking.  Nicotine replacement therapy helps deliver nicotine to your body without the negative effects and risks of smoking. Nicotine replacement therapy includes nicotine gum, lozenges, inhalers, nasal sprays, and skin patches. Some may be available over-the-counter and others require a prescription.  Antidepressant medicine helps people abstain from smoking, but how this works is unknown. This medicine is available by prescription.  Nicotinic receptor partial agonist medicine simulates the effect of nicotine in your brain. This medicine is available by prescription. Ask your caregiver for advice about which medicines to use and how to use them based on your health history. Your caregiver will tell you what side effects to look out for if you choose to be on a medicine or therapy. Carefully read the information on the package. Do not use any other product containing nicotine while using a nicotine replacement product.  RELAPSE OR DIFFICULT SITUATIONS Most relapses occur within the first 3 months after quitting. Do not be discouraged if you start smoking again. Remember, most people try several times before finally  quitting. You may have symptoms of withdrawal because your body is used to nicotine. You may crave cigarettes, be irritable, feel very hungry, cough often, get headaches, or have difficulty concentrating. The withdrawal symptoms are only temporary. They  are strongest when you first quit, but they will go away within 10 14 days. To reduce the chances of relapse, try to:  Avoid drinking alcohol. Drinking lowers your chances of successfully quitting.  Reduce the amount of caffeine you consume. Once you quit smoking, the amount of caffeine in your body increases and can give you symptoms, such as a rapid heartbeat, sweating, and anxiety.  Avoid smokers because they can make you want to smoke.  Do not let weight gain distract you. Many smokers will gain weight when they quit, usually less than 10 pounds. Eat a healthy diet and stay active. You can always lose the weight gained after you quit.  Find ways to improve your mood other than smoking. FOR MORE INFORMATION  www.smokefree.gov  Document Released: 12/05/2001 Document Revised: 06/11/2012 Document Reviewed: 03/21/2012 [facility] Patient Information 2014 [place], [place].

## 2014-04-21 NOTE — Progress Notes (Signed)
   Subjective:    Patient ID: Annette AlbeeBridget Anderson, female    DOB: February 13, 1971, 43 y.o.   MRN: [id number]  HPI Comments: 43 y.o Past Medical History Thyroid disease (hypo), Achalasia, GERD, anemia, PID (12/2013-01/2014 tx'ed with antibiotics)  She presents for: BP 137/87 1)Right lower abdominal pain and right back pain worsening x 1 week associated with low grade fever (100 F relieved with Advil). Abdominal pain is daily and feels like "ringing out" a bath cloth or twisting in her RLQ ab and her right back has a throbbing sensation 8.5/10.  She states she tries to be still to get comfortable but cant get in a comfortable position.  She used pillows to help her sleep better at night because of the pain.  She has tried Percocet which is not helping.  Recent GC/C negative and wet prep negative except few clue cells. She also has had vaginal spotting on and off for the last week now off.  She states her menstrual cycle is irregular and she will go 3-4 months w/o having a menstrual cycle and then have one.  CMET, CBC 04/18/14 wnl. Urine preg neg 04/18/14.  Reviewed prior imaging scans she was tx'ed for PID in 2015 b/c she had an abscess on imaging noted. She also c/o pressure with urination and hurts to urinate but it does not burn. It also hurts to hold her urine.  She has been urinating 3-4 x per night and when she goes she urinates only a few drops.  She has f/u appt a womens 05/13/14 and she recently has been seen 4/21 and 4/25 in the ED for these sx's.                                 Review of Systems  Constitutional: Positive for appetite change.       Decreased appetite   Gastrointestinal: Positive for nausea and abdominal pain. Negative for vomiting and diarrhea.       Objective:   Physical Exam  Nursing note and vitals reviewed. Constitutional: She is oriented to person, place, and time. Vital signs are normal. She appears well-developed and well-nourished. She is cooperative. No distress.  HENT:    Head: Normocephalic and atraumatic.  Mouth/Throat: Oropharynx is clear and moist and mucous membranes are normal. No oropharyngeal exudate.  Eyes: Conjunctivae are normal. Pupils are equal, round, and reactive to light. Right eye exhibits no discharge. Left eye exhibits no discharge. No scleral icterus.  Cardiovascular: Normal rate, regular rhythm, S1 normal, S2 normal and normal heart sounds.   No murmur heard. No lower ext edema   Pulmonary/Chest: Effort normal and breath sounds normal. No respiratory distress. She has no wheezes.  Abdominal: Soft. Bowel sounds are normal. There is tenderness in the right lower quadrant. There is CVA tenderness.    Neg Rovsings sign  Right CVA ttp   Neurological: She is alert and oriented to person, place, and time. Gait normal.  Skin: Skin is warm, dry and intact. No rash noted. She is not diaphoretic.  Psychiatric: She has a normal mood and affect. Her speech is normal and behavior is normal. Judgment and thought content normal. Cognition and memory are normal.          Assessment & Plan:  F/u with OB/GYN

## 2014-04-21 NOTE — Assessment & Plan Note (Addendum)
Right lower quandrant and CVA ttp likely ob/gyn related.  GC/C labs recently negative.  Pt does have h/o PID with 4.8 x 3.4 x 2.6 cm abscess noted on imaging 01/23/2014 and was tx'ed with 14 day course abx.  Urine dipstick negative today will not proceed with UA/Urine Culture. CMET and CBC recently not sig abnormal Reviewed prior imaging US transvaginal 04/14/14 normal with pain with probing the transvaginal area toward the right ovary. No US abnormality noted.  Reviewed CT 4/25 with small amt of free fluid in the right adnexa; no right ovarian cyst id'ed; small rounded fat attenuation right adnexal nodule 18/12 mm ?fat outlined by fluid cant exclude small dermoid by CT and appendix not visualized but no pericecal inflamm process seen. Recently the patient had wet prep with few clue cells Will tx with Flagyl 500 mg bid x 7 days  Associated with nausea will Rx Zofran prn Patient can try heat and use Ibuprofen 800 mg q8 prn  F/u with OB/GYN 05/13/14 called today to try to get appt moved sooner and they will call the patient with a cancellation.

## 2014-04-22 ENCOUNTER — Inpatient Hospital Stay (HOSPITAL_COMMUNITY)
Admission: AD | Admit: 2014-04-22 | Discharge: 2014-04-22 | Disposition: A | Discharge status: Home / Self Care | Payer: No Typology Code available for payment source | Source: Ambulatory Visit | Attending: Obstetrics & Gynecology | Admitting: Obstetrics & Gynecology

## 2014-04-22 ENCOUNTER — Encounter (HOSPITAL_COMMUNITY): Payer: Self-pay | Admitting: *Deleted

## 2014-04-22 ENCOUNTER — Telehealth: Payer: Self-pay | Admitting: *Deleted

## 2014-04-22 DIAGNOSIS — R102 Pelvic and perineal pain: Secondary | ICD-10-CM

## 2014-04-22 DIAGNOSIS — N949 Unspecified condition associated with female genital organs and menstrual cycle: Secondary | ICD-10-CM | POA: Insufficient documentation

## 2014-04-22 DIAGNOSIS — N938 Other specified abnormal uterine and vaginal bleeding: Secondary | ICD-10-CM | POA: Insufficient documentation

## 2014-04-22 DIAGNOSIS — N925 Other specified irregular menstruation: Secondary | ICD-10-CM | POA: Insufficient documentation

## 2014-04-22 DIAGNOSIS — F17200 Nicotine dependence, unspecified, uncomplicated: Secondary | ICD-10-CM | POA: Insufficient documentation

## 2014-04-22 DIAGNOSIS — R1031 Right lower quadrant pain: Secondary | ICD-10-CM | POA: Insufficient documentation

## 2014-04-22 DIAGNOSIS — N926 Irregular menstruation, unspecified: Secondary | ICD-10-CM | POA: Insufficient documentation

## 2014-04-22 DIAGNOSIS — R109 Unspecified abdominal pain: Secondary | ICD-10-CM | POA: Insufficient documentation

## 2014-04-22 LAB — URINALYSIS, ROUTINE W REFLEX MICROSCOPIC
Bilirubin Urine: NEGATIVE
Glucose, UA: NEGATIVE mg/dL
Hgb urine dipstick: NEGATIVE
KETONES UR: NEGATIVE mg/dL
LEUKOCYTES UA: NEGATIVE
NITRITE: NEGATIVE
PH: 5.5 (ref 5.0–8.0)
Protein, ur: NEGATIVE mg/dL
UROBILINOGEN UA: 0.2 mg/dL (ref 0.0–1.0)

## 2014-04-22 LAB — POCT PREGNANCY, URINE: Preg Test, Ur: NEGATIVE

## 2014-04-22 MED ORDER — OXYCODONE-ACETAMINOPHEN 5-325 MG PO TABS: 1.0000 | ORAL_TABLET | Freq: Four times a day (QID) | ORAL | Status: DC | PRN

## 2014-04-22 MED ORDER — HYDROMORPHONE HCL PF 1 MG/ML IJ SOLN
1.0000 mg | Freq: Once | INTRAMUSCULAR | Status: AC
  Administered 2014-04-22: 1 mg via INTRAMUSCULAR
  Filled 2014-04-22: qty 1

## 2014-04-22 NOTE — MAU Provider Note (Signed)
History     CSN: [id number]  Arrival date and time: 04/22/14 1323   First Provider Initiated Contact with Patient 04/22/14 1423      Chief Complaint  Patient presents with  . Abdominal Pain   HPI [name] is a 43 y.o. [id number] who presents to MAU today with complaint of abdominal pain. The patient states that pain is worse in the RLQ and radiates to her lower back and also across her pelvis. She has been worked up for this pain numerous times. [facility] Also had essentially negative CT scant. ? Complex ovarian cyst noted on US last week. Patient states a history of irregular periods x 1 year. She does not have regular bleeding. She does have heavy bleeding at times and also just spotting at times as well. She endorses a fever of 101 this morning that resolved with motrin. She rates her pain at 9/10 now. She also states that she was given Rx for Ibuprofen but that [facility]   OB History   Grav Para Term Preterm Abortions TAB SAB Ect Mult Living   3 3 3  0 0 0 0 0 0 3      Past Medical History  Diagnosis Date  . Thyroid disease   . Achalasia     Past Surgical History  Procedure Laterality Date  . Cesarean section      Family History  Problem Relation Age of Onset  . Hypertension Mother   . Hypertension Father   . Diabetes Father   . Thyroid disease Sister     radioactive ablation    History  Substance Use Topics  . Smoking status: Current Some Day Smoker -- 0.10 packs/day    Types: Cigarettes  . Smokeless tobacco: Never Used  . Alcohol Use: Yes    Allergies:  Allergies  Allergen Reactions  . Bee Venom Anaphylaxis  . Sulfa Antibiotics Anaphylaxis and Hives  . Naproxen Hives    No prescriptions prior to admission    Review of Systems  Constitutional: Positive for fever and malaise/fatigue.  Gastrointestinal: Positive for abdominal pain and constipation. Negative for nausea,  vomiting and diarrhea.  Genitourinary: Negative for dysuria, urgency, frequency and hematuria.       Neg - vaginal bleeding, discharge + urinary pressure and polyuria  Musculoskeletal: Positive for back pain.   Physical Exam   Blood pressure 159/92, pulse 70, temperature 98.9 F (37.2 C), temperature source Oral, resp. rate 18, height 5' 1.5" (1.562 m), weight 172 lb (78.019 kg), last menstrual period 12/10/2013.  Physical Exam  Constitutional: She is oriented to person, place, and time. She appears well-developed and well-nourished. No distress.  HENT:  Head: Normocephalic and atraumatic.  Cardiovascular: Normal rate, regular rhythm and normal heart sounds.   Respiratory: Effort normal and breath sounds normal. No respiratory distress.  GI: Soft. Bowel sounds are normal. She exhibits no distension and no mass. There is tenderness (moderate diffuse tenderness to palpation). There is no rebound, no guarding and no CVA tenderness.  Genitourinary: Uterus is tender (mild). Uterus is not enlarged. Cervix exhibits no motion tenderness, no discharge and no friability. Right adnexum displays tenderness (mild). Right adnexum displays no mass. Left adnexum displays no mass and no tenderness.  Neurological: She is alert and oriented to person, place, and time.  Skin: Skin is warm and dry. No erythema.  Psychiatric: She has a normal mood and affect.  Results for orders placed during the hospital encounter of 04/22/14 (from the past 24 hour(s))  URINALYSIS, ROUTINE W REFLEX MICROSCOPIC     Status: Abnormal   Collection Time    04/22/14  2:32 PM      Result Value Ref Range   Color, Urine YELLOW  YELLOW   APPearance CLEAR  CLEAR   Specific Gravity, Urine >1.030 (*) 1.005 - 1.030   pH 5.5  5.0 - 8.0   Glucose, UA NEGATIVE  NEGATIVE mg/dL   Hgb urine dipstick NEGATIVE  NEGATIVE   Bilirubin Urine NEGATIVE  NEGATIVE   Ketones, ur NEGATIVE  NEGATIVE mg/dL   Protein, ur NEGATIVE  NEGATIVE mg/dL    Urobilinogen, UA 0.2  0.0 - 1.0 mg/dL   Nitrite NEGATIVE  NEGATIVE   Leukocytes, UA NEGATIVE  NEGATIVE  POCT PREGNANCY, URINE     Status: None   Collection Time    04/22/14  2:33 PM      Result Value Ref Range   Preg Test, Ur NEGATIVE  NEGATIVE    MAU Course  Procedures None  MDM UPT - negative UA today  Assessment and Plan  A: AUB  P: Discharge home Rx for Percocet given to patient [facility] and given appointment for further evaluation and possible endometrial biopsy on 04/23/14 Patient may return to MAU as needed or if her condition were to change or worsen  Freddi StarrJulie N Ethier, PA-C  04/22/2014, 6:23 PM

## 2014-04-22 NOTE — Telephone Encounter (Addendum)
Pt left message stating that she needs an appt sooner than 5/20 and wants to be put on the cancellation list.  Call returned to pt's desired tel # and was answered [facility] Pt was not there. I left message that her doctor's office was returning her call and we would call back. She may also call us.  **Pt needs to be informed that we do not keep a cancellation list however she is welcome to call back frequently to check for cancellations.

## 2014-04-22 NOTE — Progress Notes (Signed)
INTERNAL MEDICINE TEACHING ATTENDING ADDENDUM - Graciana Sessa, MD: I reviewed and discussed at the time of visit with the resident Dr. McLean, the patient's medical history, physical examination, diagnosis and results of tests and treatment and I agree with the patient's care as documented. 

## 2014-04-22 NOTE — Discharge Instructions (Signed)
Endometrial Biopsy Endometrial biopsy is a procedure in which a tissue sample is taken from inside the uterus. The tissue sample is then looked at under a microscope to see if the tissue is normal or abnormal. The endometrium is the lining of the uterus. This procedure helps determine where you are in your menstrual cycle and how hormone levels are affecting the lining of the uterus. This procedure may also be used to evaluate uterine bleeding or to diagnose endometrial cancer, tuberculosis, polyps, or inflammatory conditions.  LET YOUR HEALTH CARE PROVIDER KNOW ABOUT:  Any allergies you have.  All medicines you are taking, including vitamins, herbs, eye drops, creams, and over-the-counter medicines.  Previous problems you or members of your family have had with the use of anesthetics.  Any blood disorders you have.  Previous surgeries you have had.  Medical conditions you have.  Possibility of pregnancy. RISKS AND COMPLICATIONS Generally, this is a safe procedure. However, as with any procedure, complications can occur. Possible complications include:  Bleeding.  Pelvic infection.  Puncture of the uterine wall with the biopsy device (rare). BEFORE THE PROCEDURE   Keep a record of your menstrual cycles as directed by your health care provider. You may need to schedule your procedure for a specific time in your cycle.  You may want to bring a sanitary pad to wear home after the procedure.  Arrange for someone to drive you home after the procedure if you will be given a medicine to help you relax (sedative). PROCEDURE   You may be given a sedative to relax you.  You will lie on an exam table with your feet and legs supported as in a pelvic exam.  Your health care provider will insert an instrument (speculum) into your vagina to see your cervix.  Your cervix will be cleansed with an antiseptic solution. A medicine (local anesthetic) will be used to numb the cervix.  A forceps  instrument (tenaculum) will be used to hold your cervix steady for the biopsy.  A thin, rodlike instrument (uterine sound) will be inserted through your cervix to determine the length of your uterus and the location where the biopsy sample will be removed.  A thin, flexible tube (catheter) will be inserted through your cervix and into the uterus. The catheter is used to collect the biopsy sample from your endometrial tissue.  The catheter and speculum will then be removed, and the tissue sample will be sent to a lab for examination. AFTER THE PROCEDURE  You will rest in a recovery area until you are ready to go home.  You may have mild cramping and a small amount of vaginal bleeding for a few days after the procedure. This is normal.  Make sure you find out how to get your test results. Document Released: 04/13/2005 Document Revised: 08/13/2013 Document Reviewed: 05/28/2013 [employer] Patient Information 2014 [employer].  

## 2014-04-22 NOTE — MAU Note (Addendum)
Still having pain in rt lower abd, around to back.  Have been to doctors and hosp, no one seems to know and it is getting worse.  Getting hard to use the bathroom, bm and urinate.

## 2014-04-23 ENCOUNTER — Ambulatory Visit (INDEPENDENT_AMBULATORY_CARE_PROVIDER_SITE_OTHER): Payer: BC Managed Care – PPO | Admitting: Obstetrics & Gynecology

## 2014-04-23 ENCOUNTER — Encounter (HOSPITAL_COMMUNITY): Payer: Self-pay | Admitting: *Deleted

## 2014-04-23 ENCOUNTER — Inpatient Hospital Stay (HOSPITAL_COMMUNITY)
Admission: AD | Admit: 2014-04-23 | Discharge: 2014-04-23 | Disposition: A | Discharge status: Home / Self Care | Payer: BC Managed Care – PPO | Source: Ambulatory Visit | Attending: Obstetrics & Gynecology | Admitting: Obstetrics & Gynecology

## 2014-04-23 ENCOUNTER — Encounter: Payer: Self-pay | Admitting: Gastroenterology

## 2014-04-23 ENCOUNTER — Encounter: Payer: Self-pay | Admitting: Obstetrics & Gynecology

## 2014-04-23 VITALS — BP 139/86 | HR 73 | Temp 98.3°F | Ht 61.0 in | Wt 171.9 lb

## 2014-04-23 DIAGNOSIS — R109 Unspecified abdominal pain: Secondary | ICD-10-CM

## 2014-04-23 DIAGNOSIS — Z01812 Encounter for preprocedural laboratory examination: Secondary | ICD-10-CM

## 2014-04-23 LAB — POCT PREGNANCY, URINE: PREG TEST UR: NEGATIVE

## 2014-04-23 MED ORDER — PROMETHAZINE HCL 25 MG/ML IJ SOLN
25.0000 mg | Freq: Once | INTRAMUSCULAR | Status: AC
  Administered 2014-04-23: 25 mg via INTRAMUSCULAR
  Filled 2014-04-23: qty 1

## 2014-04-23 MED ORDER — HYDROMORPHONE HCL PF 1 MG/ML IJ SOLN
2.0000 mg | Freq: Once | INTRAMUSCULAR | Status: AC
  Administered 2014-04-23: 2 mg via INTRAMUSCULAR
  Filled 2014-04-23: qty 2

## 2014-04-23 NOTE — MAU Note (Signed)
Patient states she has been having abdominal pain for over one month. [facility] but the patient is not happy with the information she was given and states they did not do an endometrial biopsy as she was told they would.

## 2014-04-23 NOTE — MAU Provider Note (Signed)
Attestation of Attending Supervision of Advanced Practitioner (PA/CNM/NP): Evaluation and management procedures were performed by the Advanced Practitioner under my supervision and collaboration.  I have reviewed the Advanced Practitioner's note and chart, and I agree with the management and plan.  Saifan Rayford, MD, FACOG Attending Obstetrician & Gynecologist Faculty Practice, [facility]  

## 2014-04-23 NOTE — Telephone Encounter (Signed)
Pt. Came to appointment.

## 2014-04-23 NOTE — Progress Notes (Signed)
Appointment made with [facility] for June 24 at 1015. Patient aware of appointment info. She asked for more pain medicines. I checked with Dr. Debroah LoopArnold and he stated that patient can't have any further refills on the pain medicine due to her pain not being related to her female organs. Patient will need to go to her PCP for pain issues until seen at GI.

## 2014-04-23 NOTE — MAU Provider Note (Signed)
Attestation of Attending Supervision of Advanced Practitioner (CNM/NP): Evaluation and management procedures were performed by the Advanced Practitioner under my supervision and collaboration. I have reviewed the Advanced Practitioner's note and chart, and I agree with the management and plan.  Lesly DukesKelly H Haleema Vanderheyden 6:17 PM

## 2014-04-23 NOTE — Telephone Encounter (Signed)
Pt. Given MAU urgent f/u slot for 1300 today. Attempted to call and inform pt. [facility] who [facility]; Pt. Not available. Message to be given to pt. To call clinic ASAP as we have some important information for her. Woman stated she would give that message to pt.

## 2014-04-23 NOTE — MAU Provider Note (Signed)
Annette Anderson is a 43 y.o. female who presents to MAU requesting pain medication until her visit next month with the GI doctor. She has had multiple visits for abdominal pain. She has had CT scan and ultrasound that were normal. Her last visit was to see Dr. Debroah LoopArnold today in the [facility] Clinic. He did her exam and pap smear. She requested pain medication. She had Rx for 10 percocet given to her yesterday. Dr. Debroah LoopArnold does not feel the source of her pain is GYN and did not give her additional pain medication. She is here with her sister and her sister says she must have pain medicine.  BP 154/93  Pulse 64  Temp(Src) 99.1 F (37.3 C) (Oral)  Resp 16  SpO2 100%  LMP 12/07/2013   I spoke with Dr. Penne LashLeggett and she reviewed the patient previous ER visits. The patient has had multiple visits for pain and treated with oxycodone on each visit and had Rx filled at different pharmacies. We will treat the patient's pain here today but I discussed with the patient that she will need to see her PCP for pain management until her follow up with GI. Patient voices understanding and agrees to plan.  Dilaudid 2 mg. IM, Phenergan 25 mg IM  After pain medication the patient is comfortable and stable for d/c to follow up with her PCP. I have reviewed this patient's vital signs, nurses notes, appropriate labs and imaging.

## 2014-04-23 NOTE — Discharge Instructions (Signed)
Your allergy list states that you are allergic to Naprosyn that you get hives. Your medication list states that you are taking ibuprofen which is also a nonsteroidal medication. If you are taking the ibuprofen watch for symptoms that you have with the Naprosyn.   We have treated your pain here today. You will need to follow up with your primary care provider for a pain management plan until you can see the GI doctor.  Abdominal Pain, Adult Many things can cause abdominal pain. Usually, abdominal pain is not caused by a disease and will improve without treatment. It can often be observed and treated at home. Your health care provider will do a physical exam and possibly order blood tests and X-rays to help determine the seriousness of your pain. However, in many cases, more time must pass before a clear cause of the pain can be found. Before that point, your health care provider may not know if you need more testing or further treatment. HOME CARE INSTRUCTIONS  Monitor your abdominal pain for any changes. The following actions may help to alleviate any discomfort you are experiencing:  Only take over-the-counter or prescription medicines as directed by your health care provider.  Do not take laxatives unless directed to do so by your health care provider.  Try a clear liquid diet (broth, tea, or water) as directed by your health care provider. Slowly move to a bland diet as tolerated. SEEK MEDICAL CARE IF:  You have unexplained abdominal pain.  You have abdominal pain associated with nausea or diarrhea.  You have pain when you urinate or have a bowel movement.  You experience abdominal pain that wakes you in the night.  You have abdominal pain that is worsened or improved by eating food.  You have abdominal pain that is worsened with eating fatty foods. SEEK IMMEDIATE MEDICAL CARE IF:   Your pain does not go away within 2 hours.  You have a fever.  You keep throwing up  (vomiting).  Your pain is felt only in portions of the abdomen, such as the right side or the left lower portion of the abdomen.  You pass bloody or black tarry stools. MAKE SURE YOU:  Understand these instructions.   Will watch your condition.   Will get help right away if you are not doing well or get worse.  Document Released: 09/20/2005 Document Revised: 10/01/2013 Document Reviewed: 08/20/2013 [facility] Patient Information 2014 [employer] [place].

## 2014-04-23 NOTE — Progress Notes (Signed)
Patient ID: Annette AlbeeBridget Siek, female   DOB: May 27, 1971, 43 y.o.   MRN: [id number]  Chief Complaint  Patient presents with  . Pelvic Pain  . Metrorrhagia    HPI Annette Anderson is a 43 y.o. female.  [id number] Patient's last menstrual period was 12/07/2013. Several months of RLQ and general abd pain with some nausea, diarrhea, was treated for PID in Jan no improvement.   HPI  Past Medical History  Diagnosis Date  . Thyroid disease   . Achalasia     Past Surgical History  Procedure Laterality Date  . Cesarean section      Family History  Problem Relation Age of Onset  . Hypertension Mother   . Hypertension Father   . Diabetes Father   . Thyroid disease Sister     radioactive ablation    Social History History  Substance Use Topics  . Smoking status: Current Some Day Smoker -- 0.10 packs/day    Types: Cigarettes  . Smokeless tobacco: Never Used  . Alcohol Use: Yes     Comment: occasional     Allergies  Allergen Reactions  . Bee Venom Anaphylaxis  . Sulfa Antibiotics Anaphylaxis and Hives  . Naproxen Hives    Current Outpatient Prescriptions  Medication Sig Dispense Refill  . albuterol (PROVENTIL HFA;VENTOLIN HFA) 108 (90 BASE) MCG/ACT inhaler Inhale 1-2 puffs into the lungs every 6 (six) hours as needed for wheezing or shortness of breath.  1 Inhaler  0  . calcium carbonate (TUMS EX) 750 MG chewable tablet Chew 2 tablets by mouth daily as needed for heartburn.      Marland Kitchen. ibuprofen (ADVIL,MOTRIN) 800 MG tablet Take 1 tablet (800 mg total) by mouth every 8 (eight) hours as needed.  90 tablet  0  . levothyroxine (SYNTHROID) 100 MCG tablet Take 1 tablet (100 mcg total) by mouth daily.  30 tablet  3  . metroNIDAZOLE (FLAGYL) 500 MG tablet Take 1 tablet (500 mg total) by mouth 2 (two) times daily.  14 tablet  0  . omeprazole (PRILOSEC) 20 MG capsule Take 20 mg by mouth daily.      . ondansetron (ZOFRAN) 4 MG tablet Take 1 tablet (4 mg total) by mouth every 6 (six) hours.  30  tablet  0  . oxyCODONE-acetaminophen (PERCOCET/ROXICET) 5-325 MG per tablet Take 1-2 tablets by mouth every 6 (six) hours as needed for severe pain.  10 tablet  0   No current facility-administered medications for this visit.    Review of Systems Review of Systems  Constitutional: Positive for fever.  Gastrointestinal: Positive for nausea, vomiting, abdominal pain and diarrhea. Negative for constipation and blood in stool.  Genitourinary: Positive for menstrual problem (menses Q 3 mo, sometimes heavy) and pelvic pain.    Blood pressure 139/86, pulse 73, temperature 98.3 F (36.8 C), temperature source Oral, height 5\' 1"  (1.549 m), weight 171 lb 14.4 oz (77.973 kg), last menstrual period 12/07/2013.  Physical Exam Physical Exam  Constitutional: She is oriented to person, place, and time. She appears well-developed. She appears distressed (mild discomfort).  Abdominal: Soft. Bowel sounds are normal. She exhibits no distension and no mass. There is tenderness. There is guarding (slight). There is no rebound.  Genitourinary: Vagina normal. No vaginal discharge found.  Pap done  Neurological: She is alert and oriented to person, place, and time.  Skin: Skin is warm and dry.  Psychiatric: She has a normal mood and affect. Her behavior is normal.    Data Reviewed US,  notes  Assessment    Abdominal pain and GI symptoms     Plan    GI referral, RTC internist RTC  6 months        Adam PhenixJames G Arnold 04/23/2014, 1:25 PM

## 2014-05-03 ENCOUNTER — Encounter (HOSPITAL_COMMUNITY): Payer: Self-pay | Admitting: Emergency Medicine

## 2014-05-03 ENCOUNTER — Emergency Department (HOSPITAL_COMMUNITY): Payer: BC Managed Care – PPO

## 2014-05-03 ENCOUNTER — Emergency Department (HOSPITAL_COMMUNITY)
Admission: EM | Admit: 2014-05-03 | Discharge: 2014-05-03 | Disposition: A | Discharge status: Home / Self Care | Payer: BC Managed Care – PPO | Attending: Emergency Medicine | Admitting: Emergency Medicine

## 2014-05-03 DIAGNOSIS — K220 Achalasia of cardia: Secondary | ICD-10-CM | POA: Insufficient documentation

## 2014-05-03 DIAGNOSIS — Z79899 Other long term (current) drug therapy: Secondary | ICD-10-CM | POA: Insufficient documentation

## 2014-05-03 DIAGNOSIS — F17200 Nicotine dependence, unspecified, uncomplicated: Secondary | ICD-10-CM | POA: Insufficient documentation

## 2014-05-03 DIAGNOSIS — E079 Disorder of thyroid, unspecified: Secondary | ICD-10-CM | POA: Insufficient documentation

## 2014-05-03 DIAGNOSIS — Z3202 Encounter for pregnancy test, result negative: Secondary | ICD-10-CM | POA: Insufficient documentation

## 2014-05-03 DIAGNOSIS — Z792 Long term (current) use of antibiotics: Secondary | ICD-10-CM | POA: Insufficient documentation

## 2014-05-03 DIAGNOSIS — R102 Pelvic and perineal pain: Secondary | ICD-10-CM

## 2014-05-03 DIAGNOSIS — Z91040 Latex allergy status: Secondary | ICD-10-CM | POA: Insufficient documentation

## 2014-05-03 DIAGNOSIS — N949 Unspecified condition associated with female genital organs and menstrual cycle: Secondary | ICD-10-CM | POA: Insufficient documentation

## 2014-05-03 LAB — COMPREHENSIVE METABOLIC PANEL
ALT: 9 U/L (ref 0–35)
AST: 15 U/L (ref 0–37)
Albumin: 3.6 g/dL (ref 3.5–5.2)
Alkaline Phosphatase: 86 U/L (ref 39–117)
BUN: 10 mg/dL (ref 6–23)
CALCIUM: 9.3 mg/dL (ref 8.4–10.5)
CO2: 24 meq/L (ref 19–32)
Chloride: 104 mEq/L (ref 96–112)
Creatinine, Ser: 0.64 mg/dL (ref 0.50–1.10)
GFR calc Af Amer: >90 mL/min (ref >90)
GFR calc non Af Amer: >90 mL/min (ref >90)
Glucose, Bld: 92 mg/dL (ref 70–99)
Potassium: 4 mEq/L (ref 3.7–5.3)
SODIUM: 142 meq/L (ref 137–147)
Total Bilirubin: <0.2 mg/dL — ABNORMAL LOW (ref 0.3–1.2)
Total Protein: 7.2 g/dL (ref 6.0–8.3)

## 2014-05-03 LAB — URINALYSIS, ROUTINE W REFLEX MICROSCOPIC
BILIRUBIN URINE: NEGATIVE
GLUCOSE, UA: NEGATIVE mg/dL
HGB URINE DIPSTICK: NEGATIVE
KETONES UR: NEGATIVE mg/dL
Leukocytes, UA: NEGATIVE
Nitrite: NEGATIVE
PROTEIN: NEGATIVE mg/dL
Specific Gravity, Urine: 1.023 (ref 1.005–1.030)
UROBILINOGEN UA: 0.2 mg/dL (ref 0.0–1.0)
pH: 5.5 (ref 5.0–8.0)

## 2014-05-03 LAB — LIPASE, BLOOD: Lipase: 47 U/L (ref 11–59)

## 2014-05-03 LAB — CBC WITH DIFFERENTIAL/PLATELET
Basophils Absolute: 0.1 10*3/uL (ref 0.0–0.1)
Basophils Relative: 1 % (ref 0–1)
EOS ABS: 0.3 10*3/uL (ref 0.0–0.7)
Eosinophils Relative: 4 % (ref 0–5)
HCT: 35.3 % — ABNORMAL LOW (ref 36.0–46.0)
Hemoglobin: 10.9 g/dL — ABNORMAL LOW (ref 12.0–15.0)
LYMPHS PCT: 34 % (ref 12–46)
Lymphs Abs: 2.8 10*3/uL (ref 0.7–4.0)
MCH: 24.4 pg — ABNORMAL LOW (ref 26.0–34.0)
MCHC: 30.9 g/dL (ref 30.0–36.0)
MCV: 79.1 fL (ref 78.0–100.0)
Monocytes Absolute: 0.5 10*3/uL (ref 0.1–1.0)
Monocytes Relative: 6 % (ref 3–12)
Neutro Abs: 4.5 10*3/uL (ref 1.7–7.7)
Neutrophils Relative %: 55 % (ref 43–77)
PLATELETS: 359 10*3/uL (ref 150–400)
RBC: 4.46 MIL/uL (ref 3.87–5.11)
RDW: 16.9 % — AB (ref 11.5–15.5)
WBC: 8.1 10*3/uL (ref 4.0–10.5)

## 2014-05-03 LAB — POC URINE PREG, ED: Preg Test, Ur: NEGATIVE

## 2014-05-03 MED ORDER — TRAMADOL HCL 50 MG PO TABS: 50.0000 mg | ORAL_TABLET | Freq: Four times a day (QID) | ORAL | Status: DC | PRN

## 2014-05-03 MED ORDER — OXYCODONE-ACETAMINOPHEN 5-325 MG PO TABS
2.0000 | ORAL_TABLET | Freq: Once | ORAL | Status: AC
  Administered 2014-05-03: 2 via ORAL
  Filled 2014-05-03: qty 2

## 2014-05-03 NOTE — ED Provider Notes (Signed)
CSN: [id number]     Arrival date & time 05/03/14  1931 History   First MD Initiated Contact with Patient 05/03/14 2044     Chief Complaint  Patient presents with  . Abdominal Pain  . Back Pain     (Consider location/radiation/quality/duration/timing/severity/associated sxs/prior Treatment) HPI Comments: 43 year old female with a history of possible ovarian cyst which was diagnosed in the last couple of months who presents with a complaint of lower pelvic pain and lower back pain. She states that she has increased pressure when she is urinating. She states that this started yesterday with just "twinges" of pain but today became much worse and it is almost persisted throughout the day. She denies any nausea or vomiting, she denies any diarrhea and has had no fevers or chills, no coughing, no rashes, no swelling, no vaginal discharge or bleeding. Her last menstrual period was approximately 2 weeks ago, before that it was in December. The patient does not have regular menstrual cycles. She states that she had a Pap smear 2 weeks ago, they did STD testing at that time, she declines testing today.  Patient is a 43 y.o. female presenting with abdominal pain and back pain. The history is provided by the patient and medical records.  Abdominal Pain Associated symptoms: no chest pain, no chills, no cough, no diarrhea, no dysuria, no fever, no nausea, no shortness of breath, no sore throat and no vomiting   Back Pain Associated symptoms: abdominal pain   Associated symptoms: no chest pain, no dysuria, no fever, no headaches, no numbness and no weakness     Past Medical History  Diagnosis Date  . Thyroid disease   . Achalasia    Past Surgical History  Procedure Laterality Date  . Cesarean section     Family History  Problem Relation Age of Onset  . Hypertension Mother   . Hypertension Father   . Diabetes Father   . Thyroid disease Sister     radioactive ablation   History  Substance Use  Topics  . Smoking status: Current Some Day Smoker -- 0.10 packs/day    Types: Cigarettes  . Smokeless tobacco: Never Used  . Alcohol Use: Yes     Comment: occasional    OB History   Grav Para Term Preterm Abortions TAB SAB Ect Mult Living   3 3 3  0 0 0 0 0 0 3     Review of Systems  Constitutional: Negative for fever and chills.  HENT: Negative for sore throat.   Eyes: Negative for visual disturbance.  Respiratory: Negative for cough and shortness of breath.   Cardiovascular: Negative for chest pain.  Gastrointestinal: Positive for abdominal pain. Negative for nausea, vomiting and diarrhea.  Genitourinary: Negative for dysuria and frequency.  Musculoskeletal: Positive for back pain. Negative for neck pain.  Skin: Negative for rash.  Neurological: Negative for weakness, numbness and headaches.  Hematological: Negative for adenopathy.  Psychiatric/Behavioral: Negative for behavioral problems.  All other systems reviewed and are negative.     Allergies  Bee venom; Sulfa antibiotics; Naproxen; and Latex  Home Medications   Prior to Admission medications   Medication Sig Start Date End Date Taking? Authorizing Provider  albuterol (PROVENTIL HFA;VENTOLIN HFA) 108 (90 BASE) MCG/ACT inhaler Inhale 1-2 puffs into the lungs every 6 (six) hours as needed for wheezing or shortness of breath. 03/23/14   Lauren Doretha Imus, PA-C  calcium carbonate (TUMS EX) 750 MG chewable tablet Chew 2 tablets by mouth daily as needed  for heartburn.    Historical Provider, MD  ibuprofen (ADVIL,MOTRIN) 800 MG tablet Take 1 tablet (800 mg total) by mouth every 8 (eight) hours as needed. 04/21/14   Annett Gularacy N McLean, MD  levothyroxine (SYNTHROID) 100 MCG tablet Take 1 tablet (100 mcg total) by mouth daily. 02/25/14 02/25/15  Judie BonusElizabeth A Kollar, MD  metroNIDAZOLE (FLAGYL) 500 MG tablet Take 1 tablet (500 mg total) by mouth 2 (two) times daily. 04/21/14   Annett Gularacy N McLean, MD  omeprazole (PRILOSEC) 20 MG capsule Take 20 mg  by mouth daily.    Historical Provider, MD  ondansetron (ZOFRAN) 4 MG tablet Take 1 tablet (4 mg total) by mouth every 6 (six) hours. 04/21/14   Annett Gularacy N McLean, MD  oxyCODONE-acetaminophen (PERCOCET/ROXICET) 5-325 MG per tablet Take 1-2 tablets by mouth every 6 (six) hours as needed for severe pain. 04/22/14   Freddi StarrJulie N Ethier, PA-C   BP 157/97  Pulse 73  Temp(Src) 98.4 F (36.9 C) (Oral)  Resp 16  Ht 5\' 2"  (1.575 m)  Wt 173 lb (78.472 kg)  BMI 31.63 kg/m2  SpO2 96%  LMP 04/19/2014 Physical Exam  Nursing note and vitals reviewed. Constitutional: She appears well-developed and well-nourished. No distress.  HENT:  Head: Normocephalic and atraumatic.  Mouth/Throat: Oropharynx is clear and moist. No oropharyngeal exudate.  Eyes: Conjunctivae and EOM are normal. Pupils are equal, round, and reactive to light. Right eye exhibits no discharge. Left eye exhibits no discharge. No scleral icterus.  Neck: Normal range of motion. Neck supple. No JVD present. No thyromegaly present.  Cardiovascular: Normal rate, regular rhythm, normal heart sounds and intact distal pulses.  Exam reveals no gallop and no friction rub.   No murmur heard. Pulmonary/Chest: Effort normal and breath sounds normal. No respiratory distress. She has no wheezes. She has no rales.  Abdominal: Soft. Bowel sounds are normal. She exhibits no distension and no mass. There is tenderness ( Bilateral lower abdominal tenderness with mild guarding. Mild upper abdominal tenderness but no guarding in the upper abdomen. No masses, no peritoneal signs). There is guarding.  Genitourinary:  Minimal adnexal and cervical tenderness, no discharge, no bleeding, no foul odors, no foreign bodies. Chaperone present for exam  Musculoskeletal: Normal range of motion. She exhibits no edema and no tenderness.  Lymphadenopathy:    She has no cervical adenopathy.  Neurological: She is alert. Coordination normal.  Skin: Skin is warm and dry. No rash noted.  No erythema.  Psychiatric: She has a normal mood and affect. Her behavior is normal.    ED Course  Procedures (including critical care time) Labs Review Labs Reviewed  CBC WITH DIFFERENTIAL - Abnormal; Notable for the following:    Hemoglobin 10.9 (*)    HCT 35.3 (*)    MCH 24.4 (*)    RDW 16.9 (*)    All other components within normal limits  COMPREHENSIVE METABOLIC PANEL - Abnormal; Notable for the following:    Total Bilirubin <0.2 (*)    All other components within normal limits  LIPASE, BLOOD  URINALYSIS, ROUTINE W REFLEX MICROSCOPIC  POC URINE PREG, ED    Imaging Review [place] Transvaginal Non-ob  05/03/2014   CLINICAL DATA:  Right lower quadrant and pelvic pain for 1 month.  EXAM: TRANSABDOMINAL AND TRANSVAGINAL ULTRASOUND OF PELVIS  TECHNIQUE: Both transabdominal and transvaginal ultrasound examinations of the pelvis were performed. Transabdominal technique was performed for global imaging of the pelvis including uterus, ovaries, adnexal regions, and pelvic cul-de-sac. It was necessary to  proceed with endovaginal exam following the transabdominal exam to visualize the endometrium and ovaries.  COMPARISON:  CT ABD/PELVIS W CM dated 04/18/2014; US PELVIS COMPLETE dated 04/14/2014  FINDINGS: Uterus  Measurements: 10.1 x 4.5 x 5.9 cm, anteverted. Heterogeneous appearance of parenchymal echotexture. Myometrial fibroids are not excluded.  Endometrium  Thickness: 11.1 mm. Fluid within the endocervical canal with and ovoid soft tissue structure in the endocervical region measuring about 5 x 15 mm. No flow is demonstrated in the structure on color flow Doppler imaging. This could represent blood clot, polyp, or other debris.  Right ovary  Measurements: 2.3 x 1.6 x 1.6 cm. Normal appearance/no adnexal mass.  Left ovary  Measurements: 2.8 x 2.3 x 2.4 cm. Circumscribed cystic structure measuring about 1.9 cm maximal diameter. No floor septations. This is consistent with a functional cyst.  Other  findings  Small amount of free fluid in the pelvis.  IMPRESSION: Fluid and irregular polypoid structure in the endocervical canal without flow demonstrated. This could represent polyp, blood clot, or other debris. Heterogeneous uterine parenchymal echotexture may indicate fibroids. The ovaries are unremarkable.   Electronically Signed   By: Burman NievesWilliam  Stevens M.D.   On: 05/03/2014 22:29   [place] Pelvis Complete  05/03/2014   CLINICAL DATA:  Right lower quadrant and pelvic pain for 1 month.  EXAM: TRANSABDOMINAL AND TRANSVAGINAL ULTRASOUND OF PELVIS  TECHNIQUE: Both transabdominal and transvaginal ultrasound examinations of the pelvis were performed. Transabdominal technique was performed for global imaging of the pelvis including uterus, ovaries, adnexal regions, and pelvic cul-de-sac. It was necessary to proceed with endovaginal exam following the transabdominal exam to visualize the endometrium and ovaries.  COMPARISON:  CT ABD/PELVIS W CM dated 04/18/2014; US PELVIS COMPLETE dated 04/14/2014  FINDINGS: Uterus  Measurements: 10.1 x 4.5 x 5.9 cm, anteverted. Heterogeneous appearance of parenchymal echotexture. Myometrial fibroids are not excluded.  Endometrium  Thickness: 11.1 mm. Fluid within the endocervical canal with and ovoid soft tissue structure in the endocervical region measuring about 5 x 15 mm. No flow is demonstrated in the structure on color flow Doppler imaging. This could represent blood clot, polyp, or other debris.  Right ovary  Measurements: 2.3 x 1.6 x 1.6 cm. Normal appearance/no adnexal mass.  Left ovary  Measurements: 2.8 x 2.3 x 2.4 cm. Circumscribed cystic structure measuring about 1.9 cm maximal diameter. No floor septations. This is consistent with a functional cyst.  Other findings  Small amount of free fluid in the pelvis.  IMPRESSION: Fluid and irregular polypoid structure in the endocervical canal without flow demonstrated. This could represent polyp, blood clot, or other debris.  Heterogeneous uterine parenchymal echotexture may indicate fibroids. The ovaries are unremarkable.   Electronically Signed   By: Burman NievesWilliam  Stevens M.D.   On: 05/03/2014 22:29      MDM   Final diagnoses:  Pelvic pain    The patient has vital signs which are unremarkable, she has mild hypertension but no signs of seizure or tachycardia. We'll perform a pelvic exam and get an ultrasound of her abdomen and pelvis. She states that she had a cyst that ruptured in the past, she may have recurrent symptoms similar to that. Review of the medical records shows that the patient had a medical visit to the emergency department where she came from the urgent care with lower abdominal pain. This was approximately 2 weeks ago. According to that visit she had a CT scan of the abdomen and pelvis that showed a small amount of fluid  in the right adnexa, no definite other source of patient's symptoms were seen at that time.  Pt has improved with meds, ultrasound shows possible fibroid uterus but no other significant findings, some fluid in the cervical canal. The patient appears stable for discharge to followup as an outpatient. This was reviewed with the patient. Labs unremarkable. She'll be discharged with the following medications.   Meds given in ED:  Medications  oxyCODONE-acetaminophen (PERCOCET/ROXICET) 5-325 MG per tablet 2 tablet (2 tablets Oral Given 05/03/14 2101)    New Prescriptions   TRAMADOL (ULTRAM) 50 MG TABLET    Take 1 tablet (50 mg total) by mouth every 6 (six) hours as needed.      Vida Roller, MD 05/03/14 2239

## 2014-05-03 NOTE — ED Notes (Signed)
C/o pelvic pain and bilateral flank pain since last night.  Also reports nausea and difficulty urinating.

## 2014-05-03 NOTE — Discharge Instructions (Signed)
Please call your doctor for a followup appointment within 24-48 hours. When you talk to your doctor please let them know that you were seen in the emergency department and have them acquire all of your records so that they can discuss the findings with you and formulate a treatment plan to fully care for your new and ongoing problems. ° ° °Emergency Department Resource Guide °1) Find a Doctor and Pay Out of Pocket °Although you won't have to find out who is covered by your insurance plan, it is a good idea to ask around and get recommendations. You will then need to call the office and see if the doctor you have chosen will accept you as a new patient and what types of options they offer for patients who are self-pay. Some doctors offer discounts or will set up payment plans for their patients who do not have insurance, but you will need to ask so you aren't surprised when you get to your appointment. ° °2) Contact Your Local Health Department °Not all health departments have doctors that can see patients for sick visits, but many do, so it is worth a call to see if yours does. If you don't know where your local health department is, you can check in your phone book. The CDC also has a tool to help you locate your state's health department, and many state websites also have listings of all of their local health departments. ° °3) Find a Walk-in Clinic °If your illness is not likely to be very severe or complicated, you may want to try a walk in clinic. These are popping up all over the country in pharmacies, drugstores, and shopping centers. They're usually staffed by nurse practitioners or physician assistants that have been trained to treat common illnesses and complaints. They're usually fairly quick and inexpensive. However, if you have serious medical issues or chronic medical problems, these are probably not your best option. ° °No Primary Care Doctor: °- Call [facility] at  832-8000 - they can help you  locate a primary care doctor that  accepts your insurance, provides certain services, etc. °- Physician Referral Service- 1-800-533-3463 ° °Chronic Pain Problems: °Organization         Address  Phone   Notes  °[facility]  (336) 297-2271 Patients need to be referred by their primary care doctor.  ° °Medication Assistance: °Organization         Address  Phone   Notes  °[place] [facility] [address] °[place], [place] [address] (336) 641-8030 --Must be a resident of [place] °-- Must have NO insurance coverage whatsoever (no Medicaid/ Medicare, etc.) °-- The pt. MUST have a primary care doctor that directs their care regularly and follows them in the community °  °[facility]  (866) 331-1348   °[employer]  (888) 892-1162   ° °Agencies that provide inexpensive medical care: °Organization         Address  Phone   Notes  °[facility]  (336) 832-8035   °[facility]    (336) 832-7272   °[facility] [address] °[place], [place] [address] (336) 832-4777   °[facility] [address], °[place] (336) 271-4999   °[facility]    (336) 373-0678   °[facility]    (336) 272-1050   °[facility] ° [address], [place] Phone:  (336)   832-4444, Fax:  (336) 832-4440 Hours of Operation:  9 am - 6 pm, M-F.  Also accepts Medicaid/Medicare and self-pay.  °[facility] ° [address], [place] Phone: (336) 832-3150, Fax: (336) 832-3151. Hours of Operation:  8:30 am - 5:30 pm, M-F.  Also accepts Medicaid and self-pay.  °[facility] [place] [address], [place] Phone: (336) 878-6027   °[facility] [address], [place], [place] (336)723-1848, Ext. 123 Mondays & Thursdays: 7-9 AM.  First 15 patients are seen on a first come, first serve basis. °  ° °Medicaid-accepting [place]  Providers: ° °Organization         Address  Phone   Notes  °[facility] [address], [place] (336) 641-2100 Also accepts self-pay patients.  °[facility] [address], [place] ° (336) 856-9996   °[facility] [address], [place] (336) 288-8857   °[facility] [address] [place] [address], [place] (336) 299-7000   °Veita Bland [address], [place]  ° (336) 373-1557 Only accepts [place] Access Medicaid patients after they have their name applied to their card.  ° °Self-Pay (no insurance) in [place]: ° °Organization         Address  Phone   Notes  °Sickle Cell Patients, [facility] [address], [place] (336) 832-1970   °[facility] [address], [place] (336) 832-4400   °[facility] ° [address], [place] (336) 992-4800   °[facility]/Dr. Osei-Bonsu ° [address] [place] [address], [place] or [address], [place] (336) 841-8500 Phone number for both [place] and [place] locations is the same.  °[facility] [address], [place] (336) 299-0000   °[facility] [address] [place] [address], [place] or [address] (336) 852-7530 °(336) 878-2260   °[facility] [address], [place] (336) 350-1642, phone; (336) 294-5005, fax Sees patients 1st and 3rd Saturday of every month.  Must not qualify for public or private insurance (i.e. Medicaid, Medicare, [place] Health Choice, Veterans' Benefits) • Household income should be no more than 200% of the poverty level •The clinic cannot treat you if you are pregnant or think you are pregnant • Sexually transmitted diseases are not treated at the clinic.  ° ° °Dental Care: °Organization         Address  Phone  Notes  °[facility] [address], [place] (336) 641-6152 Accepts children up to age 21 who are enrolled in Medicaid or [place] Health Choice; pregnant women with a Medicaid card; and children who have applied for Medicaid or [place] Health Choice, but were declined, whose parents can pay a reduced fee at time of service.  °[facility] [place]  [address], [place] (336) 641-7733 Accepts children up to age 21 who are enrolled in Medicaid or [place] Health Choice; pregnant women with a Medicaid card; and children who have applied for Medicaid or [place] Health Choice, but were declined, whose parents can pay a reduced fee at time of service.  °[facility] ° [address],  (336) 641-4533 Patients are seen by appointment only. Walk-ins are   not accepted. [facility] will see patients 18 years of age and older. °Monday - Tuesday (8am-5pm) °Most Wednesdays (8:30-5pm) °$30 per visit, cash only  °[facility] ° [address], [place] (336) 641-4533 Patients are seen by appointment only. Walk-ins are not accepted. [facility] will see patients 18 years of age and older. °One Wednesday Evening (Monthly: Volunteer Based).  $30 per visit, cash only  °[facility]  (919) 537-3737 for adults; Children under age 4, call Graduate Pediatric Dentistry at (919) [phone]. Children aged 4-14, please call (919) [phone] to request a pediatric application. ° Dental services are provided in all areas of dental care including fillings, crowns and bridges, complete and partial dentures, implants, gum treatment, root canals, and extractions. Preventive care is also provided. Treatment is provided to both adults and children. °Patients are selected via a lottery and there is often a waiting list. °  °[facility] [address], °[place] ° (336) 763-8833 [email or web address] °  °[facility]  [address], [place], [place] (336)723-1848, Ext. 123 Second and Fourth Thursday of each month, opens at 6:30 AM; Clinic ends at 9 AM.  Patients are seen on a first-come first-served basis, and a limited number are seen during each clinic.  ° °[facility] ° [address], [place], [place] (336) 723-7904   Eligibility Requirements °You must have lived in [place], or [place] counties for at least the last three months. °  You cannot be eligible for state or federal sponsored healthcare insurance, including Veterans Administration, Medicaid, or Medicare. °  You generally cannot be eligible for healthcare insurance through your employer.  °  How to apply: °Eligibility screenings are held every Tuesday and Wednesday afternoon from 1:00 pm until 4:00 pm. You do not need an appointment for the interview!  °[facility] [address], [place], [place] 336-631-2330   °[facility]  336-342-8273   °[facility]  336-703-3100   °[facility]  336-570-6415   ° °Behavioral Health Resources in the Community: °Intensive Outpatient Programs °Organization         Address  Phone  Notes  °[facility] [address], [place], [place] 336-878-6098   °[facility] [address], [place], [place] 336-832-9800   °[facility] [address], [place], [place] ° 336-882-2125   °[facility] [address],  °[place], [place] 1-800-853-5163 or 336-641-4981   °Substance Abuse Resources °Organization         Address  Phone  Notes  °[facility]  336-882-2125   °[facility]  336-784-9470   °[facility]  336-285-9073   °[facility]  336-845-3988   °Residential & Outpatient Substance Abuse Program  1-800-659-3381   °Psychological Services °Organization         Address  Phone  Notes  °[facility]  336- 832-9600     °[facility]  336- 378-7881   °[facility] [address], [place] 1-800-853-5163 or 336-641-4981   ° °Mobile Crisis Teams °Organization         Address  Phone  Notes  °[facility], Mobile Crisis Care Unit  1-877-626-1772   °Assertive °[facility] ° [address] [place],  336-834-9664   °Sharon DeEsch [address] °[place]  336-554-5454   ° °  Self-Help/Support Groups °Organization         Address  Phone             Notes  °[facility] - variety of support groups  336- 373-1402 Call for more information  °Narcotics Anonymous (NA), [facility] [address], °[place]  2 meetings at this location  ° °Residential Treatment Programs °Organization         Address  Phone  Notes  °[facility] [address],    °[place]  1-866-801-8205   °[facility] ° [address], [place], [place] 704-293-8524   °[facility] [address], [place] 336-845-3988 Admissions: 8am-3pm M-F  °[facility] [address]    °[place], [place] 336-841-1104   °[facility] [address], [place], [place] 336-379-7146   °[facility] [address]  °[place], [place] 336-285-9073   °[facility] - Intensive Outpatient [address], [place], [place] 336-852-3033   °[facility] [address]  °[place], [place] 1-877-615-2722 or 336-784-9470   °[facility] [address] [place], [place] 336-227-7417 Accepts Medicaid  °[facility] [address]  ° [place] 1-800-659-3381 Substance Abuse/Addiction Treatment  ° °[place] Behavioral Health Resources °Organization         Address  Phone  Notes  °[facility]  (888) 581-9988   °Julie Brannon, PhD [address] [place], [place]   (336) 349-5553 or (336) 951-0000    °[facility]   [address] °[place], [place] (336) 349-4454   °[facility] [address], [place], [place] (336) 342-8316 Insurance/Medicaid/sponsorship through [facility]  °[facility] [address]                                    [place], [place] (336) 342-8316 Therapy/tele-psych/case  °[facility] [address]  ° [place],  (336) 349-2233    °Dr. Arfeen  (336) 349-4544   °[facility] [place]  [employer] [place] [facility] 1) [address], [place] °2) [address], [place] °3)  [address], [place] (336) 349-3220 °(336) 342-7768 ° °(336) 342-8140   °[place] [facility] (336) 342-1394 or (336) 342-3537 (After Hours)    ° ° ° °

## 2014-05-04 ENCOUNTER — Inpatient Hospital Stay (HOSPITAL_COMMUNITY)
Admission: AD | Admit: 2014-05-04 | Discharge: 2014-05-04 | Disposition: A | Discharge status: Home / Self Care | Payer: BC Managed Care – PPO | Source: Ambulatory Visit | Attending: Obstetrics & Gynecology | Admitting: Obstetrics & Gynecology

## 2014-05-04 ENCOUNTER — Encounter (HOSPITAL_COMMUNITY): Payer: Self-pay | Admitting: *Deleted

## 2014-05-04 DIAGNOSIS — F1910 Other psychoactive substance abuse, uncomplicated: Secondary | ICD-10-CM | POA: Insufficient documentation

## 2014-05-04 DIAGNOSIS — K219 Gastro-esophageal reflux disease without esophagitis: Secondary | ICD-10-CM | POA: Insufficient documentation

## 2014-05-04 DIAGNOSIS — F17200 Nicotine dependence, unspecified, uncomplicated: Secondary | ICD-10-CM | POA: Insufficient documentation

## 2014-05-04 DIAGNOSIS — R102 Pelvic and perineal pain: Secondary | ICD-10-CM

## 2014-05-04 DIAGNOSIS — Z765 Malingerer [conscious simulation]: Secondary | ICD-10-CM | POA: Insufficient documentation

## 2014-05-04 DIAGNOSIS — G8929 Other chronic pain: Secondary | ICD-10-CM

## 2014-05-04 DIAGNOSIS — R1032 Left lower quadrant pain: Secondary | ICD-10-CM | POA: Insufficient documentation

## 2014-05-04 DIAGNOSIS — N949 Unspecified condition associated with female genital organs and menstrual cycle: Secondary | ICD-10-CM

## 2014-05-04 HISTORY — DX: Anemia, unspecified: D64.9

## 2014-05-04 HISTORY — DX: Gastro-esophageal reflux disease without esophagitis: K21.9

## 2014-05-04 HISTORY — DX: Unspecified abdominal pain: R10.9

## 2014-05-04 NOTE — Progress Notes (Signed)
Blanche EastJ. Rasch, NP discussed patient concerns. Patient ready to leave after that. Was standing in doorway. Would not allow VS's, refused to sign e-sig. Unable to re-assess pain.

## 2014-05-04 NOTE — MAU Note (Signed)
Urine in lab 

## 2014-05-04 NOTE — MAU Provider Note (Signed)
Attestation of Attending Supervision of Advanced Practitioner (PA/CNM/NP): Evaluation and management procedures were performed by the Advanced Practitioner under my supervision and collaboration.  I have reviewed the Advanced Practitioner's note and chart, and I agree with the management and plan.  Reva Boresanya S Pratt, MD [facility] Attending 05/04/2014 3:47 PM

## 2014-05-04 NOTE — MAU Note (Signed)
[facility] for [facility] pain. States she continues to have abdominal pain and [facility]. Denies bleeding and has a clear discharge.

## 2014-05-04 NOTE — MAU Provider Note (Signed)
History     CSN: [id number]  Arrival date and time: 05/04/14 1246   None     Chief Complaint  Patient presents with  . Abdominal Pain   HPI  [name] is a 43 y.o. female [phone] who presents with ongoing abdominal pain. The patient has been seen several times in several different ER's with complaints of similar pain complaints. She was last seen at [facility] ED yesterday; 05/03/14. According to the [place] drug database the patient was given 15 tramadol yesterday in the Emergency room, prior to that on 4/29 she was given #10 percocet.  Pt is scheduled for an appointment in the clinic this month; she saw Dr. Roselie Anderson on 4/30 who informed her that this pain is not GYN in nature and recommended she see a GI Dr. Abbott Anderson states her pain is now in her LLQ and she is now having discharge. I offered her a pelvic exam to address her new onset discharge; pt declined. She states she had an exam done yesterday and she does not want any exams.  The patient requests a list of local GYN providers in the area and plans to try a "new Dr". The patient is scheduled to see a GI Dr. In June.    OB History   Grav Para Term Preterm Abortions TAB SAB Ect Mult Living   '3 3 3 ' 0 0 0 0 0 0 3      Past Medical History  Diagnosis Date  . Thyroid disease   . Achalasia   . GERD (gastroesophageal reflux disease)   . Anemia   . Abdominal pain     Past Surgical History  Procedure Laterality Date  . Cesarean section      Family History  Problem Relation Age of Onset  . Hypertension Mother   . Hypertension Father   . Diabetes Father   . Thyroid disease Sister     radioactive ablation    History  Substance Use Topics  . Smoking status: Current Some Day Smoker -- 0.10 packs/day    Types: Cigarettes  . Smokeless tobacco: Never Used  . Alcohol Use: Yes     Comment: occasional     Allergies:  Allergies  Allergen Reactions  . Bee Venom Anaphylaxis  . Sulfa Antibiotics Anaphylaxis and Hives  .  Naproxen Hives  . Latex Rash    Prescriptions prior to admission  Medication Sig Dispense Refill  . calcium carbonate (TUMS EX) 750 MG chewable tablet Chew 2 tablets by mouth daily as needed for heartburn.      . diphenhydrAMINE (BENADRYL) 25 mg capsule Take 25 mg by mouth every 6 (six) hours as needed for allergies.      Marland Kitchen ibuprofen (ADVIL,MOTRIN) 800 MG tablet Take 1 tablet (800 mg total) by mouth every 8 (eight) hours as needed.  90 tablet  0  . levothyroxine (SYNTHROID) 100 MCG tablet Take 1 tablet (100 mcg total) by mouth daily.  30 tablet  3  . omeprazole (PRILOSEC) 20 MG capsule Take 20 mg by mouth daily.      . ondansetron (ZOFRAN) 4 MG tablet Take 1 tablet (4 mg total) by mouth every 6 (six) hours.  30 tablet  0  . oxyCODONE-acetaminophen (PERCOCET/ROXICET) 5-325 MG per tablet Take 1-2 tablets by mouth every 6 (six) hours as needed for severe pain.  10 tablet  0  . albuterol (PROVENTIL HFA;VENTOLIN HFA) 108 (90 BASE) MCG/ACT inhaler Inhale 1-2 puffs into the lungs every 6 (six)  hours as needed for wheezing or shortness of breath.  1 Inhaler  0  . metroNIDAZOLE (FLAGYL) 500 MG tablet Take 1 tablet (500 mg total) by mouth 2 (two) times daily.  14 tablet  0  . traMADol (ULTRAM) 50 MG tablet Take 1 tablet (50 mg total) by mouth every 6 (six) hours as needed.  15 tablet  0   Results for orders placed during the hospital encounter of 05/03/14 (from the past 48 hour(s))  URINALYSIS, ROUTINE W REFLEX MICROSCOPIC     Status: None   Collection Time    05/03/14  7:45 PM      Result Value Ref Range   Color, Urine YELLOW  YELLOW   APPearance CLEAR  CLEAR   Specific Gravity, Urine 1.023  1.005 - 1.030   pH 5.5  5.0 - 8.0   Glucose, UA NEGATIVE  NEGATIVE mg/dL   Hgb urine dipstick NEGATIVE  NEGATIVE   Bilirubin Urine NEGATIVE  NEGATIVE   Ketones, ur NEGATIVE  NEGATIVE mg/dL   Protein, ur NEGATIVE  NEGATIVE mg/dL   Urobilinogen, UA 0.2  0.0 - 1.0 mg/dL   Nitrite NEGATIVE  NEGATIVE    Leukocytes, UA NEGATIVE  NEGATIVE   Comment: MICROSCOPIC NOT DONE ON URINES WITH NEGATIVE PROTEIN, BLOOD, LEUKOCYTES, NITRITE, OR GLUCOSE <1000 mg/dL.  CBC WITH DIFFERENTIAL     Status: Abnormal   Collection Time    05/03/14  7:51 PM      Result Value Ref Range   WBC 8.1  4.0 - 10.5 K/uL   RBC 4.46  3.87 - 5.11 MIL/uL   Hemoglobin 10.9 (*) 12.0 - 15.0 g/dL   HCT 35.3 (*) 36.0 - 46.0 %   MCV 79.1  78.0 - 100.0 fL   MCH 24.4 (*) 26.0 - 34.0 pg   MCHC 30.9  30.0 - 36.0 g/dL   RDW 16.9 (*) 11.5 - 15.5 %   Platelets 359  150 - 400 K/uL   Neutrophils Relative % 55  43 - 77 %   Neutro Abs 4.5  1.7 - 7.7 K/uL   Lymphocytes Relative 34  12 - 46 %   Lymphs Abs 2.8  0.7 - 4.0 K/uL   Monocytes Relative 6  3 - 12 %   Monocytes Absolute 0.5  0.1 - 1.0 K/uL   Eosinophils Relative 4  0 - 5 %   Eosinophils Absolute 0.3  0.0 - 0.7 K/uL   Basophils Relative 1  0 - 1 %   Basophils Absolute 0.1  0.0 - 0.1 K/uL  COMPREHENSIVE METABOLIC PANEL     Status: Abnormal   Collection Time    05/03/14  7:51 PM      Result Value Ref Range   Sodium 142  137 - 147 mEq/L   Potassium 4.0  3.7 - 5.3 mEq/L   Chloride 104  96 - 112 mEq/L   CO2 24  19 - 32 mEq/L   Glucose, Bld 92  70 - 99 mg/dL   BUN 10  6 - 23 mg/dL   Creatinine, Ser 0.64  0.50 - 1.10 mg/dL   Calcium 9.3  8.4 - 10.5 mg/dL   Total Protein 7.2  6.0 - 8.3 g/dL   Albumin 3.6  3.5 - 5.2 g/dL   AST 15  0 - 37 U/L   ALT 9  0 - 35 U/L   Alkaline Phosphatase 86  39 - 117 U/L   Total Bilirubin <0.2 (*) 0.3 - 1.2 mg/dL  GFR calc non Af Amer >90  >90 mL/min   GFR calc Af Amer >90  >90 mL/min   Comment: (NOTE)     The eGFR has been calculated using the CKD EPI equation.     This calculation has not been validated in all clinical situations.     eGFR's persistently <90 mL/min signify possible Chronic Kidney     Disease.  LIPASE, BLOOD     Status: None   Collection Time    05/03/14  7:51 PM      Result Value Ref Range   Lipase 47  11 - 59 U/L  POC  URINE PREG, ED     Status: None   Collection Time    05/03/14  7:56 PM      Result Value Ref Range   Preg Test, Ur NEGATIVE  NEGATIVE   Comment:            THE SENSITIVITY OF THIS     METHODOLOGY IS >24 mIU/mL   US Transvaginal Non-ob  05/03/2014   CLINICAL DATA:  Right lower quadrant and pelvic pain for 1 month.  EXAM: TRANSABDOMINAL AND TRANSVAGINAL ULTRASOUND OF PELVIS  TECHNIQUE: Both transabdominal and transvaginal ultrasound examinations of the pelvis were performed. Transabdominal technique was performed for global imaging of the pelvis including uterus, ovaries, adnexal regions, and pelvic cul-de-sac. It was necessary to proceed with endovaginal exam following the transabdominal exam to visualize the endometrium and ovaries.  COMPARISON:  CT ABD/PELVIS W CM dated 04/18/2014; US PELVIS COMPLETE dated 04/14/2014  FINDINGS: Uterus  Measurements: 10.1 x 4.5 x 5.9 cm, anteverted. Heterogeneous appearance of parenchymal echotexture. Myometrial fibroids are not excluded.  Endometrium  Thickness: 11.1 mm. Fluid within the endocervical canal with and ovoid soft tissue structure in the endocervical region measuring about 5 x 15 mm. No flow is demonstrated in the structure on color flow Doppler imaging. This could represent blood clot, polyp, or other debris.  Right ovary  Measurements: 2.3 x 1.6 x 1.6 cm. Normal appearance/no adnexal mass.  Left ovary  Measurements: 2.8 x 2.3 x 2.4 cm. Circumscribed cystic structure measuring about 1.9 cm maximal diameter. No floor septations. This is consistent with a functional cyst.  Other findings  Small amount of free fluid in the pelvis.  IMPRESSION: Fluid and irregular polypoid structure in the endocervical canal without flow demonstrated. This could represent polyp, blood clot, or other debris. Heterogeneous uterine parenchymal echotexture may indicate fibroids. The ovaries are unremarkable.   Electronically Signed   By: Lucienne Capers M.D.   On: 05/03/2014 22:29    US Pelvis Complete  05/03/2014   CLINICAL DATA:  Right lower quadrant and pelvic pain for 1 month.  EXAM: TRANSABDOMINAL AND TRANSVAGINAL ULTRASOUND OF PELVIS  TECHNIQUE: Both transabdominal and transvaginal ultrasound examinations of the pelvis were performed. Transabdominal technique was performed for global imaging of the pelvis including uterus, ovaries, adnexal regions, and pelvic cul-de-sac. It was necessary to proceed with endovaginal exam following the transabdominal exam to visualize the endometrium and ovaries.  COMPARISON:  CT ABD/PELVIS W CM dated 04/18/2014; US PELVIS COMPLETE dated 04/14/2014  FINDINGS: Uterus  Measurements: 10.1 x 4.5 x 5.9 cm, anteverted. Heterogeneous appearance of parenchymal echotexture. Myometrial fibroids are not excluded.  Endometrium  Thickness: 11.1 mm. Fluid within the endocervical canal with and ovoid soft tissue structure in the endocervical region measuring about 5 x 15 mm. No flow is demonstrated in the structure on color flow Doppler imaging. This could represent blood clot,  polyp, or other debris.  Right ovary  Measurements: 2.3 x 1.6 x 1.6 cm. Normal appearance/no adnexal mass.  Left ovary  Measurements: 2.8 x 2.3 x 2.4 cm. Circumscribed cystic structure measuring about 1.9 cm maximal diameter. No floor septations. This is consistent with a functional cyst.  Other findings  Small amount of free fluid in the pelvis.  IMPRESSION: Fluid and irregular polypoid structure in the endocervical canal without flow demonstrated. This could represent polyp, blood clot, or other debris. Heterogeneous uterine parenchymal echotexture may indicate fibroids. The ovaries are unremarkable.   Electronically Signed   By: Lucienne Capers M.D.   On: 05/03/2014 22:29    Review of Systems  Constitutional: Negative for fever and chills.  Gastrointestinal: Positive for abdominal pain (+ LLQ pain).  Genitourinary: Negative for dysuria, urgency, frequency and hematuria.       + vaginal  discharge. No vaginal bleeding. No dysuria.    Physical Exam   Blood pressure 151/83, pulse 67, temperature 98.7 F (37.1 C), temperature source Oral, resp. rate 16, height '5\' 2"'  (1.575 m), weight 78.382 kg (172 lb 12.8 oz), last menstrual period 04/19/2014, SpO2 100.00%.  Physical Exam  Constitutional: She is oriented to person, place, and time. She appears well-developed and well-nourished.  Non-toxic appearance. She does not have a sickly appearance. She does not appear ill. No distress.  HENT:  Head: Normocephalic.  Eyes: Pupils are equal, round, and reactive to light.  Neck: Neck supple.  Respiratory: Effort normal.  GI:  Pt declined an exam  Musculoskeletal: Normal range of motion.  Neurological: She is alert and oriented to person, place, and time.  Skin: She is not diaphoretic.  Psychiatric: Her mood appears anxious. Her speech is rapid and/or pressured. She is hyperactive. She expresses impulsivity.    MAU Course  Procedures None  MDM Given the patient had a full abdominal work up in the ER yesterday, I do not feel at this time it is necessary to perform any other imaging. I offered a pelvic exam to address her discharge and the patient declined an exam.  A list of local GYN providers given to the patient. I declined to give the patient further narcotic RX.   Assessment and Plan   A:  1. Chronic pelvic pain in female   2.   Drug seeking behavior   P:  Discharge to home in stable condition Discussed the importance of PCP Keep appointment with GI Dr. In June.  Take pain medication as prescribed by ED  Annette Hillock Bently Morath, NP 05/04/2014, 2:34 PM

## 2014-05-13 ENCOUNTER — Telehealth: Payer: Self-pay | Admitting: *Deleted

## 2014-05-13 ENCOUNTER — Ambulatory Visit: Payer: BC Managed Care – PPO | Admitting: Advanced Practice Midwife

## 2014-05-13 NOTE — Telephone Encounter (Signed)
Annette GougeBridget missed an appointment today for outpatient clinics for pelvic pain as referred by MAU and ER.  Called [name] mobile number- unable to leave message. Called home number left message she missed an appointment , please call back to reschedule.

## 2014-06-17 ENCOUNTER — Ambulatory Visit: Payer: BC Managed Care – PPO | Admitting: Gastroenterology

## 2014-06-26 ENCOUNTER — Other Ambulatory Visit (HOSPITAL_COMMUNITY): Payer: Self-pay | Admitting: Internal Medicine

## 2014-07-08 ENCOUNTER — Emergency Department (HOSPITAL_COMMUNITY): Payer: BC Managed Care – PPO

## 2014-07-08 ENCOUNTER — Encounter: Payer: Self-pay | Admitting: *Deleted

## 2014-07-08 ENCOUNTER — Emergency Department (HOSPITAL_COMMUNITY)
Admission: EM | Admit: 2014-07-08 | Discharge: 2014-07-08 | Disposition: A | Discharge status: Home / Self Care | Payer: BC Managed Care – PPO | Attending: Emergency Medicine | Admitting: Emergency Medicine

## 2014-07-08 ENCOUNTER — Encounter (HOSPITAL_COMMUNITY): Payer: Self-pay | Admitting: Emergency Medicine

## 2014-07-08 DIAGNOSIS — Z91040 Latex allergy status: Secondary | ICD-10-CM | POA: Insufficient documentation

## 2014-07-08 DIAGNOSIS — R112 Nausea with vomiting, unspecified: Secondary | ICD-10-CM | POA: Insufficient documentation

## 2014-07-08 DIAGNOSIS — F17200 Nicotine dependence, unspecified, uncomplicated: Secondary | ICD-10-CM | POA: Insufficient documentation

## 2014-07-08 DIAGNOSIS — K220 Achalasia of cardia: Secondary | ICD-10-CM | POA: Insufficient documentation

## 2014-07-08 DIAGNOSIS — Z862 Personal history of diseases of the blood and blood-forming organs and certain disorders involving the immune mechanism: Secondary | ICD-10-CM | POA: Insufficient documentation

## 2014-07-08 DIAGNOSIS — Z9889 Other specified postprocedural states: Secondary | ICD-10-CM | POA: Insufficient documentation

## 2014-07-08 DIAGNOSIS — R102 Pelvic and perineal pain: Secondary | ICD-10-CM

## 2014-07-08 DIAGNOSIS — K219 Gastro-esophageal reflux disease without esophagitis: Secondary | ICD-10-CM | POA: Insufficient documentation

## 2014-07-08 DIAGNOSIS — Z79899 Other long term (current) drug therapy: Secondary | ICD-10-CM | POA: Insufficient documentation

## 2014-07-08 DIAGNOSIS — N949 Unspecified condition associated with female genital organs and menstrual cycle: Secondary | ICD-10-CM | POA: Insufficient documentation

## 2014-07-08 DIAGNOSIS — E079 Disorder of thyroid, unspecified: Secondary | ICD-10-CM | POA: Insufficient documentation

## 2014-07-08 LAB — POC URINE PREG, ED: PREG TEST UR: NEGATIVE

## 2014-07-08 LAB — URINALYSIS, ROUTINE W REFLEX MICROSCOPIC
Bilirubin Urine: NEGATIVE
Glucose, UA: NEGATIVE mg/dL
Hgb urine dipstick: NEGATIVE
KETONES UR: NEGATIVE mg/dL
LEUKOCYTES UA: NEGATIVE
NITRITE: NEGATIVE
PROTEIN: NEGATIVE mg/dL
Specific Gravity, Urine: 1.016 (ref 1.005–1.030)
Urobilinogen, UA: 0.2 mg/dL (ref 0.0–1.0)
pH: 5.5 (ref 5.0–8.0)

## 2014-07-08 LAB — CBC WITH DIFFERENTIAL/PLATELET
BASOS ABS: 0.1 10*3/uL (ref 0.0–0.1)
Basophils Relative: 1 % (ref 0–1)
EOS ABS: 0.1 10*3/uL (ref 0.0–0.7)
Eosinophils Relative: 2 % (ref 0–5)
HEMATOCRIT: 39.7 % (ref 36.0–46.0)
Hemoglobin: 13.2 g/dL (ref 12.0–15.0)
LYMPHS PCT: 43 % (ref 12–46)
Lymphs Abs: 3.4 10*3/uL (ref 0.7–4.0)
MCH: 26 pg (ref 26.0–34.0)
MCHC: 33.2 g/dL (ref 30.0–36.0)
MCV: 78.1 fL (ref 78.0–100.0)
Monocytes Absolute: 0.6 10*3/uL (ref 0.1–1.0)
Monocytes Relative: 7 % (ref 3–12)
Neutro Abs: 3.8 10*3/uL (ref 1.7–7.7)
Neutrophils Relative %: 47 % (ref 43–77)
PLATELETS: 355 10*3/uL (ref 150–400)
RBC: 5.08 MIL/uL (ref 3.87–5.11)
RDW: 19.4 % — AB (ref 11.5–15.5)
WBC: 8 10*3/uL (ref 4.0–10.5)

## 2014-07-08 LAB — COMPREHENSIVE METABOLIC PANEL
ALT: 11 U/L (ref 0–35)
AST: 17 U/L (ref 0–37)
Albumin: 4 g/dL (ref 3.5–5.2)
Alkaline Phosphatase: 91 U/L (ref 39–117)
Anion gap: 19 — ABNORMAL HIGH (ref 5–15)
BUN: 6 mg/dL (ref 6–23)
CALCIUM: 9.8 mg/dL (ref 8.4–10.5)
CO2: 20 meq/L (ref 19–32)
CREATININE: 0.65 mg/dL (ref 0.50–1.10)
Chloride: 103 mEq/L (ref 96–112)
GLUCOSE: 98 mg/dL (ref 70–99)
Potassium: 3.7 mEq/L (ref 3.7–5.3)
SODIUM: 142 meq/L (ref 137–147)
Total Bilirubin: 0.2 mg/dL — ABNORMAL LOW (ref 0.3–1.2)
Total Protein: 8.2 g/dL (ref 6.0–8.3)

## 2014-07-08 LAB — LIPASE, BLOOD: LIPASE: 67 U/L — AB (ref 11–59)

## 2014-07-08 MED ORDER — ONDANSETRON HCL 4 MG/2ML IJ SOLN
4.0000 mg | Freq: Once | INTRAMUSCULAR | Status: AC
  Administered 2014-07-08: 4 mg via INTRAVENOUS
  Filled 2014-07-08: qty 2

## 2014-07-08 MED ORDER — SODIUM CHLORIDE 0.9 % IV SOLN
1000.0000 mL | INTRAVENOUS | Status: DC
  Administered 2014-07-08: 1000 mL via INTRAVENOUS

## 2014-07-08 MED ORDER — AZITHROMYCIN 250 MG PO TABS
1000.0000 mg | ORAL_TABLET | Freq: Every day | ORAL | Status: DC
  Administered 2014-07-08: 1000 mg via ORAL
  Filled 2014-07-08 (×2): qty 4

## 2014-07-08 MED ORDER — IOHEXOL 300 MG/ML  SOLN
100.0000 mL | Freq: Once | INTRAMUSCULAR | Status: AC | PRN
Start: 2014-07-08 — End: 2014-07-08
  Administered 2014-07-08: 100 mL via INTRAVENOUS

## 2014-07-08 MED ORDER — DEXTROSE 5 % IV SOLN
1.0000 g | Freq: Once | INTRAVENOUS | Status: AC
  Administered 2014-07-08: 1 g via INTRAVENOUS
  Filled 2014-07-08: qty 10

## 2014-07-08 MED ORDER — HYDROCODONE-ACETAMINOPHEN 5-325 MG PO TABS: 1.0000 | ORAL_TABLET | ORAL | Status: DC | PRN

## 2014-07-08 MED ORDER — IOHEXOL 300 MG/ML  SOLN
50.0000 mL | Freq: Once | INTRAMUSCULAR | Status: AC | PRN
  Administered 2014-07-08: 50 mL via ORAL

## 2014-07-08 MED ORDER — SODIUM CHLORIDE 0.9 % IV SOLN
1000.0000 mL | Freq: Once | INTRAVENOUS | Status: AC
  Administered 2014-07-08: 1000 mL via INTRAVENOUS

## 2014-07-08 MED ORDER — HYDROMORPHONE HCL PF 1 MG/ML IJ SOLN
1.0000 mg | INTRAMUSCULAR | Status: AC | PRN
  Administered 2014-07-08 (×3): 1 mg via INTRAVENOUS
  Filled 2014-07-08 (×3): qty 1

## 2014-07-08 MED ORDER — HYDROCODONE-ACETAMINOPHEN 5-325 MG PO TABS
1.0000 | ORAL_TABLET | Freq: Once | ORAL | Status: AC
  Administered 2014-07-08: 1 via ORAL
  Filled 2014-07-08: qty 1

## 2014-07-08 NOTE — Progress Notes (Signed)
  CARE MANAGEMENT ED NOTE 07/08/2014  Patient:  Annette Anderson,Annette Anderson   Account Number:  [id number]  Date Initiated:  07/08/2014  Documentation initiated by:  Radford PaxFERRERO,Linnie Mcglocklin  Subjective/Objective Assessment:   Patient presents to Ed with abdominal pain, n//v/d     Subjective/Objective Assessment Detail:     Action/Plan:   Action/Plan Detail:   Anticipated DC Date:       Status Recommendation to Physician:   Result of Recommendation:    Other ED Services  Consult Working Plan    DC Planning Services  Other  PCP issues    Choice offered to / List presented to:            Status of service:  Completed, signed off  ED Comments:   ED Comments Detail:  EDCM spoke to patient at bedside.  Patient confirms she does not have a pcp with [employer] insurance.  EDCM instructed patient to call the phone number on the back of her insurance card or go to insurance company website to help her find a pcp who is close to her and within network. Patient verbalized understanding.  No further EDCM needs at this time.

## 2014-07-08 NOTE — Discharge Instructions (Signed)

## 2014-07-08 NOTE — Progress Notes (Signed)
Pt was a no show on June 17, 2014 at her GI appointment.

## 2014-07-08 NOTE — ED Notes (Signed)
Pt states 2 days ago started having RLQ abdominal pain w/ n/v/d, states the pain has gotten worse. Pt states having urinary frequency, denies burning, states at times she feels like it's hard to urinate, states urine is darker than normal.

## 2014-07-08 NOTE — ED Provider Notes (Signed)
CSN: [id number]     Arrival date & time 07/08/14  0825 History   First MD Initiated Contact with Patient 07/08/14 [phone]     Chief Complaint  Patient presents with  . Abdominal Pain  . Nausea  . Emesis    Patient is a 43 y.o. female presenting with abdominal pain and vomiting. The history is provided by the patient.  Abdominal Pain Pain location:  RLQ Pain quality comment:  "constant and it hurts" Pain radiates to:  Back Pain severity:  Severe Onset quality:  Gradual Duration:  2 days Timing:  Constant Progression:  Worsening Relieved by:  Nothing Associated symptoms: diarrhea, dysuria and vomiting   Associated symptoms: no constipation and no fever   Emesis Associated symptoms: abdominal pain and diarrhea   History of irregular menses.  Last was 2 months ago.  Not unusual for her.  Past Medical History  Diagnosis Date  . Thyroid disease   . Achalasia   . GERD (gastroesophageal reflux disease)   . Anemia   . Abdominal pain    Past Surgical History  Procedure Laterality Date  . Cesarean section    . Tubal ligation     Family History  Problem Relation Age of Onset  . Hypertension Mother   . Hypertension Father   . Diabetes Father   . Thyroid disease Sister     radioactive ablation   History  Substance Use Topics  . Smoking status: Current Some Day Smoker -- 0.10 packs/day    Types: Cigarettes  . Smokeless tobacco: Never Used  . Alcohol Use: Yes     Comment: occasional    OB History   Grav Para Term Preterm Abortions TAB SAB Ect Mult Living   3 3 3  0 0 0 0 0 0 3     Review of Systems  Constitutional: Negative for fever.  Gastrointestinal: Positive for vomiting, abdominal pain and diarrhea. Negative for constipation.  Genitourinary: Positive for dysuria and urgency.      Allergies  Bee venom; Sulfa antibiotics; Naproxen; and Latex Can take ibuprofen Home Medications   Prior to Admission medications   Medication Sig Start Date End Date Taking?  Authorizing Provider  levothyroxine (SYNTHROID) 100 MCG tablet Take 1 tablet (100 mcg total) by mouth daily. 02/25/14 02/25/15 Yes Judie BonusElizabeth A Kollar, MD  omeprazole (PRILOSEC) 20 MG capsule Take 20 mg by mouth daily.   Yes Historical Provider, MD  ondansetron (ZOFRAN) 4 MG tablet Take 1 tablet (4 mg total) by mouth every 6 (six) hours. 04/21/14  Yes Annett Gularacy N McLean, MD  albuterol (PROVENTIL HFA;VENTOLIN HFA) 108 (90 BASE) MCG/ACT inhaler Inhale 1-2 puffs into the lungs every 6 (six) hours as needed for wheezing or shortness of breath. 03/23/14   Lauren Doretha ImusM Parker, PA-C  calcium carbonate (TUMS EX) 750 MG chewable tablet Chew 2 tablets by mouth daily as needed for heartburn.    Historical Provider, MD  diphenhydrAMINE (BENADRYL) 25 mg capsule Take 25 mg by mouth every 6 (six) hours as needed for allergies.    Historical Provider, MD  ibuprofen (ADVIL,MOTRIN) 800 MG tablet Take 1 tablet (800 mg total) by mouth every 8 (eight) hours as needed. 04/21/14   Annett Gularacy N McLean, MD  oxyCODONE-acetaminophen (PERCOCET/ROXICET) 5-325 MG per tablet Take 1-2 tablets by mouth every 6 (six) hours as needed for severe pain. 04/22/14   Freddi StarrJulie N Ethier, PA-C  traMADol (ULTRAM) 50 MG tablet Take 1 tablet (50 mg total) by mouth every 6 (six) hours as needed.  05/03/14   Vida Roller, MD   BP 136/97  Pulse 81  Temp(Src) 98.2 F (36.8 C) (Oral)  Resp 20  Ht 5\' 2"  (1.575 m)  Wt 160 lb (72.576 kg)  BMI 29.26 kg/m2  SpO2 100%  LMP 05/08/2014 Physical Exam  Nursing note and vitals reviewed. Constitutional: She appears well-developed and well-nourished. No distress.  HENT:  Head: Normocephalic and atraumatic.  Right Ear: External ear normal.  Left Ear: External ear normal.  Eyes: Conjunctivae are normal. Right eye exhibits no discharge. Left eye exhibits no discharge. No scleral icterus.  Neck: Neck supple. No tracheal deviation present.  Cardiovascular: Normal rate, regular rhythm and intact distal pulses.   Pulmonary/Chest:  Effort normal and breath sounds normal. No stridor. No respiratory distress. She has no wheezes. She has no rales.  Abdominal: Soft. Bowel sounds are normal. She exhibits no distension. There is tenderness in the right upper quadrant and right lower quadrant. There is no rebound and no guarding. No hernia. Hernia confirmed negative in the right inguinal area and confirmed negative in the left inguinal area.  Genitourinary: Uterus is tender. Uterus is not enlarged. Cervix exhibits no motion tenderness and no discharge. Right adnexum displays tenderness. Right adnexum displays no mass and no fullness. Left adnexum displays no mass, no tenderness and no fullness. No signs of injury around the vagina. No vaginal discharge found.  Musculoskeletal: She exhibits no edema and no tenderness.  Neurological: She is alert. She has normal strength. No cranial nerve deficit (no facial droop, extraocular movements intact, no slurred speech) or sensory deficit. She exhibits normal muscle tone. She displays no seizure activity. Coordination normal.  Skin: Skin is warm and dry. No rash noted.  Psychiatric: She has a normal mood and affect.    ED Course  Procedures (including critical care time) Labs Review Labs Reviewed  CBC WITH DIFFERENTIAL - Abnormal; Notable for the following:    RDW 19.4 (*)    All other components within normal limits  COMPREHENSIVE METABOLIC PANEL - Abnormal; Notable for the following:    Total Bilirubin 0.2 (*)    Anion gap 19 (*)    All other components within normal limits  LIPASE, BLOOD - Abnormal; Notable for the following:    Lipase 67 (*)    All other components within normal limits  GC/CHLAMYDIA PROBE AMP  URINALYSIS, ROUTINE W REFLEX MICROSCOPIC  POC URINE PREG, ED    Imaging Review Ct Abdomen Pelvis W Contrast  07/08/2014   CLINICAL DATA:  Right lower quadrant abdominal pain with nausea, vomiting and diarrhea for 2 days. Urinary frequency.  EXAM: CT ABDOMEN AND PELVIS  WITH CONTRAST  TECHNIQUE: Multidetector CT imaging of the abdomen and pelvis was performed using the standard protocol following bolus administration of intravenous contrast.  CONTRAST:  50mL OMNIPAQUE IOHEXOL 300 MG/ML SOLN, OMNIPAQUE IOHEXOL 300 MG/ML SOLN  COMPARISON:  Pelvic ultrasound 04/25/2014. Abdominal pelvic CT 04/18/2014.  FINDINGS: Lung bases: Clear. There is no pleural or pericardial effusion. The distal esophagus is dilated.  Liver: Normal in density without focal abnormality.  Gallbladder/Biliary System: No evidence of cholelithiasis, gallbladder wall thickening or biliary dilatation.  Pancreas: Unremarkable.  Spleen: Unremarkable.  Adrenal glands: Normal.  Kidneys/Ureters/Bladder: Both kidneys appear normal. There is no evidence of renal mass or hydronephrosis. The ureters and bladder appear normal.  Bowel: The stomach and small bowel appear normal, including the terminal ileum. As before, the appendix is not visualized. There is no pericecal inflammatory change. The proximal  colon appears normal. Mild sigmoid colon wall prominence is attributed to incomplete distention and is similar to the prior study. There is no surrounding inflammatory change.  Peritoneum:   No ascites or peritoneal nodularity.  Vascular structures: No significant vascular findings.  Lymph nodes:  No enlarged lymph nodes identified.  Reproductive organs: The uterus and ovaries appear normal. There is no adnexal mass.  Abdominal wall:  No evidence of abdominal wall hernia.  Musculoskeletal: No acute or suspicious osseous findings. There is mild lower lumbar spine facet disease.  IMPRESSION: 1. No acute abdominal pelvic findings. No explanation for right lower quadrant abdominal pain identified. 2. The appendix is not visualized (nor was it seen previously). No evidence of pericecal inflammation. 3. Dilatation of the distal esophagus.   Electronically Signed   By: Roxy Horseman M.D.   On: 07/08/2014 13:40   Medications   0.9 %  sodium chloride infusion (0 mLs Intravenous Stopped 07/08/14 1030)    Followed by  0.9 %  sodium chloride infusion (1,000 mLs Intravenous New Bag/Given 07/08/14 1030)  cefTRIAXone (ROCEPHIN) 1 g in dextrose 5 % 50 mL IVPB (not administered)  azithromycin (ZITHROMAX) tablet 1,000 mg (not administered)  ondansetron (ZOFRAN) injection 4 mg (4 mg Intravenous Given 07/08/14 0948)  HYDROmorphone (DILAUDID) injection 1 mg (1 mg Intravenous Given 07/08/14 1151)  ondansetron (ZOFRAN) injection 4 mg (4 mg Intravenous Given 07/08/14 1151)  iohexol (OMNIPAQUE) 300 MG/ML solution 50 mL (50 mLs Oral Contrast Given 07/08/14 1159)  iohexol (OMNIPAQUE) 300 MG/ML solution 100 mL (100 mLs Intravenous Contrast Given 07/08/14 1320)     MDM   Final diagnoses:  Pelvic pain in female   CT scan without acute findings.  No appendicitis.  She does have ttp on pelvic exam.  Pt has history of irregular and heavy painful menses.  It is possible that her symptoms may be related to that.  Pt now states that her symptoms feels like she might be having a period soon.  Follow up with OB GYN.  Hydrocodone RX     Linwood Dibbles, MD 07/08/14 1431

## 2014-07-08 NOTE — ED Notes (Signed)
She is in no distress; and currently she is holding her infant.  She thanks us that she feels "so much better".

## 2014-07-09 LAB — GC/CHLAMYDIA PROBE AMP
CT Probe RNA: POSITIVE — AB
GC Probe RNA: NEGATIVE

## 2014-07-10 ENCOUNTER — Telehealth (HOSPITAL_BASED_OUTPATIENT_CLINIC_OR_DEPARTMENT_OTHER): Payer: Self-pay | Admitting: Emergency Medicine

## 2014-07-10 NOTE — Telephone Encounter (Signed)
Post ED Visit - Positive Culture Follow-up   Positive Chlamydia culture Treated with Rocephin and Zithromax, organism sensitive to the same and no further patient follow-up is required at this time. [employer] faxed  07/10/14 @ 1107, left voicemail for patient to call flow managers #  Jiles HaroldGammons, Maryfrances Portugal Chaney 07/10/2014, 11:08 AM

## 2014-07-11 ENCOUNTER — Telehealth (HOSPITAL_COMMUNITY): Payer: Self-pay

## 2014-07-12 ENCOUNTER — Telehealth (HOSPITAL_BASED_OUTPATIENT_CLINIC_OR_DEPARTMENT_OTHER): Payer: Self-pay | Admitting: Emergency Medicine

## 2014-07-12 ENCOUNTER — Telehealth (HOSPITAL_BASED_OUTPATIENT_CLINIC_OR_DEPARTMENT_OTHER): Payer: Self-pay

## 2014-07-12 NOTE — Telephone Encounter (Signed)
Pt returned call.  ID verified x 2.  Pt informed of dx, tx rcvd approp. , notify partner(s) for testing and tx and abstain from sex x 2 wks form tx date.

## 2014-10-26 ENCOUNTER — Encounter (HOSPITAL_COMMUNITY): Payer: Self-pay | Admitting: Emergency Medicine

## 2014-10-30 IMAGING — CT CT ABD-PELV W/ CM
2 of 5 series · 4 of 46 positions shown, 5 images · IV contrast (Iodine)
Comparison: 01/25/2014 CT abdomen and pelvis, pelvic ultrasound
04/14/2014

CLINICAL DATA: RIGHT lower quadrant pain for 5 days, nausea, pain
with urination, history achalasia, thyroid disease

EXAM:
CT ABDOMEN AND PELVIS WITH CONTRAST
TECHNIQUE: Multidetector CT imaging of the abdomen and pelvis was performed
using the standard protocol following bolus administration of
intravenous contrast. Sagittal and coronal MPR images reconstructed
from axial data set.
CONTRAST:  100mL OMNIPAQUE IOHEXOL 300 MG/ML SOLN IV. Dilute oral
contrast.

[Series 206: coronals · coronal · 0.50mm/px · 3 of 97 slices shown, 4 images]
[im 22/97  soft-tissue]
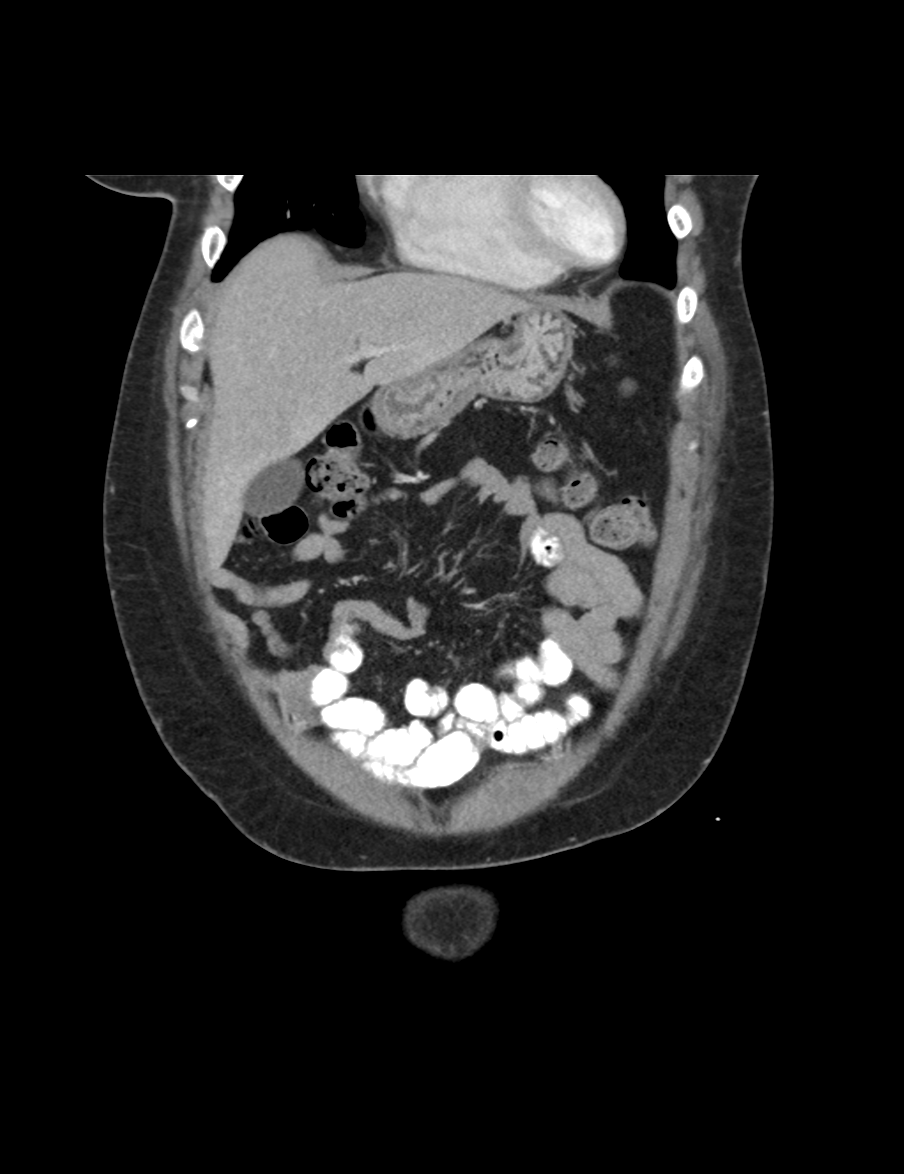
[im 22/97  bone]
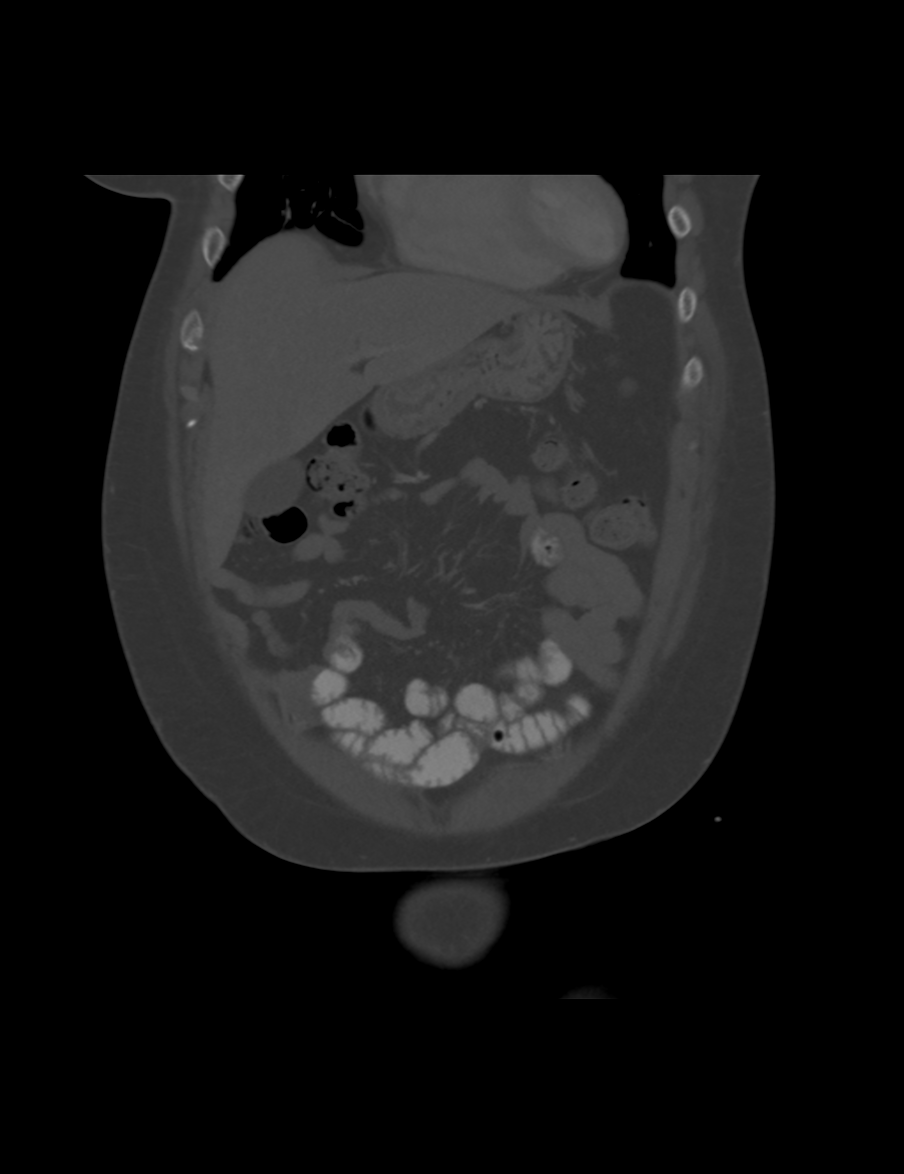
[im 54/97  soft-tissue]
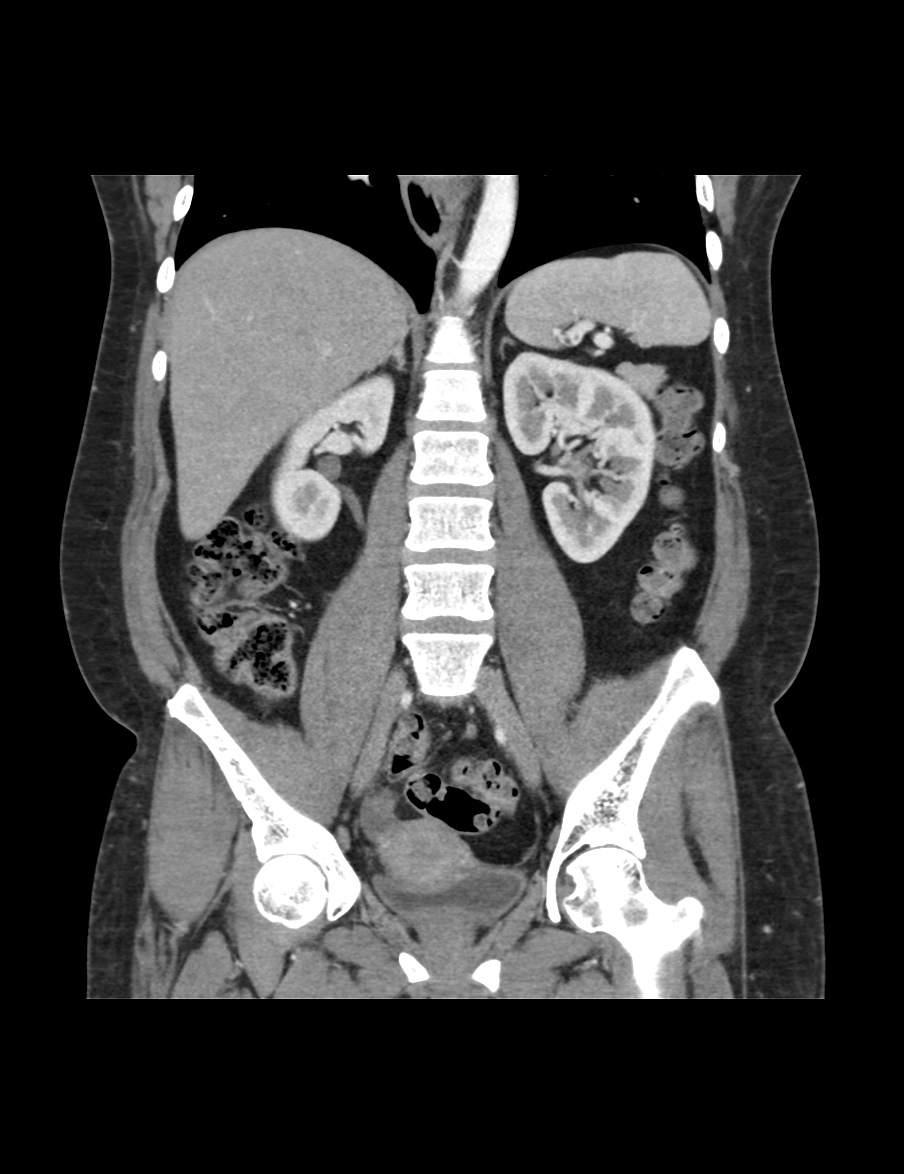
[im 75/97  soft-tissue]
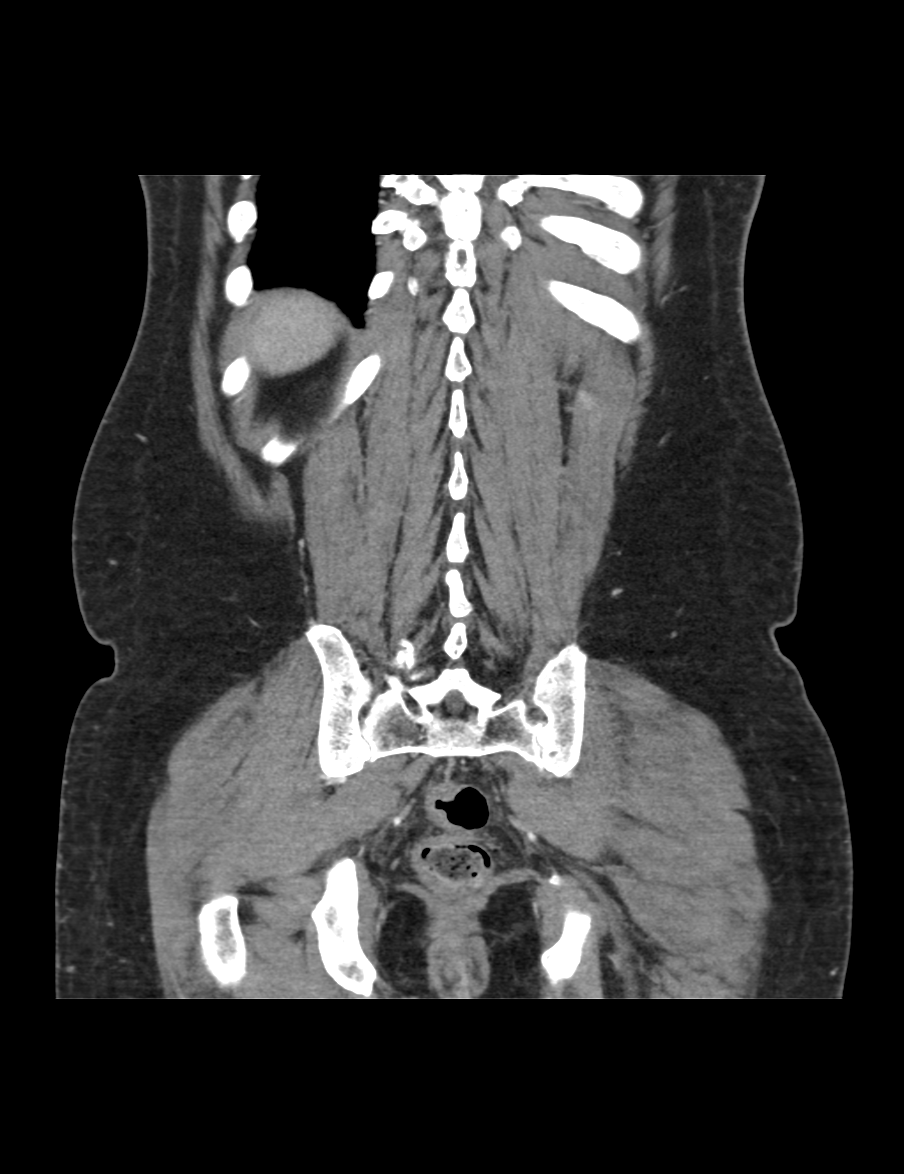

[Series 207: sagittals · sagittal · 0.50mm/px · 1 of 154 slices shown]
[im 52/154  soft-tissue]
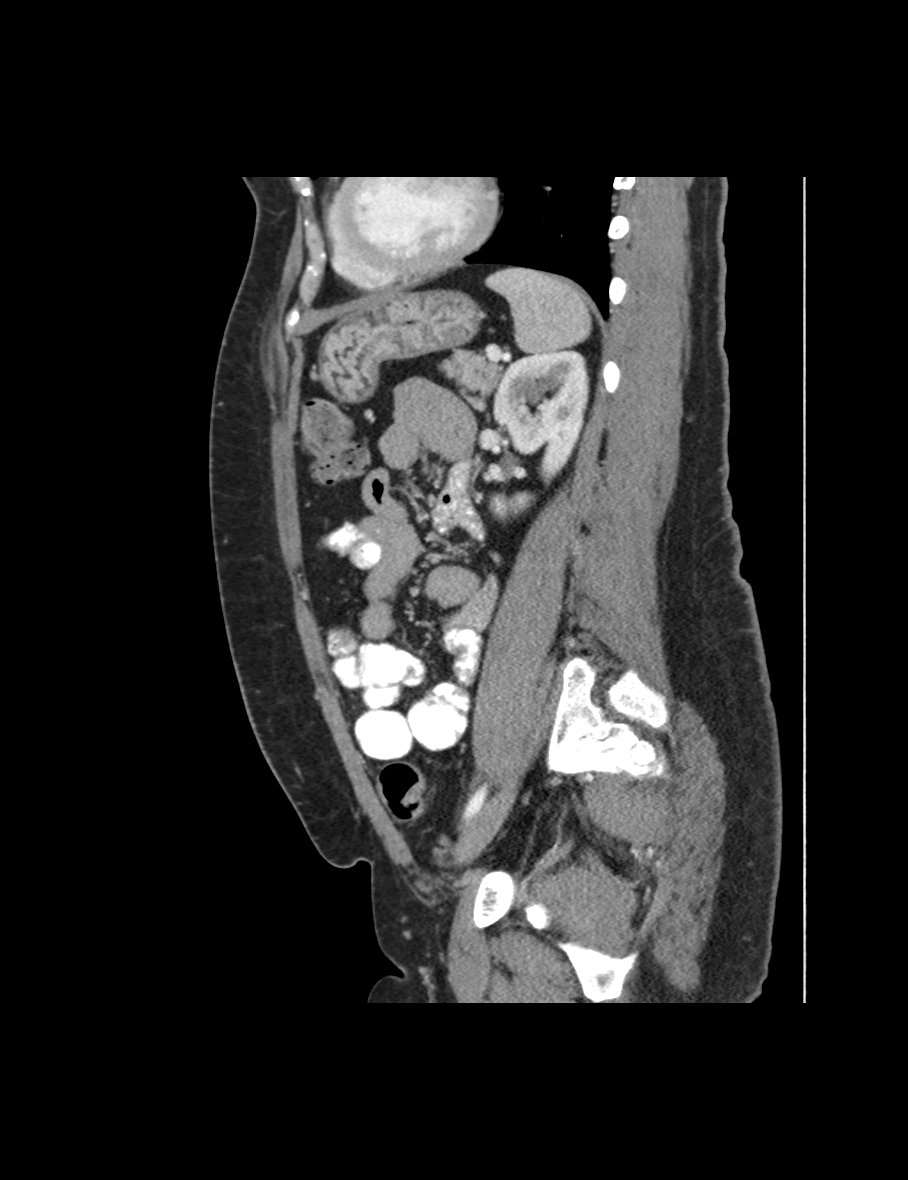

[4 of 46 positions shown; findings below may reference images not displayed]

FINDINGS: Lung bases clear.

Liver, spleen, pancreas, kidneys, and adrenal glands normal
appearance.

Splenule anterior to upper spleen.

Stomach decompressed, grossly unremarkable.

Appendix not visualized, no pericecal inflammatory process seen.

Stomach and bowel loops normal appearance.

Unremarkable bladder, uterus, and LEFT adnexa.

Small amount of fluid in RIGHT adnexa adjacent to a normal-sized
RIGHT ovary.

RIGHT ovarian cyst seen on the previous exam none identified on
current study.

Small rounded fat attenuation RIGHT adnexal nodule 18 x 12 mm
identified, question fat outlined by fluid, difficult to exclude a
small dermoid by CT.

This was seen on the prior CT exam as well but not visualized on an
interval pelvic ultrasound suggesting this does not represent a
discrete mass.

Tampon in vagina.

No mass, adenopathy, free air, hernia, or acute bone lesion.
IMPRESSION: Small amount of free fluid in RIGHT adnexa.

Small fat attenuation nodule in the RIGHT adnexa may represent fat
surrounded by small amount of fluid, with no definite ovarian
dermoid tumor seen on the prior pelvic ultrasound.

Nonvisualization of appendix.

No other intra-abdominal or intrapelvic abnormalities identified.

## 2016-12-09 ENCOUNTER — Encounter (HOSPITAL_COMMUNITY): Payer: Self-pay | Admitting: *Deleted

## 2016-12-09 ENCOUNTER — Emergency Department (HOSPITAL_COMMUNITY)
Admission: AD | Admit: 2016-12-09 | Discharge: 2016-12-09 | Disposition: A | Discharge status: Home / Self Care | Payer: Self-pay | Source: Ambulatory Visit | Attending: Emergency Medicine | Admitting: Emergency Medicine

## 2016-12-09 ENCOUNTER — Emergency Department (HOSPITAL_COMMUNITY): Payer: Self-pay

## 2016-12-09 DIAGNOSIS — F17210 Nicotine dependence, cigarettes, uncomplicated: Secondary | ICD-10-CM | POA: Insufficient documentation

## 2016-12-09 DIAGNOSIS — M545 Low back pain: Secondary | ICD-10-CM | POA: Insufficient documentation

## 2016-12-09 DIAGNOSIS — Z91040 Latex allergy status: Secondary | ICD-10-CM | POA: Insufficient documentation

## 2016-12-09 DIAGNOSIS — R1012 Left upper quadrant pain: Secondary | ICD-10-CM | POA: Insufficient documentation

## 2016-12-09 DIAGNOSIS — E039 Hypothyroidism, unspecified: Secondary | ICD-10-CM | POA: Insufficient documentation

## 2016-12-09 DIAGNOSIS — R1032 Left lower quadrant pain: Secondary | ICD-10-CM | POA: Insufficient documentation

## 2016-12-09 HISTORY — DX: Unspecified ovarian cyst, right side: N83.201

## 2016-12-09 HISTORY — DX: Unspecified ovarian cyst, left side: N83.202

## 2016-12-09 LAB — CBC WITH DIFFERENTIAL/PLATELET
BASOS PCT: 1 %
Basophils Absolute: 0.1 10*3/uL (ref 0.0–0.1)
EOS ABS: 0.4 10*3/uL (ref 0.0–0.7)
EOS PCT: 5 %
HCT: 43.3 % (ref 36.0–46.0)
Hemoglobin: 15.5 g/dL — ABNORMAL HIGH (ref 12.0–15.0)
LYMPHS ABS: 4 10*3/uL (ref 0.7–4.0)
Lymphocytes Relative: 45 %
MCH: 30.9 pg (ref 26.0–34.0)
MCHC: 35.8 g/dL (ref 30.0–36.0)
MCV: 86.4 fL (ref 78.0–100.0)
MONO ABS: 0.3 10*3/uL (ref 0.1–1.0)
MONOS PCT: 3 %
NEUTROS PCT: 46 %
Neutro Abs: 4.1 10*3/uL (ref 1.7–7.7)
PLATELETS: 289 10*3/uL (ref 150–400)
RBC: 5.01 MIL/uL (ref 3.87–5.11)
RDW: 14.3 % (ref 11.5–15.5)
WBC: 8.9 10*3/uL (ref 4.0–10.5)

## 2016-12-09 LAB — URINALYSIS, ROUTINE W REFLEX MICROSCOPIC
BILIRUBIN URINE: NEGATIVE
GLUCOSE, UA: NEGATIVE mg/dL
Hgb urine dipstick: NEGATIVE
Ketones, ur: NEGATIVE mg/dL
LEUKOCYTES UA: NEGATIVE
NITRITE: NEGATIVE
PH: 6 (ref 5.0–8.0)
PROTEIN: NEGATIVE mg/dL
Specific Gravity, Urine: 1.001 — ABNORMAL LOW (ref 1.005–1.030)

## 2016-12-09 LAB — COMPREHENSIVE METABOLIC PANEL
ALBUMIN: 4.6 g/dL (ref 3.5–5.0)
ALK PHOS: 99 U/L (ref 38–126)
ALT: 22 U/L (ref 14–54)
ANION GAP: 11 (ref 5–15)
AST: 29 U/L (ref 15–41)
BUN: 8 mg/dL (ref 6–20)
CALCIUM: 9.7 mg/dL (ref 8.9–10.3)
CHLORIDE: 105 mmol/L (ref 101–111)
CO2: 24 mmol/L (ref 22–32)
Creatinine, Ser: 0.57 mg/dL (ref 0.44–1.00)
GFR calc non Af Amer: >60 mL/min (ref >60)
GLUCOSE: 101 mg/dL — AB (ref 65–99)
POTASSIUM: 3.6 mmol/L (ref 3.5–5.1)
SODIUM: 140 mmol/L (ref 135–145)
Total Bilirubin: 0.5 mg/dL (ref 0.3–1.2)
Total Protein: 8.4 g/dL — ABNORMAL HIGH (ref 6.5–8.1)

## 2016-12-09 LAB — POCT PREGNANCY, URINE: Preg Test, Ur: NEGATIVE

## 2016-12-09 MED ORDER — OXYCODONE-ACETAMINOPHEN 5-325 MG PO TABS: 1.0000 | ORAL_TABLET | ORAL | 0 refills | Status: DC | PRN

## 2016-12-09 MED ORDER — ONDANSETRON HCL 8 MG PO TABS: 8.0000 mg | ORAL_TABLET | Freq: Three times a day (TID) | ORAL | 0 refills | Status: DC | PRN

## 2016-12-09 MED ORDER — LACTATED RINGERS IV BOLUS (SEPSIS)
1000.0000 mL | Freq: Once | INTRAVENOUS | Status: AC
  Administered 2016-12-09: 1000 mL via INTRAVENOUS

## 2016-12-09 MED ORDER — ONDANSETRON HCL 4 MG/2ML IJ SOLN
4.0000 mg | Freq: Once | INTRAMUSCULAR | Status: AC
  Administered 2016-12-09: 4 mg via INTRAVENOUS
  Filled 2016-12-09: qty 2

## 2016-12-09 MED ORDER — HYDROMORPHONE HCL 1 MG/ML IJ SOLN: 1.0000 mg | Freq: Once | INTRAMUSCULAR | Status: DC

## 2016-12-09 MED ORDER — IOPAMIDOL (ISOVUE-300) INJECTION 61%
INTRAVENOUS | Status: AC
  Filled 2016-12-09: qty 100

## 2016-12-09 MED ORDER — HYDROMORPHONE HCL 1 MG/ML IJ SOLN
1.0000 mg | Freq: Once | INTRAMUSCULAR | Status: AC
  Administered 2016-12-09: 1 mg via INTRAVENOUS
  Filled 2016-12-09: qty 1

## 2016-12-09 MED ORDER — IOPAMIDOL (ISOVUE-300) INJECTION 61%
100.0000 mL | Freq: Once | INTRAVENOUS | Status: AC | PRN
  Administered 2016-12-09: 100 mL via INTRAVENOUS

## 2016-12-09 MED ORDER — HYDROMORPHONE HCL 1 MG/ML IJ SOLN
1.0000 mg | Freq: Once | INTRAMUSCULAR | Status: AC
Start: 2016-12-09 — End: 2016-12-09
  Administered 2016-12-09: 1 mg via INTRAVENOUS
  Filled 2016-12-09: qty 1

## 2016-12-09 MED ORDER — SODIUM CHLORIDE 0.9 % IJ SOLN
INTRAMUSCULAR | Status: AC
  Filled 2016-12-09: qty 50

## 2016-12-09 NOTE — Discharge Instructions (Signed)
Gradually advance your diet, starting with a clear liquid diet first.  Follow-up with a gastroenterologist for further evaluation and treatment.

## 2016-12-09 NOTE — ED Notes (Signed)
Bed: [id number] Expected date:  Expected time:  Means of arrival:  Comments: tx from [facility]

## 2016-12-09 NOTE — MAU Provider Note (Signed)
History     CSN: [id number]  Arrival date and time: 12/09/16 0114   First Provider Initiated Contact with Patient 12/09/16 0158      Chief Complaint  Patient presents with  . Back Pain   HPI [name] is a 45 y.o. [phone] [id number] who presents to MAU today with complaint of flank pain worsening over the last 2-3 days. She also states associated N/V and loose stools during this time. She has tried Phenergan without relief. She denies fever, vaginal bleeding, hematuria or dysuria. She has noted increased urinary frequency. She also states that urine has been dark and cloudy. She states pain is severe. She has taken Gabapentin only for pain due to NSIAD allergy.   OB History    Gravida Para Term Preterm AB Living   3 3 3  0 0 3   SAB TAB Ectopic Multiple Live Births   0 0 0 0 3      Past Medical History:  Diagnosis Date  . Abdominal pain   . Achalasia   . Anemia   . Bilateral ovarian cysts   . GERD (gastroesophageal reflux disease)   . Thyroid disease     Past Surgical History:  Procedure Laterality Date  . CESAREAN SECTION    . TUBAL LIGATION      Family History  Problem Relation Age of Onset  . Hypertension Mother   . Hypertension Father   . Diabetes Father   . Thyroid disease Sister     radioactive ablation    Social History  Substance Use Topics  . Smoking status: Current Some Day Smoker    Packs/day: 0.10    Types: Cigarettes  . Smokeless tobacco: Never Used  . Alcohol use Yes     Comment: occasional     Allergies:  Allergies  Allergen Reactions  . Bee Venom Anaphylaxis  . Sulfa Antibiotics Anaphylaxis and Hives  . Naproxen Hives  . Nsaids Hives    Upset stomach   . Latex Rash    Prescriptions Prior to Admission  Medication Sig Dispense Refill Last Dose  . gabapentin (NEURONTIN) 100 MG capsule Take 200 mg by mouth 3 (three) times daily.   Past Week at Unknown time  . levothyroxine (SYNTHROID, LEVOTHROID) 100 MCG tablet Take 100 mcg by mouth  daily before breakfast.   12/08/2016 at Unknown time  . losartan (COZAAR) 100 MG tablet Take 100 mg by mouth daily.   12/08/2016 at Unknown time  . promethazine (PHENERGAN) 25 MG tablet Take 25 mg by mouth every 6 (six) hours as needed for nausea or vomiting.   12/08/2016 at Unknown time  . albuterol (PROVENTIL HFA;VENTOLIN HFA) 108 (90 BASE) MCG/ACT inhaler Inhale 1-2 puffs into the lungs every 6 (six) hours as needed for wheezing or shortness of breath. 1 Inhaler 0 More than a month at Unknown time  . calcium carbonate (TUMS EX) 750 MG chewable tablet Chew 2 tablets by mouth daily as needed for heartburn.   unknown  . diphenhydrAMINE (BENADRYL) 25 mg capsule Take 25 mg by mouth every 6 (six) hours as needed for allergies.   unknown  . docusate sodium (COLACE) 100 MG capsule Take 100 mg by mouth daily as needed for mild constipation.   Past Week at Unknown time  . ferrous sulfate 325 (65 FE) MG tablet Take 325 mg by mouth daily with breakfast.   07/07/2014 at Unknown time  . gemfibrozil (LOPID) 600 MG tablet Take 600 mg by mouth 2 (two) times  daily before a meal.   07/07/2014 at Unknown time  . HYDROcodone-acetaminophen (NORCO/VICODIN) 5-325 MG per tablet Take 1-2 tablets by mouth every 4 (four) hours as needed. 16 tablet 0   . ibuprofen (ADVIL,MOTRIN) 800 MG tablet Take 1 tablet (800 mg total) by mouth every 8 (eight) hours as needed. 90 tablet 0 07/07/2014 at Unknown time  . omeprazole (PRILOSEC) 20 MG capsule Take 20 mg by mouth daily.   07/07/2014 at Unknown time  . ondansetron (ZOFRAN) 4 MG tablet Take 1 tablet (4 mg total) by mouth every 6 (six) hours. 30 tablet 0 07/07/2014 at Unknown time  . verapamil (CALAN) 80 MG tablet Take 80 mg by mouth 3 (three) times daily.   07/07/2014 at Unknown time    Review of Systems  Constitutional: Negative for fever and malaise/fatigue.  Gastrointestinal: Positive for abdominal pain, diarrhea, nausea and vomiting. Negative for constipation.  Genitourinary:  Positive for flank pain. Negative for dysuria, frequency, hematuria and urgency.       Neg - vaginal bleeding, discharge   Physical Exam   Blood pressure 142/77, pulse 99, temperature 98.1 F (36.7 C), resp. rate 22, height 5\' 2"  (1.575 m), weight 183 lb 6.4 oz (83.2 kg), SpO2 100 %.  Physical Exam  Nursing note and vitals reviewed. Constitutional: She is oriented to person, place, and time. She appears well-developed and well-nourished. No distress.  HENT:  Head: Normocephalic and atraumatic.  Cardiovascular: Normal rate.   Respiratory: Effort normal.  GI: Soft. She exhibits no distension and no mass. There is tenderness (mild tenderness to palpation of the LLQ). There is no rebound, no guarding and no CVA tenderness.  Musculoskeletal:       Lumbar back: She exhibits tenderness and pain. She exhibits no bony tenderness, no swelling, no edema and no spasm.       Arms: Neurological: She is alert and oriented to person, place, and time.  Skin: Skin is warm and dry. No erythema.  Psychiatric: She has a normal mood and affect.    Results for orders placed or performed during the hospital encounter of 12/09/16 (from the past 24 hour(s))  Urinalysis, Routine w reflex microscopic     Status: Abnormal   Collection Time: 12/09/16  1:56 AM  Result Value Ref Range   Color, Urine COLORLESS (A) YELLOW   APPearance CLEAR CLEAR   Specific Gravity, Urine 1.001 (L) 1.005 - 1.030   pH 6.0 5.0 - 8.0   Glucose, UA NEGATIVE NEGATIVE mg/dL   Hgb urine dipstick NEGATIVE NEGATIVE   Bilirubin Urine NEGATIVE NEGATIVE   Ketones, ur NEGATIVE NEGATIVE mg/dL   Protein, ur NEGATIVE NEGATIVE mg/dL   Nitrite NEGATIVE NEGATIVE   Leukocytes, UA NEGATIVE NEGATIVE  Pregnancy, urine POC     Status: None   Collection Time: 12/09/16  2:03 AM  Result Value Ref Range   Preg Test, Ur NEGATIVE NEGATIVE  CBC with Differential/Platelet     Status: Abnormal (Preliminary result)   Collection Time: 12/09/16  2:43 AM    Result Value Ref Range   WBC 8.9 4.0 - 10.5 K/uL   RBC 5.01 3.87 - 5.11 MIL/uL   Hemoglobin 15.5 (H) 12.0 - 15.0 g/dL   HCT 40.943.3 81.136.0 - 91.446.0 %   MCV 86.4 78.0 - 100.0 fL   MCH 30.9 26.0 - 34.0 pg   MCHC 35.8 30.0 - 36.0 g/dL   RDW 78.214.3 95.611.5 - 21.315.5 %   Platelets 289 150 - 400 K/uL   Neutrophils Relative %  46 %   Neutro Abs 4.1 1.7 - 7.7 K/uL   Lymphocytes Relative 45 %   Lymphs Abs 4.0 0.7 - 4.0 K/uL   Monocytes Relative 3 %   Monocytes Absolute 0.3 0.1 - 1.0 K/uL   Eosinophils Relative 5 %   Eosinophils Absolute 0.4 0.0 - 0.7 K/uL   Basophils Relative 1 %   Basophils Absolute 0.1 0.0 - 0.1 K/uL   Other PENDING %  Comprehensive metabolic panel     Status: Abnormal   Collection Time: 12/09/16  2:43 AM  Result Value Ref Range   Sodium 140 135 - 145 mmol/L   Potassium 3.6 3.5 - 5.1 mmol/L   Chloride 105 101 - 111 mmol/L   CO2 24 22 - 32 mmol/L   Glucose, Bld 101 (H) 65 - 99 mg/dL   BUN 8 6 - 20 mg/dL   Creatinine, Ser 8.11 0.44 - 1.00 mg/dL   Calcium 9.7 8.9 - 91.4 mg/dL   Total Protein 8.4 (H) 6.5 - 8.1 g/dL   Albumin 4.6 3.5 - 5.0 g/dL   AST 29 15 - 41 U/L   ALT 22 14 - 54 U/L   Alkaline Phosphatase 99 38 - 126 U/L   Total Bilirubin 0.5 0.3 - 1.2 mg/dL   GFR calc non Af Amer >60 >60 mL/min   GFR calc Af Amer >60 >60 mL/min   Anion gap 11 5 - 15    MAU Course  Procedures None  MDM UPT - negative UA, CBC, CMP today  IV LR bolus with 4 mg Zofran and 1 mg Dilaudid given  Patient still appears and reports pain at 8/10 1 mg Dilaudid IV ordered  Discussed patient with Dr. Effie Shy at [facility]. He will accept transfer.  Assessment and Plan  A: Back pain  P: Transfer to [facility] for further evaluation of non-GYN complaint  Marny Lowenstein, PA-C  12/09/2016, 3:22 AM

## 2016-12-09 NOTE — MAU Note (Addendum)
R flank pain for 3 days. Peeing a lot but does not hurt when I pee. Has not taken any pain meds. Allergic to NSAIDS. Very nauseated and having pain LLQ.

## 2016-12-09 NOTE — ED Provider Notes (Signed)
[facility]-EMERGENCY DEPT Provider Note   CSN: [id number] Arrival date & time: 12/09/16  0114     History   Chief Complaint Chief Complaint  Patient presents with  . Back Pain    HPI Annette Anderson is a 45 y.o. female.  She presents for evaluation of left lower quadrant abdominal pain for 3 days, with nausea, vomiting, and diarrhea. She has had multiple episodes of both vomiting and diarrhea in the last 24 hours. There has not been any blood in either. She has had ongoing previously intermittent pain in her left abdomen, and right flank, for 1 month. She did not see a provider about it until this evening. She is not having fever, chills, weakness or dizziness. She has noted urinary frequency without dysuria. Her urine has been "dark". Her pain is severe. [facility], and after an extensive workup was sent here for further evaluation.  HPI  Past Medical History:  Diagnosis Date  . Abdominal pain   . Achalasia   . Anemia   . Bilateral ovarian cysts   . GERD (gastroesophageal reflux disease)   . Thyroid disease     Patient Active Problem List   Diagnosis Date Noted  . Achalasia   . Hypothyroidism 01/27/2014  . Abdominal pain 01/23/2014  . Anemia 01/23/2014  . GERD (gastroesophageal reflux disease) 01/23/2014    Past Surgical History:  Procedure Laterality Date  . CESAREAN SECTION    . TUBAL LIGATION      OB History    Gravida Para Term Preterm AB Living   3 3 3  0 0 3   SAB TAB Ectopic Multiple Live Births   0 0 0 0 3       Home Medications    Prior to Admission medications   Medication Sig Start Date End Date Taking? Authorizing Provider  albuterol (PROVENTIL HFA;VENTOLIN HFA) 108 (90 BASE) MCG/ACT inhaler Inhale 1-2 puffs into the lungs every 6 (six) hours as needed for wheezing or shortness of breath. 03/23/14  Yes Mellody DrownLauren Parker, PA-C  calcium carbonate (TUMS EX) 750 MG chewable tablet Chew 2  tablets by mouth daily as needed for heartburn.   Yes Historical Provider, MD  gabapentin (NEURONTIN) 300 MG capsule Take 300 mg by mouth 3 (three) times daily as needed (nerve pain).   Yes Historical Provider, MD  levothyroxine (SYNTHROID, LEVOTHROID) 100 MCG tablet Take 100 mcg by mouth daily before breakfast.   Yes Historical Provider, MD  losartan (COZAAR) 100 MG tablet Take 100 mg by mouth daily.   Yes Historical Provider, MD  omeprazole (PRILOSEC) 20 MG capsule Take 20 mg by mouth daily as needed (reflux).    Yes Historical Provider, MD  promethazine (PHENERGAN) 25 MG tablet Take 25 mg by mouth every 6 (six) hours as needed for nausea or vomiting.   Yes Historical Provider, MD  diphenhydrAMINE (BENADRYL) 25 mg capsule Take 25 mg by mouth every 6 (six) hours as needed for allergies.    Historical Provider, MD  docusate sodium (COLACE) 100 MG capsule Take 100 mg by mouth daily as needed for mild constipation.    Historical Provider, MD  ferrous sulfate 325 (65 FE) MG tablet Take 325 mg by mouth daily with breakfast.    Historical Provider, MD  gemfibrozil (LOPID) 600 MG tablet Take 600 mg by mouth 2 (two) times daily before a meal.    Historical Provider, MD  HYDROcodone-acetaminophen (NORCO/VICODIN) 5-325 MG per tablet Take 1-2  tablets by mouth every 4 (four) hours as needed. 07/08/14   Linwood Dibbles, MD  ibuprofen (ADVIL,MOTRIN) 800 MG tablet Take 1 tablet (800 mg total) by mouth every 8 (eight) hours as needed. 04/21/14   Pasty Spillers McLean-Scocuzza, MD  ondansetron (ZOFRAN) 4 MG tablet Take 1 tablet (4 mg total) by mouth every 6 (six) hours. 04/21/14   Pasty Spillers McLean-Scocuzza, MD  verapamil (CALAN) 80 MG tablet Take 80 mg by mouth 3 (three) times daily.    Historical Provider, MD    Family History Family History  Problem Relation Age of Onset  . Hypertension Mother   . Hypertension Father   . Diabetes Father   . Thyroid disease Sister     radioactive ablation    Social History Social History    Substance Use Topics  . Smoking status: Current Some Day Smoker    Packs/day: 0.10    Types: Cigarettes  . Smokeless tobacco: Never Used  . Alcohol use Yes     Comment: occasional      Allergies   Bee venom; Sulfa antibiotics; Naproxen; Nsaids; and Latex   Review of Systems Review of Systems  All other systems reviewed and are negative.    Physical Exam Updated Vital Signs BP 112/67 (BP Location: Left Arm)   Pulse 96   Temp 98.5 F (36.9 C) (Oral)   Resp 20   Ht 5\' 3"  (1.6 m)   Wt 180 lb (81.6 kg)   SpO2 100%   BMI 31.89 kg/m   Physical Exam  Constitutional: She is oriented to person, place, and time. She appears well-developed. She appears distressed (She is uncomfortable).  Overweight  HENT:  Head: Normocephalic and atraumatic.  Eyes: Conjunctivae and EOM are normal. Pupils are equal, round, and reactive to light.  Neck: Normal range of motion and phonation normal. Neck supple.  Cardiovascular: Normal rate and regular rhythm.   Pulmonary/Chest: Effort normal and breath sounds normal. She exhibits no tenderness.  Abdominal: Soft. She exhibits no distension and no mass. There is tenderness (Moderate left upper and lower quadrant). There is no rebound and no guarding. No hernia.  Genitourinary:  Genitourinary Comments: No costovertebral angle tenderness, with percussion  Musculoskeletal: Normal range of motion.  Neurological: She is alert and oriented to person, place, and time. She exhibits normal muscle tone.  Skin: Skin is warm and dry.  Psychiatric: She has a normal mood and affect. Her behavior is normal. Judgment and thought content normal.  Nursing note and vitals reviewed.    ED Treatments / Results  Labs (all labs ordered are listed, but only abnormal results are displayed) Labs Reviewed  URINALYSIS, ROUTINE W REFLEX MICROSCOPIC - Abnormal; Notable for the following:       Result Value   Color, Urine COLORLESS (*)    Specific Gravity, Urine 1.001  (*)    All other components within normal limits  CBC WITH DIFFERENTIAL/PLATELET - Abnormal; Notable for the following:    Hemoglobin 15.5 (*)    All other components within normal limits  COMPREHENSIVE METABOLIC PANEL - Abnormal; Notable for the following:    Glucose, Bld 101 (*)    Total Protein 8.4 (*)    All other components within normal limits  POCT PREGNANCY, URINE    EKG  EKG Interpretation None       Radiology No results found.  Procedures Procedures (including critical care time)  Medications Ordered in ED Medications  lactated ringers bolus 1,000 mL (1,000 mLs Intravenous Given  12/09/16 0244)  HYDROmorphone (DILAUDID) injection 1 mg (1 mg Intravenous Given 12/09/16 0251)  ondansetron (ZOFRAN) injection 4 mg (4 mg Intravenous Given 12/09/16 0251)  HYDROmorphone (DILAUDID) injection 1 mg (1 mg Intravenous Given 12/09/16 0330)  ondansetron (ZOFRAN) injection 4 mg (4 mg Intravenous Given 12/09/16 0429)     Initial Impression / Assessment and Plan / ED Course  I have reviewed the triage vital signs and the nursing notes.  Pertinent labs & imaging results that were available during my care of the patient were reviewed by me and considered in my medical decision making (see chart for details).  Clinical Course as of Dec 10 715  Sat Dec 09, 2016  [id number] NAD CT ABDOMEN PELVIS W CONTRAST [EW]    Clinical Course User Index [EW] Mancel BaleElliott Ines Rebel, MD    Medications  lactated ringers bolus 1,000 mL (1,000 mLs Intravenous Given 12/09/16 0244)  HYDROmorphone (DILAUDID) injection 1 mg (1 mg Intravenous Given 12/09/16 0251)  ondansetron (ZOFRAN) injection 4 mg (4 mg Intravenous Given 12/09/16 0251)  HYDROmorphone (DILAUDID) injection 1 mg (1 mg Intravenous Given 12/09/16 0330)  ondansetron (ZOFRAN) injection 4 mg (4 mg Intravenous Given 12/09/16 0429)  HYDROmorphone (DILAUDID) injection 1 mg (1 mg Intravenous Given 12/09/16 0638)  ondansetron (ZOFRAN) injection 4 mg (4 mg  Intravenous Given 12/09/16 0637)    Patient Vitals for the past 24 hrs:  BP Temp Temp src Pulse Resp SpO2 Height Weight  12/09/16 0642 118/72 98.6 F (37 C) Oral 88 18 100 % - -  12/09/16 0512 112/67 98.5 F (36.9 C) Oral 96 20 100 % 5\' 3"  (1.6 m) 180 lb (81.6 kg)  12/09/16 0442 128/72 - - 89 - - - -  12/09/16 0355 138/80 98.7 F (37.1 C) - 93 22 - - -  12/09/16 0147 142/77 - - 99 - 100 % - -  12/09/16 0130 (!) 148/110 - - - - - - -  12/09/16 0127 - 98.1 F (36.7 C) - 110 22 - 5\' 2"  (1.575 m) 183 lb 6.4 oz (83.2 kg)    7:12 AM Reevaluation with update and discussion. After initial assessment and treatment, an updated evaluation reveals She is more comfortable now. She would like referral to a local GI specialist. Findings discussed with patient and all questions answered. Americo Vallery L    Final Clinical Impressions(s) / ED Diagnoses   Final diagnoses:  Acute right-sided low back pain without sciatica   Nonspecific abdominal or flank pain. No evidence for acute intestinal disorder, history of bacterial infection or metabolic instability.  Nursing Notes Reviewed/ Care Coordinated Applicable Imaging Reviewed Interpretation of Laboratory Data incorporated into ED treatment  The patient appears reasonably screened and/or stabilized for discharge and I doubt any other medical condition or other [facility] requiring further screening, evaluation, or treatment in the ED at this time prior to discharge.  Plan: Home Medications- continue; Home Treatments- rest; return here if the recommended treatment, does not improve the symptoms; Recommended follow up- PCP prn. GI 1-2 weeks    New Prescriptions New Prescriptions   No medications on file     Mancel BaleElliott Ferlando Lia, MD 12/09/16 [phone]

## 2017-02-11 ENCOUNTER — Emergency Department (HOSPITAL_COMMUNITY)
Admission: EM | Admit: 2017-02-11 | Discharge: 2017-02-11 | Disposition: A | Discharge status: Home / Self Care | Payer: Self-pay | Attending: Emergency Medicine | Admitting: Emergency Medicine

## 2017-02-11 ENCOUNTER — Encounter (HOSPITAL_COMMUNITY): Payer: Self-pay | Admitting: Emergency Medicine

## 2017-02-11 ENCOUNTER — Emergency Department (HOSPITAL_COMMUNITY): Payer: Self-pay

## 2017-02-11 DIAGNOSIS — Z79899 Other long term (current) drug therapy: Secondary | ICD-10-CM | POA: Insufficient documentation

## 2017-02-11 DIAGNOSIS — I10 Essential (primary) hypertension: Secondary | ICD-10-CM | POA: Insufficient documentation

## 2017-02-11 DIAGNOSIS — R1032 Left lower quadrant pain: Secondary | ICD-10-CM | POA: Insufficient documentation

## 2017-02-11 DIAGNOSIS — Z91040 Latex allergy status: Secondary | ICD-10-CM | POA: Insufficient documentation

## 2017-02-11 DIAGNOSIS — F17210 Nicotine dependence, cigarettes, uncomplicated: Secondary | ICD-10-CM | POA: Insufficient documentation

## 2017-02-11 DIAGNOSIS — E039 Hypothyroidism, unspecified: Secondary | ICD-10-CM | POA: Insufficient documentation

## 2017-02-11 HISTORY — DX: Essential (primary) hypertension: I10

## 2017-02-11 LAB — COMPREHENSIVE METABOLIC PANEL
ALBUMIN: 4.2 g/dL (ref 3.5–5.0)
ALT: 23 U/L (ref 14–54)
ANION GAP: 15 (ref 5–15)
AST: 31 U/L (ref 15–41)
Alkaline Phosphatase: 99 U/L (ref 38–126)
BUN: 6 mg/dL (ref 6–20)
CHLORIDE: 110 mmol/L (ref 101–111)
CO2: 20 mmol/L — AB (ref 22–32)
Calcium: 9.8 mg/dL (ref 8.9–10.3)
Creatinine, Ser: 0.69 mg/dL (ref 0.44–1.00)
GFR calc Af Amer: >60 mL/min (ref >60)
GFR calc non Af Amer: >60 mL/min (ref >60)
GLUCOSE: 124 mg/dL — AB (ref 65–99)
POTASSIUM: 4 mmol/L (ref 3.5–5.1)
SODIUM: 145 mmol/L (ref 135–145)
TOTAL PROTEIN: 8.2 g/dL — AB (ref 6.5–8.1)
Total Bilirubin: 0.4 mg/dL (ref 0.3–1.2)

## 2017-02-11 LAB — CBC
HCT: 45.9 % (ref 36.0–46.0)
HEMOGLOBIN: 15.8 g/dL — AB (ref 12.0–15.0)
MCH: 31.5 pg (ref 26.0–34.0)
MCHC: 34.4 g/dL (ref 30.0–36.0)
MCV: 91.4 fL (ref 78.0–100.0)
Platelets: 319 10*3/uL (ref 150–400)
RBC: 5.02 MIL/uL (ref 3.87–5.11)
RDW: 13.1 % (ref 11.5–15.5)
WBC: 9.8 10*3/uL (ref 4.0–10.5)

## 2017-02-11 LAB — URINALYSIS, ROUTINE W REFLEX MICROSCOPIC
BILIRUBIN URINE: NEGATIVE
Glucose, UA: NEGATIVE mg/dL
Hgb urine dipstick: NEGATIVE
Ketones, ur: NEGATIVE mg/dL
Leukocytes, UA: NEGATIVE
NITRITE: NEGATIVE
PH: 5 (ref 5.0–8.0)
Protein, ur: NEGATIVE mg/dL
SPECIFIC GRAVITY, URINE: 1.034 — AB (ref 1.005–1.030)

## 2017-02-11 LAB — LIPASE, BLOOD: LIPASE: 72 U/L — AB (ref 11–51)

## 2017-02-11 MED ORDER — ONDANSETRON HCL 4 MG/2ML IJ SOLN
4.0000 mg | Freq: Once | INTRAMUSCULAR | Status: AC
  Administered 2017-02-11: 4 mg via INTRAVENOUS
  Filled 2017-02-11: qty 2

## 2017-02-11 MED ORDER — FENTANYL CITRATE (PF) 100 MCG/2ML IJ SOLN
50.0000 ug | INTRAMUSCULAR | Status: DC | PRN
  Administered 2017-02-11: 50 ug via INTRAVENOUS

## 2017-02-11 MED ORDER — OXYCODONE-ACETAMINOPHEN 5-325 MG PO TABS
2.0000 | ORAL_TABLET | Freq: Once | ORAL | Status: AC
  Administered 2017-02-11: 2 via ORAL
  Filled 2017-02-11: qty 2

## 2017-02-11 MED ORDER — SODIUM CHLORIDE 0.9 % IV BOLUS (SEPSIS)
1000.0000 mL | Freq: Once | INTRAVENOUS | Status: AC
  Administered 2017-02-11: 1000 mL via INTRAVENOUS

## 2017-02-11 MED ORDER — FENTANYL CITRATE (PF) 100 MCG/2ML IJ SOLN
INTRAMUSCULAR | Status: AC
  Filled 2017-02-11: qty 2

## 2017-02-11 MED ORDER — MORPHINE SULFATE (PF) 4 MG/ML IV SOLN
4.0000 mg | Freq: Once | INTRAVENOUS | Status: AC
Start: 2017-02-11 — End: 2017-02-11
  Administered 2017-02-11: 4 mg via INTRAVENOUS
  Filled 2017-02-11: qty 1

## 2017-02-11 MED ORDER — MORPHINE SULFATE (PF) 4 MG/ML IV SOLN
4.0000 mg | Freq: Once | INTRAVENOUS | Status: DC
  Filled 2017-02-11: qty 1

## 2017-02-11 MED ORDER — OXYCODONE-ACETAMINOPHEN 5-325 MG PO TABS: 1.0000 | ORAL_TABLET | Freq: Four times a day (QID) | ORAL | 0 refills | Status: DC | PRN

## 2017-02-11 MED ORDER — ONDANSETRON 4 MG PO TBDP: 4.0000 mg | ORAL_TABLET | Freq: Three times a day (TID) | ORAL | 0 refills | Status: DC | PRN

## 2017-02-11 MED ORDER — IOPAMIDOL (ISOVUE-300) INJECTION 61%
INTRAVENOUS | Status: AC
  Administered 2017-02-11: 100 mL
  Filled 2017-02-11: qty 100

## 2017-02-11 NOTE — Discharge Instructions (Signed)
You have been seen today for abdominal pain. There were no acute abnormalities on labs or imaging, other than some signs of dehydration. Be sure to drink plenty of water to stay well-hydrated. You should be drinking enough water to make your urine light yellow or almost clear. If it is darker than this, then you need to increase your water intake.  Please follow-up with the GI specialist as soon as possible. Call the number provided to set up an appointment. The ED will not be able to investigate this matter further and further investigation and management will have to be done by the GI specialist. Percocet for severe pain. Do not drive or perform other dangerous activities while taking the Percocet. Zofran for nausea/vomiting.

## 2017-02-11 NOTE — ED Triage Notes (Signed)
Per pt, abdominal pain began last night at 2100, pain constant since. Endorses nausea, diarrhea, no vomiting. Describes the pain as "hurting", denies vaginal bleeding or discharge, states no period x 3 years.

## 2017-02-11 NOTE — ED Provider Notes (Signed)
[facility]-EMERGENCY DEPT Provider Note   CSN: [id number] Arrival date & time: 02/11/17  0543     History   Chief Complaint Chief Complaint  Patient presents with  . Abdominal Pain    HPI Annette Anderson is a 46 y.o. female.  HPI   Annette Anderson is a 46 y.o. female, with a history of recurrent abdominal pain, anemia, and ovarian cysts, presenting to the ED with abdominal pain intermittent for the last few weeks, but constant since last night. States the pain seems to come on after she eats. This episode came on last night around 6:30pm, almost immediately after she ate dinner. She states that this episode of pain is different than others in the past, in that it is more intense and has not subsided. Pain is in the lower left quadrant, can not give a description, rated 10/10, intermittently radiating to the left lower back. Endorses nausea and liquid stools. No changes in pain with BM. Last solid BM was two days ago. She has made changes to her diet to try to treat the pain. Two months ago she cut out red meat and increased her water intake. This seemed to improve the pain, but not completely.  Denies fever/chills, vomiting, hematochezia/melena, urinary complaints, abnormal vaginal discharge or bleeding, or any other complaints.   Last menstrual cycle 3 years ago. Patient states that she has seen a GI specialist, Dr. Doristine Church in [place], [place], previously for her GERD-related pain, but has not followed up with one here in [place]. She has not seen anyone outpatient for her current symptoms.   Past Medical History:  Diagnosis Date  . Abdominal pain   . Achalasia   . Anemia   . Bilateral ovarian cysts   . GERD (gastroesophageal reflux disease)   . Hypertension   . Thyroid disease     Patient Active Problem List   Diagnosis Date Noted  . Achalasia   . Hypothyroidism 01/27/2014  . Abdominal pain 01/23/2014  . Anemia 01/23/2014  . GERD (gastroesophageal reflux disease) 01/23/2014     Past Surgical History:  Procedure Laterality Date  . CESAREAN SECTION    . TUBAL LIGATION      OB History    Gravida Para Term Preterm AB Living   3 3 3  0 0 3   SAB TAB Ectopic Multiple Live Births   0 0 0 0 3       Home Medications    Prior to Admission medications   Medication Sig Start Date End Date Taking? Authorizing Provider  albuterol (PROVENTIL HFA;VENTOLIN HFA) 108 (90 BASE) MCG/ACT inhaler Inhale 1-2 puffs into the lungs every 6 (six) hours as needed for wheezing or shortness of breath. 03/23/14  Yes Mellody Drown, PA-C  calcium carbonate (TUMS EX) 750 MG chewable tablet Chew 2 tablets by mouth daily as needed for heartburn.   Yes Historical Provider, MD  diphenhydrAMINE (BENADRYL) 25 mg capsule Take 25 mg by mouth every 6 (six) hours as needed for allergies.   Yes Historical Provider, MD  docusate sodium (COLACE) 100 MG capsule Take 100 mg by mouth daily as needed for mild constipation.   Yes Historical Provider, MD  gabapentin (NEURONTIN) 300 MG capsule Take 300 mg by mouth 3 (three) times daily as needed (nerve pain).   Yes Historical Provider, MD  gemfibrozil (LOPID) 600 MG tablet Take 600 mg by mouth 2 (two) times daily before a meal.   Yes Historical Provider, MD  levothyroxine (SYNTHROID, LEVOTHROID) 100 MCG tablet Take  100 mcg by mouth daily before breakfast.   Yes Historical Provider, MD  losartan (COZAAR) 100 MG tablet Take 100 mg by mouth daily.   Yes Historical Provider, MD  omeprazole (PRILOSEC) 20 MG capsule Take 20 mg by mouth daily as needed (reflux).    Yes Historical Provider, MD  ondansetron (ZOFRAN) 8 MG tablet Take 1 tablet (8 mg total) by mouth every 8 (eight) hours as needed for nausea or vomiting. 12/09/16  Yes Mancel Bale, MD  oxyCODONE-acetaminophen (PERCOCET) 5-325 MG tablet Take 1 tablet by mouth every 4 (four) hours as needed for severe pain. 12/09/16  Yes Mancel Bale, MD  promethazine (PHENERGAN) 25 MG tablet Take 25 mg by mouth every 6  (six) hours as needed for nausea or vomiting.   Yes Historical Provider, MD  ondansetron (ZOFRAN ODT) 4 MG disintegrating tablet Take 1 tablet (4 mg total) by mouth every 8 (eight) hours as needed for nausea or vomiting. 02/11/17   Shawn C Joy, PA-C  oxyCODONE-acetaminophen (PERCOCET/ROXICET) 5-325 MG tablet Take 1-2 tablets by mouth every 6 (six) hours as needed for severe pain. 02/11/17   Anselm Pancoast, PA-C    Family History Family History  Problem Relation Age of Onset  . Hypertension Mother   . Hypertension Father   . Diabetes Father   . Thyroid disease Sister     radioactive ablation    Social History Social History  Substance Use Topics  . Smoking status: Current Some Day Smoker    Packs/day: 0.10    Types: Cigarettes  . Smokeless tobacco: Never Used  . Alcohol use Yes     Comment: occasional      Allergies   Bee venom; Sulfa antibiotics; Naproxen; Nsaids; and Latex   Review of Systems Review of Systems  Constitutional: Negative for chills and fever.  Respiratory: Negative for shortness of breath.   Cardiovascular: Negative for chest pain.  Gastrointestinal: Positive for abdominal pain, diarrhea and nausea. Negative for blood in stool and vomiting.  Genitourinary: Negative for dysuria, frequency, vaginal bleeding and vaginal discharge.  All other systems reviewed and are negative.    Physical Exam Updated Vital Signs BP 109/72   Pulse (!) 127   Temp 98.9 F (37.2 C) (Oral)   Resp 23   Ht 5\' 2"  (1.575 m)   SpO2 100%   Physical Exam  Constitutional: She appears well-developed and well-nourished. No distress.  Patient is in apparent discomfort, changing positions frequently in an attempt to get more comfortable.   HENT:  Head: Normocephalic and atraumatic.  Mouth/Throat: Oropharynx is clear and moist.  Eyes: Conjunctivae are normal.  Neck: Neck supple.  Cardiovascular: Regular rhythm, normal heart sounds and intact distal pulses.  Tachycardia present.    Pulmonary/Chest: Effort normal and breath sounds normal. No respiratory distress.  Abdominal: Soft. There is tenderness in the left lower quadrant. There is guarding.  Musculoskeletal: She exhibits no edema.  Lymphadenopathy:    She has no cervical adenopathy.  Neurological: She is alert.  Skin: Skin is warm and dry. She is not diaphoretic.  Psychiatric: She has a normal mood and affect. Her behavior is normal.  Nursing note and vitals reviewed.    ED Treatments / Results  Labs (all labs ordered are listed, but only abnormal results are displayed) Labs Reviewed  LIPASE, BLOOD - Abnormal; Notable for the following:       Result Value   Lipase 72 (*)    All other components within normal limits  COMPREHENSIVE  METABOLIC PANEL - Abnormal; Notable for the following:    CO2 20 (*)    Glucose, Bld 124 (*)    Total Protein 8.2 (*)    All other components within normal limits  CBC - Abnormal; Notable for the following:    Hemoglobin 15.8 (*)    All other components within normal limits  URINALYSIS, ROUTINE W REFLEX MICROSCOPIC - Abnormal; Notable for the following:    Specific Gravity, Urine 1.034 (*)    All other components within normal limits    EKG  EKG Interpretation None       Radiology Ct Abdomen Pelvis W Contrast  Addendum Date: 02/11/2017   ADDENDUM REPORT: 02/11/2017 09:12 ADDENDUM: Voice recognition errors in impression, with corrected impression provided below. IMPRESSION: No acute intra-abdominal or intrapelvic abnormalities. Thickened distal esophagus distended by fluid and air consistent with history of achalasia. Electronically Signed   By: Ulyses Southward M.D.   On: 02/11/2017 09:12   Result Date: 02/11/2017 CLINICAL DATA:  RIGHT lower quadrant pain and tenderness, history GERD, hypertension, smoking, achalasia EXAM: CT ABDOMEN AND PELVIS WITH CONTRAST TECHNIQUE: Multidetector CT imaging of the abdomen and pelvis was performed using the standard protocol following  bolus administration of intravenous contrast. Sagittal and coronal MPR images reconstructed from axial data set. CONTRAST:  ISOVUE-300 IOPAMIDOL (ISOVUE-300) INJECTION 61% IV. COMPARISON:  12/09/2016 FINDINGS: Lower chest: Lung bases clear Hepatobiliary: Gallbladder and liver normal appearance Pancreas: Normal appearance Spleen: Normal appearance. Probable small splenule anterior to spleen, unchanged Adrenals/Urinary Tract: Adrenal glands, kidneys, ureters, and bladder normal appearance Stomach/Bowel: Appendix not visualized, surgical status uncertain, but no pericecal inflammatory process seen. Stomach and bowel loops normal appearance. Distended distal esophagus filled with air and fluid in demonstrate mild thickening of the distal esophageal wall. Vascular/Lymphatic: Vascular structures normal appearance. No adenopathy. Reproductive: Uterus and adnexa normal appearance Other: No free air or free fluid. No inflammatory process or hernia. Musculoskeletal: Osseous structures unremarkable IMPRESSION: Fluid acute tripped over low Richter pro which could be relief. Thickened distal esophagus is distended by fluid and air consistent with history of achalasia. Electronically Signed: By: Ulyses Southward M.D. On: 02/11/2017 08:40    Procedures Procedures (including critical care time)  Medications Ordered in ED Medications  fentaNYL (SUBLIMAZE) injection 50 mcg (50 mcg Intravenous Given 02/11/17 0554)  sodium chloride 0.9 % bolus 1,000 mL (0 mLs Intravenous Stopped 02/11/17 0906)    Followed by  sodium chloride 0.9 % bolus 1,000 mL (0 mLs Intravenous Stopped 02/11/17 0906)  morphine 4 MG/ML injection 4 mg (4 mg Intravenous Given 02/11/17 0723)  ondansetron (ZOFRAN) injection 4 mg (4 mg Intravenous Given 02/11/17 0723)  iopamidol (ISOVUE-300) 61 % injection (100 mLs  Contrast Given 02/11/17 0747)  oxyCODONE-acetaminophen (PERCOCET/ROXICET) 5-325 MG per tablet 2 tablet (2 tablets Oral Given 02/11/17 1610)      Initial Impression / Assessment and Plan / ED Course  I have reviewed the triage vital signs and the nursing notes.  Pertinent labs & imaging results that were available during my care of the patient were reviewed by me and considered in my medical decision making (see chart for details).       Patient presents with recurrent abdominal pain for weeks to months. She was seen for this complaint in December 2017, had a clear abdominal CT, and was told to follow up with PCP or GI specialist. Patient states that she did not do this. Patient is nontoxic appearing. Upon reassessment, patient no longer appears to be  uncomfortable. Her tachycardia resolved. She is laying still on the bed. No longer guarding. States her pain is now 4/10. Patient is no acute abnormalities on CT. Evidence of some dehydration on UA. Patient counseled on further care. Return precautions discussed. Importance of follow-up with a GI specialist and a PCP was also discussed. Resources were given. Patient voices understanding of all instructions and is comfortable with discharge.  Findings and plan of care discussed with Gerhard Munchobert Lockwood, MD.     Vitals:   02/11/17 [id number] 02/11/17 0556 02/11/17 0615 02/11/17 0700  BP:   109/72   Pulse:  (!) 137 (!) 127 113  Resp:  21 23 24   Temp:  98.9 F (37.2 C)    TempSrc:  Oral    SpO2:  99% 100% 98%  Height: 5\' 2"  (1.575 m)      Vitals:   02/11/17 0715 02/11/17 0730 02/11/17 0915 02/11/17 0916  BP: (!) 139/121 (!) 136/118 125/79 129/96  Pulse: (!) 147 97 94 96  Resp: 21 17 (!) 28 18  Temp:      TempSrc:      SpO2: 96% 98% 100% 100%  Height:         Final Clinical Impressions(s) / ED Diagnoses   Final diagnoses:  Left lower quadrant pain    New Prescriptions Discharge Medication List as of 02/11/2017  9:21 AM    START taking these medications   Details  ondansetron (ZOFRAN ODT) 4 MG disintegrating tablet Take 1 tablet (4 mg total) by mouth every 8 (eight)  hours as needed for nausea or vomiting., Starting Sun 02/11/2017, Print    !! oxyCODONE-acetaminophen (PERCOCET/ROXICET) 5-325 MG tablet Take 1-2 tablets by mouth every 6 (six) hours as needed for severe pain., Starting Sun 02/11/2017, Print     !! - Potential duplicate medications found. Please discuss with provider.       Anselm PancoastShawn C Joy, PA-C 02/11/17 1121    Gerhard Munchobert Lockwood, MD 02/12/17 [phone]

## 2017-02-11 NOTE — ED Notes (Signed)
4 mg morphine wasted in sharps container with Charge RN [place].  Unable to waste using pixis b/c pt had been discharged several hours ago.

## 2017-05-06 ENCOUNTER — Inpatient Hospital Stay (HOSPITAL_COMMUNITY)
Admission: AD | Admit: 2017-05-06 | Discharge: 2017-05-06 | Discharge status: Against Medical Advice | Payer: Self-pay | Source: Ambulatory Visit | Attending: Obstetrics & Gynecology | Admitting: Obstetrics & Gynecology

## 2017-05-06 NOTE — MAU Note (Signed)
Called from lobby, no answer 

## 2017-05-06 NOTE — MAU Note (Signed)
Called from waiting room, no answer

## 2017-06-08 ENCOUNTER — Ambulatory Visit
Admission: RE | Admit: 2017-06-08 | Discharge: 2017-06-08 | Disposition: A | Discharge status: Home / Self Care | Payer: Self-pay | Source: Ambulatory Visit | Attending: Occupational Medicine | Admitting: Occupational Medicine

## 2017-06-08 ENCOUNTER — Other Ambulatory Visit: Payer: Self-pay | Admitting: Occupational Medicine

## 2017-06-08 DIAGNOSIS — W19XXXA Unspecified fall, initial encounter: Secondary | ICD-10-CM

## 2017-09-07 ENCOUNTER — Encounter (HOSPITAL_COMMUNITY): Payer: Self-pay | Admitting: Emergency Medicine

## 2017-09-07 ENCOUNTER — Emergency Department (HOSPITAL_COMMUNITY)
Admission: EM | Admit: 2017-09-07 | Discharge: 2017-09-08 | Disposition: A | Discharge status: Home / Self Care | Payer: Self-pay | Attending: Emergency Medicine | Admitting: Emergency Medicine

## 2017-09-07 DIAGNOSIS — Y939 Activity, unspecified: Secondary | ICD-10-CM | POA: Insufficient documentation

## 2017-09-07 DIAGNOSIS — F17210 Nicotine dependence, cigarettes, uncomplicated: Secondary | ICD-10-CM | POA: Insufficient documentation

## 2017-09-07 DIAGNOSIS — S39012A Strain of muscle, fascia and tendon of lower back, initial encounter: Secondary | ICD-10-CM | POA: Insufficient documentation

## 2017-09-07 DIAGNOSIS — Y92007 Garden or yard of unspecified non-institutional (private) residence as the place of occurrence of the external cause: Secondary | ICD-10-CM | POA: Insufficient documentation

## 2017-09-07 DIAGNOSIS — T148XXA Other injury of unspecified body region, initial encounter: Secondary | ICD-10-CM

## 2017-09-07 DIAGNOSIS — W0110XA Fall on same level from slipping, tripping and stumbling with subsequent striking against unspecified object, initial encounter: Secondary | ICD-10-CM | POA: Insufficient documentation

## 2017-09-07 DIAGNOSIS — Y998 Other external cause status: Secondary | ICD-10-CM | POA: Insufficient documentation

## 2017-09-07 DIAGNOSIS — Z79899 Other long term (current) drug therapy: Secondary | ICD-10-CM | POA: Insufficient documentation

## 2017-09-07 DIAGNOSIS — M545 Low back pain, unspecified: Secondary | ICD-10-CM

## 2017-09-07 DIAGNOSIS — Z91040 Latex allergy status: Secondary | ICD-10-CM | POA: Insufficient documentation

## 2017-09-07 DIAGNOSIS — I10 Essential (primary) hypertension: Secondary | ICD-10-CM | POA: Insufficient documentation

## 2017-09-07 MED ORDER — OXYCODONE-ACETAMINOPHEN 5-325 MG PO TABS
ORAL_TABLET | ORAL | Status: AC
  Filled 2017-09-07: qty 1

## 2017-09-07 MED ORDER — OXYCODONE-ACETAMINOPHEN 5-325 MG PO TABS
1.0000 | ORAL_TABLET | Freq: Once | ORAL | Status: AC
  Administered 2017-09-08: 1 via ORAL
  Filled 2017-09-07: qty 1

## 2017-09-07 MED ORDER — OXYCODONE-ACETAMINOPHEN 5-325 MG PO TABS
1.0000 | ORAL_TABLET | Freq: Once | ORAL | Status: AC
  Administered 2017-09-07: 1 via ORAL

## 2017-09-07 NOTE — ED Notes (Signed)
Pt. Reports falling twice. Pt. Hit her left hip on concrete. Pt. Reports radiating pain from hip down to left leg. Pt.  Tearful and unable to get comfortable from pain.

## 2017-09-07 NOTE — ED Triage Notes (Signed)
Pt reports falling on her steps at home around 1730 this evening, states unsure cause of fall, states landed on her L side. No LOC. No deformities noted in triage. Reports L back pain radiating down to leg. Ambulatory to department. States tylenol not effective for pain.

## 2017-09-07 NOTE — ED Notes (Signed)
Pt given narcotic pain medicine in triage. Advised of side effects and instructed to avoid driving for a minimum of four hours.  

## 2017-09-08 ENCOUNTER — Emergency Department (HOSPITAL_COMMUNITY): Payer: Self-pay

## 2017-09-08 LAB — POC URINE PREG, ED: PREG TEST UR: NEGATIVE

## 2017-09-08 MED ORDER — HYDROCODONE-ACETAMINOPHEN 5-325 MG PO TABS: 1.0000 | ORAL_TABLET | Freq: Four times a day (QID) | ORAL | 0 refills | Status: DC | PRN

## 2017-09-08 NOTE — ED Notes (Signed)
Pt. Returned from xray via wheelchair

## 2017-09-08 NOTE — ED Provider Notes (Signed)
[facility]-EMERGENCY DEPT Provider Note   CSN: [id number] Arrival date & time: 09/07/17  2245     History   Chief Complaint Chief Complaint  Patient presents with  . Fall    HPI Annette Anderson is a 46 y.o. female who presents with left-sided back pain that began this afternoon at approximately 5 PM after mechanical fall. Patient states that she was outside attempting to remove a storm door, when she slipped on the wet ground and fell on her left side. Patient denies hitting her head and denies any LOC. She is able to recall the entire event. Patient states that she was able to get up and has been ambulatory since the accident. Patient reports less pain that radiates down the posterior aspect of her left leg since the incident. Patient has taken Tylenol with minimal improvement in symptoms. Denies fevers, weight loss, numbness/weakness of upper and lower extremities, bowel/bladder incontinence, saddle anesthesia, history of back surgery, history of IVDA. Patient denies any headache, vision changes, chest pain, neck pain, difficulty breathing, nausea/vomiting.  The history is provided by the patient.    Past Medical History:  Diagnosis Date  . Abdominal pain   . Achalasia   . Anemia   . Bilateral ovarian cysts   . GERD (gastroesophageal reflux disease)   . Hypertension   . Thyroid disease     Patient Active Problem List   Diagnosis Date Noted  . Achalasia   . Hypothyroidism 01/27/2014  . Abdominal pain 01/23/2014  . Anemia 01/23/2014  . GERD (gastroesophageal reflux disease) 01/23/2014    Past Surgical History:  Procedure Laterality Date  . CESAREAN SECTION    . TUBAL LIGATION      OB History    Gravida Para Term Preterm AB Living   0 0 3   SAB TAB Ectopic Multiple Live Births   0 0 0 0 3       Home Medications    Prior to Admission medications   Medication Sig Start Date End Date Taking? Authorizing Provider  acetaminophen (TYLENOL) 500 MG tablet Take 1,000  mg by mouth every 6 (six) hours as needed for mild pain.   Yes [provider]  levothyroxine (SYNTHROID, LEVOTHROID) 100 MCG tablet Take 100 mcg by mouth daily before breakfast.   Yes [provider]  losartan (COZAAR) 100 MG tablet Take 100 mg by mouth daily.   Yes [provider]  methocarbamol (ROBAXIN) 500 MG tablet Take 500 mg by mouth every 6 (six) hours as needed for muscle spasms.   Yes [provider]  albuterol (PROVENTIL HFA;VENTOLIN HFA) 108 (90 BASE) MCG/ACT inhaler Inhale 1-2 puffs into the lungs every 6 (six) hours as needed for wheezing or shortness of breath. Patient not taking: Reported on 09/08/2017 03/23/14   Mellody Drown, PA-C  calcium carbonate (TUMS EX) 750 MG chewable tablet Chew 2 tablets by mouth daily as needed for heartburn.    [provider]  HYDROcodone-acetaminophen (NORCO/VICODIN) 5-325 MG tablet Take 1-2 tablets by mouth every 6 (six) hours as needed. 09/08/17   Maxwell Caul, PA-C  ondansetron (ZOFRAN ODT) 4 MG disintegrating tablet Take 1 tablet (4 mg total) by mouth every 8 (eight) hours as needed for nausea or vomiting. Patient not taking: Reported on 09/08/2017 02/11/17   Joy, Ines Bloomer C, PA-C  ondansetron (ZOFRAN) 8 MG tablet Take 1 tablet (8 mg total) by mouth every 8 (eight) hours as needed for nausea or vomiting. Patient not taking: Reported  on 09/08/2017 12/09/16   Mancel Bale, MD  oxyCODONE-acetaminophen (PERCOCET) 5-325 MG tablet Take 1 tablet by mouth every 4 (four) hours as needed for severe pain. Patient not taking: Reported on 09/08/2017 12/09/16   Mancel Bale, MD  oxyCODONE-acetaminophen (PERCOCET/ROXICET) 5-325 MG tablet Take 1-2 tablets by mouth every 6 (six) hours as needed for severe pain. Patient not taking: Reported on 09/08/2017 02/11/17   Anselm Pancoast, PA-C    Family History Family History  Problem Relation Age of Onset  . Hypertension Mother   . Hypertension Father   . Diabetes Father   .  Thyroid disease Sister        radioactive ablation    Social History Social History  Substance Use Topics  . Smoking status: Current Some Day Smoker    Packs/day: 0.10    Types: Cigarettes  . Smokeless tobacco: Never Used  . Alcohol use Yes     Comment: occasional      Allergies   Bee venom; Sulfa antibiotics; Naproxen; Nsaids; and Latex   Review of Systems Review of Systems  Constitutional: Negative for fever.  Respiratory: Negative for shortness of breath.   Cardiovascular: Negative for chest pain.  Gastrointestinal: Negative for abdominal pain, nausea and vomiting.  Genitourinary: Negative for dysuria and hematuria.  Musculoskeletal: Positive for back pain. Negative for neck pain.  Neurological: Negative for weakness, numbness and headaches.     Physical Exam Updated Vital Signs BP (!) 138/93 (BP Location: Left Arm)   Pulse 87   Temp 98.6 F (37 C) (Oral)   Resp 20   SpO2 100%   Physical Exam  Constitutional: She is oriented to person, place, and time. She appears well-developed and well-nourished.  Appears uncomfortable but no acute distress   HENT:  Head: Normocephalic and atraumatic.  No tenderness to palpation of skull. No deformities or crepitus noted. No open wounds, abrasions or lacerations.   Eyes: Pupils are equal, round, and reactive to light. Conjunctivae and EOM are normal. Right eye exhibits no discharge. Left eye exhibits no discharge. No scleral icterus.  Neck: Full passive range of motion without pain. No spinous process tenderness and no muscular tenderness present.  Full flexion/extension and lateral movement of neck fully intact. No bony midline tenderness. No deformities or crepitus.   Cardiovascular: Normal rate, regular rhythm and normal pulses.   Pulmonary/Chest: Effort normal and breath sounds normal.  No anterior chest wall tenderness palpation. No deformities or crepitus.  Musculoskeletal:       Thoracic back: She exhibits no  tenderness.       Back:  Diffuse muscular tenderness overlying the left paraspinal muscles of the lumbar region that extends to the midline. No deformity or crepitus noted. Limited flexion/extension of back secondary to pain. Tenderness palpation extends from the left paraspinal muscles of the lumbar region and over to the left gluteal. No hip deformity or crepitus noted. No overlying ecchymosis, soft tissue swelling. No tenderness to palpation to bilateral knees and ankles. No deformities or crepitus noted. FROM of BLE without any difficulty.   Neurological: She is alert and oriented to person, place, and time. GCS eye subscore is 4. GCS verbal subscore is 5. GCS motor subscore is 6.  Cranial nerves III-XII intact Follows commands, Moves all extremities  5/5 strength to BUE and BLE  Sensation intact throughout all major nerve distributions of the BUE and BLE Normal finger to nose. No pronator drift. No gait abnormalities. Patient able to ambulate in the room without  difficulty. No slurred speech. No facial droop.   Skin: Skin is warm and dry.  Psychiatric: She has a normal mood and affect. Her speech is normal and behavior is normal.  Nursing note and vitals reviewed.    ED Treatments / Results  Labs (all labs ordered are listed, but only abnormal results are displayed) Labs Reviewed  POC URINE PREG, ED    EKG  EKG Interpretation None       Radiology Dg Lumbar Spine Complete  Result Date: 09/08/2017 CLINICAL DATA:  Fall with pain EXAM: LUMBAR SPINE - COMPLETE 4+ VIEW COMPARISON:  None. FINDINGS: Frontal, lateral, spot lumbosacral lateral, and bilateral oblique views were obtained. There are 5 non-rib-bearing lumbar type vertebral bodies. There is slight lumbar dextroscoliosis. There is no fracture or spondylolisthesis. The disc spaces appear unremarkable. There is no appreciable facet arthropathy. IMPRESSION: Slight scoliosis. No fracture or spondylolisthesis. No appreciable  arthropathy. Electronically Signed   By: Bretta Bang III M.D.   On: 09/08/2017 00:38   Dg Hip Unilat W Or Wo Pelvis 2-3 Views Left  Result Date: 09/08/2017 CLINICAL DATA:  Pain following fall EXAM: DG HIP (WITH OR WITHOUT PELVIS) 2-3V LEFT COMPARISON:  None. FINDINGS: Frontal pelvis as well as frontal and lateral left hip images were obtained. There is no evident fracture or dislocation. Hip joint and sacroiliac joints appear normal. No erosive change. There is a degree of arthropathy in the pubic symphysis. 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 No appreciable hip for sacroiliac joint space narrowing. No fracture or dislocation. Electronically Signed   By: Bretta Bang III M.D.   On: 09/08/2017 00:41    Procedures Procedures (including critical care time)  Medications Ordered in ED Medications  oxyCODONE-acetaminophen (PERCOCET/ROXICET) 5-325 MG per tablet 1 tablet (1 tablet Oral Given 09/07/17 2257)  oxyCODONE-acetaminophen (PERCOCET/ROXICET) 5-325 MG per tablet 1 tablet (1 tablet Oral Given 09/08/17 0004)     Initial Impression / Assessment and Plan / ED Course  I have reviewed the triage vital signs and the nursing notes.  Pertinent labs & imaging results that were available during my care of the patient were reviewed by me and considered in my medical decision making (see chart for details).     50 showed female who presents with left back pain that radiates to the left leg after mechanical fall on proxy 5 PM this evening. No head injury, no LOC. Patient is afebrile, non-toxic appearing, sitting comfortably on examination table. Vital signs reviewed and stable. No neuro deficits noted on exam. No red flag symptoms and history. Consider mechanical back pain versus muscle strain. Low suspicion for fracture or dislocation given history/physical exam. She/physical exam or not concerning for spinal abscess or cauda equina. Plan to obtain XR imaging for further evaluation.  Analgesics provided in the department.  Patient reviewed on the [employer] substance database. Patient has recently gotten multiple prescriptions for narcotics, including on 06/11/17, 06/25/17, 07/05/17, 07/27/17. Records reviewed show that patient also has a history of left back pain with sciatica. She has been seen multiple times for left back pain/left hip pain and falls that have landed on left side. She has received multiple narcotic prescriptions in 2016 2017. Patient had PT scheduled for evaluation of symptoms. In 2017, she was referred to pain management for symptoms and was a no-show for the appointment.  X-rays reviewed. Negative for any acute fracture dislocation of the lumbar spine or the left hip. Given distribution of pain and ability to bear weight on the  left lower extremity, do not suspect occult fracture. Discussed results with patient. Discussed conservative therapy options to partake in at home.patient is scheduled to follow-up with her orthopedic doctor in 4 days. Encouraged to keep that appointment for further evaluation. Instructed patient to take Tylenol and offered muscle relaxers. Patient states that she cannot take any NSAIDs because she is allergic to them. Patient states that her pain will not be controlled with Tylenol and Robaxin since that she has been taking previously. Explained that given patient's history and recent narcotic prescriptions and negative x-rays, that narcotics are not warranted in this situation. We discussed at length regarding her recent narcotic prescriptions, and patient states that those are "only prescribed when she absolutely needs them, otherwise take Tylenol." I again offered for a prescription for muscle relaxer but patient became very upset and tearful stating that "it was not going to do anything for her pain and that she could not stand to be in this much pain." Patient became very upset and continued saying that she would be right back in here because we were  not treating her pain. After much discussion with patient, will plan to provide very short course of pain medication for symptomatic relief. Patient instructed to take Tylenol only using pain medication for severe breakthrough pain. Instructed patient to follow-up with orthopedics as directed. Strict return precautions discussed. Patient expresses understanding and agreement to plan.    Final Clinical Impressions(s) / ED Diagnoses   Final diagnoses:  Acute left-sided low back pain without sciatica  Muscle strain    New Prescriptions Discharge Medication List as of 09/08/2017  1:03 AM    START taking these medications   Details  HYDROcodone-acetaminophen (NORCO/VICODIN) 5-325 MG tablet Take 1-2 tablets by mouth every 6 (six) hours as needed., Starting Sat 09/08/2017, Print         Maxwell Caul, PA-C 09/08/17 0120    Doug Sou, MD 09/08/17 1445

## 2017-09-08 NOTE — Discharge Instructions (Signed)
Take Tylenol as directed for pain.  Take the pain medication as directed for severe or breakthrough pain. Do not take the pain medication and her previously prescribed Robaxin at the same time.  Follow-up with your orthopedic doctor as previously arranged.   Follow-up with your primary care doctor in next 2-4 days or with the [facility] for further evaluation.  Return to the Emergency Department immediately for any worsening back pain, neck pain, difficulty walking, numbness/weaknss of your arms or legs, urinary or bowel accidents, fever or any other worsening or concerning symptoms.

## 2017-11-20 ENCOUNTER — Emergency Department (HOSPITAL_COMMUNITY)
Admission: EM | Admit: 2017-11-20 | Discharge: 2017-11-20 | Disposition: A | Discharge status: Home / Self Care | Payer: Self-pay | Attending: Emergency Medicine | Admitting: Emergency Medicine

## 2017-11-20 ENCOUNTER — Other Ambulatory Visit: Payer: Self-pay

## 2017-11-20 ENCOUNTER — Emergency Department (HOSPITAL_COMMUNITY): Payer: Self-pay

## 2017-11-20 ENCOUNTER — Encounter (HOSPITAL_COMMUNITY): Payer: Self-pay

## 2017-11-20 DIAGNOSIS — Y999 Unspecified external cause status: Secondary | ICD-10-CM | POA: Insufficient documentation

## 2017-11-20 DIAGNOSIS — F17210 Nicotine dependence, cigarettes, uncomplicated: Secondary | ICD-10-CM | POA: Insufficient documentation

## 2017-11-20 DIAGNOSIS — M545 Low back pain, unspecified: Secondary | ICD-10-CM

## 2017-11-20 DIAGNOSIS — X500XXA Overexertion from strenuous movement or load, initial encounter: Secondary | ICD-10-CM | POA: Insufficient documentation

## 2017-11-20 DIAGNOSIS — Z79899 Other long term (current) drug therapy: Secondary | ICD-10-CM | POA: Insufficient documentation

## 2017-11-20 DIAGNOSIS — M5442 Lumbago with sciatica, left side: Secondary | ICD-10-CM | POA: Insufficient documentation

## 2017-11-20 DIAGNOSIS — M5432 Sciatica, left side: Secondary | ICD-10-CM

## 2017-11-20 DIAGNOSIS — Y929 Unspecified place or not applicable: Secondary | ICD-10-CM | POA: Insufficient documentation

## 2017-11-20 DIAGNOSIS — Y939 Activity, unspecified: Secondary | ICD-10-CM | POA: Insufficient documentation

## 2017-11-20 DIAGNOSIS — I10 Essential (primary) hypertension: Secondary | ICD-10-CM | POA: Insufficient documentation

## 2017-11-20 LAB — POC URINE PREG, ED: PREG TEST UR: NEGATIVE

## 2017-11-20 MED ORDER — CYCLOBENZAPRINE HCL 10 MG PO TABS
10.0000 mg | ORAL_TABLET | Freq: Once | ORAL | Status: DC
  Filled 2017-11-20: qty 1

## 2017-11-20 MED ORDER — DIAZEPAM 5 MG PO TABS: 5.0000 mg | ORAL_TABLET | Freq: Two times a day (BID) | ORAL | 0 refills | Status: DC

## 2017-11-20 MED ORDER — HYDROCODONE-ACETAMINOPHEN 5-325 MG PO TABS
1.0000 | ORAL_TABLET | Freq: Once | ORAL | Status: AC
  Administered 2017-11-20: 1 via ORAL
  Filled 2017-11-20: qty 1

## 2017-11-20 NOTE — Discharge Instructions (Signed)
Follow up with your doctor. Do not drive while taking the valium as it will make you sleepy.

## 2017-11-20 NOTE — ED Provider Notes (Signed)
[facility]-EMERGENCY DEPT Provider Note   CSN: [id number] Arrival date & time: 11/20/17  1134     History   Chief Complaint Chief Complaint  Patient presents with  . Back Injury    HPI Annette Anderson is a 46 y.o. female with hx of chronic low back pain and has been in pain management in the past presents to the ED with back pain. The pain is located in the lower back. Patient reports that she picked up a bucket with liquid laundry detergent in it. When she picked it up she felt a pop in her lower back and felt severe pain. The injury occurred last night at 6 pm. Patient reports taking tylenol and muscle relaxer without relief. She also took 1/2 percocet last night before bed. The pain continues today and is worse.   The history is provided by the patient. No language interpreter was used.  Back Pain   This is a new problem. The current episode started 12 to 24 hours ago. The problem occurs constantly. The problem has been gradually worsening. The pain is associated with lifting heavy objects. The pain is present in the lumbar spine. The quality of the pain is described as shooting. The pain radiates to the left thigh. The pain is at a severity of 7/10. The symptoms are aggravated by twisting and bending. The pain is the same all the time. Associated symptoms include leg pain. Pertinent negatives include no fever, no numbness, no abdominal pain, no bowel incontinence, no bladder incontinence and no dysuria.    Past Medical History:  Diagnosis Date  . Abdominal pain   . Achalasia   . Anemia   . Bilateral ovarian cysts   . GERD (gastroesophageal reflux disease)   . Hypertension   . Thyroid disease     Patient Active Problem List   Diagnosis Date Noted  . Achalasia   . Hypothyroidism 01/27/2014  . Abdominal pain 01/23/2014  . Anemia 01/23/2014  . GERD (gastroesophageal reflux disease) 01/23/2014    Past Surgical History:  Procedure Laterality Date  .  CESAREAN SECTION    . TUBAL LIGATION      OB History    Gravida Para Term Preterm AB Living   3 3 3  0 0 3   SAB TAB Ectopic Multiple Live Births   0 0 0 0 3       Home Medications    Prior to Admission medications   Medication Sig Start Date End Date Taking? Authorizing Provider  acetaminophen (TYLENOL) 500 MG tablet Take 1,000 mg by mouth every 6 (six) hours as needed for mild pain.    [provider]  albuterol (PROVENTIL HFA;VENTOLIN HFA) 108 (90 BASE) MCG/ACT inhaler Inhale 1-2 puffs into the lungs every 6 (six) hours as needed for wheezing or shortness of breath. Patient not taking: Reported on 09/08/2017 03/23/14   Mellody Drown, PA-C  calcium carbonate (TUMS EX) 750 MG chewable tablet Chew 2 tablets by mouth daily as needed for heartburn.    [provider]  diazepam (VALIUM) 5 MG tablet Take 1 tablet (5 mg total) by mouth 2 (two) times daily. 11/20/17   Janne Napoleon, NP  HYDROcodone-acetaminophen (NORCO/VICODIN) 5-325 MG tablet Take 1-2 tablets by mouth every 6 (six) hours as needed. 09/08/17   Maxwell Caul, PA-C  levothyroxine (SYNTHROID, LEVOTHROID) 100 MCG tablet Take 100 mcg by mouth daily before breakfast.    [provider]  losartan (COZAAR) 100 MG tablet Take  100 mg by mouth daily.    [provider]  methocarbamol (ROBAXIN) 500 MG tablet Take 500 mg by mouth every 6 (six) hours as needed for muscle spasms.    [provider]  ondansetron (ZOFRAN ODT) 4 MG disintegrating tablet Take 1 tablet (4 mg total) by mouth every 8 (eight) hours as needed for nausea or vomiting. Patient not taking: Reported on 09/08/2017 02/11/17   Joy, Ines BloomerShawn C, PA-C  ondansetron (ZOFRAN) 8 MG tablet Take 1 tablet (8 mg total) by mouth every 8 (eight) hours as needed for nausea or vomiting. Patient not taking: Reported on 09/08/2017 12/09/16   Mancel BaleWentz, Elliott, MD  oxyCODONE-acetaminophen (PERCOCET) 5-325 MG tablet Take 1 tablet by mouth every 4 (four)  hours as needed for severe pain. Patient not taking: Reported on 09/08/2017 12/09/16   Mancel BaleWentz, Elliott, MD  oxyCODONE-acetaminophen (PERCOCET/ROXICET) 5-325 MG tablet Take 1-2 tablets by mouth every 6 (six) hours as needed for severe pain. Patient not taking: Reported on 09/08/2017 02/11/17   Anselm PancoastJoy, Shawn C, PA-C    Family History Family History  Problem Relation Age of Onset  . Hypertension Mother   . Hypertension Father   . Diabetes Father   . Thyroid disease Sister        radioactive ablation    Social History Social History   Tobacco Use  . Smoking status: Current Some Day Smoker    Packs/day: 0.10    Types: Cigarettes  . Smokeless tobacco: Never Used  Substance Use Topics  . Alcohol use: Yes    Comment: occasional   . Drug use: No     Allergies   Bee venom; Sulfa antibiotics; Naproxen; Nsaids; and Latex   Review of Systems Review of Systems  Constitutional: Negative for chills and fever.  HENT: Negative.   Eyes: Negative for visual disturbance.  Gastrointestinal: Negative for abdominal pain and bowel incontinence.  Genitourinary: Negative for bladder incontinence and dysuria.       No loss of control of bladder or bowels.  Musculoskeletal: Positive for back pain.  Skin: Negative for wound.  Neurological: Negative for numbness.  Psychiatric/Behavioral: Negative for confusion.     Physical Exam Updated Vital Signs BP (!) 158/109 (BP Location: Right Arm)   Pulse 78   Temp 98 F (36.7 C) (Oral)   Resp 16   Ht 5\' 2"  (1.575 m)   Wt 65.8 kg (145 lb)   SpO2 100%   BMI 26.52 kg/m   Physical Exam  Constitutional: She appears well-developed and well-nourished. No distress.  HENT:  Head: Normocephalic and atraumatic.  Right Ear: Tympanic membrane normal.  Left Ear: Tympanic membrane normal.  Nose: Nose normal.  Mouth/Throat: Uvula is midline, oropharynx is clear and moist and mucous membranes are normal.  Eyes: EOM are normal.  Neck: Normal range of motion.  Neck supple.  Cardiovascular: Normal rate and regular rhythm.  Pulmonary/Chest: Effort normal. She has no wheezes. She has no rales.  Abdominal: Soft. There is no tenderness.  Musculoskeletal:       Right shoulder: She exhibits tenderness. Decreased range of motion: due to pain.       Lumbar back: She exhibits tenderness, pain and spasm. She exhibits normal pulse.       Back:  Scoliosis Tender with palpation over left sciatic nerve. Pedal pulses 2+,   Neurological: She is alert. She has normal strength and normal reflexes. No sensory deficit. Gait normal.  Skin: Skin is warm and dry.  Psychiatric: She has a  normal mood and affect. Her behavior is normal.  Nursing note and vitals reviewed.    ED Treatments / Results  Labs (all labs ordered are listed, but only abnormal results are displayed) Labs Reviewed  POC URINE PREG, ED    Radiology Dg Lumbar Spine Complete  Result Date: 11/20/2017 CLINICAL DATA:  Twisted back last p.m. EXAM: LUMBAR SPINE - COMPLETE 4+ VIEW COMPARISON:  Lumbar spine 09/08/2017. FINDINGS: Lumbar spine scoliosis concave left. No acute bony abnormality identified . No evidence of fracture. No focal abnormality identified . IMPRESSION: Lumbar spine scoliosis concave left. No acute or focal abnormality identified. Electronically Signed   By: Maisie Fushomas  Register   On: 11/20/2017 14:35    Procedures Procedures (including critical care time)  Medications Ordered in ED Medications  cyclobenzaprine (FLEXERIL) tablet 10 mg (10 mg Oral Refused 11/20/17 1357)  HYDROcodone-acetaminophen (NORCO/VICODIN) 5-325 MG per tablet 1 tablet (1 tablet Oral Given 11/20/17 1357)     Initial Impression / Assessment and Plan / ED Course  I have reviewed the triage vital signs and the nursing notes. Patient with back pain.  No neurological deficits and normal neuro exam.  Patient can walk but states is painful.  No loss of bowel or bladder control.  No concern for cauda equina.  Will  treat for muscle spasm with Valium. Patient has a f/u appointment with her PCP to try and get back in pain management clinic. Return precautions discussed.   Final Clinical Impressions(s) / ED Diagnoses   Final diagnoses:  Lumbosacral pain    ED Discharge Orders        Ordered    diazepam (VALIUM) 5 MG tablet  2 times daily     11/20/17 1456       Kerrie Buffaloeese, Piercen Covino MesickM, NP 11/20/17 1504    Lavera GuiseLiu, Dana Duo, MD 11/20/17 [phone]

## 2017-11-20 NOTE — ED Triage Notes (Signed)
Patient c/o back pain after lifting a 5 gallon bucket of penny nails. Patient states she felt a pop. Patient c/o left lower back pain that radiates down the left leg. Patient states she has been taking Tylenol and a muscle relaxant that she has at home.

## 2017-12-18 ENCOUNTER — Emergency Department (HOSPITAL_COMMUNITY)
Admission: EM | Admit: 2017-12-18 | Discharge: 2017-12-18 | Disposition: A | Discharge status: Home / Self Care | Payer: Self-pay | Attending: Emergency Medicine | Admitting: Emergency Medicine

## 2017-12-18 ENCOUNTER — Emergency Department (HOSPITAL_COMMUNITY): Payer: Self-pay

## 2017-12-18 ENCOUNTER — Encounter (HOSPITAL_COMMUNITY): Payer: Self-pay

## 2017-12-18 ENCOUNTER — Other Ambulatory Visit: Payer: Self-pay

## 2017-12-18 DIAGNOSIS — Z91040 Latex allergy status: Secondary | ICD-10-CM | POA: Insufficient documentation

## 2017-12-18 DIAGNOSIS — F17210 Nicotine dependence, cigarettes, uncomplicated: Secondary | ICD-10-CM | POA: Insufficient documentation

## 2017-12-18 DIAGNOSIS — Z79899 Other long term (current) drug therapy: Secondary | ICD-10-CM | POA: Insufficient documentation

## 2017-12-18 DIAGNOSIS — I10 Essential (primary) hypertension: Secondary | ICD-10-CM | POA: Insufficient documentation

## 2017-12-18 DIAGNOSIS — E039 Hypothyroidism, unspecified: Secondary | ICD-10-CM | POA: Insufficient documentation

## 2017-12-18 DIAGNOSIS — M25561 Pain in right knee: Secondary | ICD-10-CM | POA: Insufficient documentation

## 2017-12-18 MED ORDER — OXYCODONE-ACETAMINOPHEN 5-325 MG PO TABS
1.0000 | ORAL_TABLET | Freq: Once | ORAL | Status: AC
  Administered 2017-12-18: 1 via ORAL
  Filled 2017-12-18: qty 1

## 2017-12-18 NOTE — Discharge Instructions (Signed)
Take 1000 mg of Tylenol every 8 hours to help with your pain.  Apply ice for 15-20 minutes up to 3-4 times a day.  This will help with pain and swelling.  Wearing the knee sleeve applies compression, which should also help to improve your pain.  You can use your crutches at home until your pain improves and you are able to put weight on your right foot.  When you are sitting and resting, please raise your leg to elevate it above the level of your heart, this will also help with swelling.  If your pain does not start to improve within the next 5-7 days, please call and schedule a follow-up appointment with your orthopedist.  If you develop new or worsening symptoms, including a new fall or injury, numbness in the lower part of the leg, or other new concerning symptoms, please return to the emergency department for re-evaluation.

## 2017-12-18 NOTE — ED Provider Notes (Signed)
[facility] Provider Note   CSN: [id number] Arrival date & time: 12/18/17  1332     History   Chief Complaint Chief Complaint  Patient presents with  . Knee Pain    HPI Annette Anderson is a 46 y.o. female who presents to the emergency department with a chief complaint of right knee pain that began last night after she fell forward while walking up steps and hit her knee against the stair. She states that she felt as if her knee gave out causing the fall.  She denies right hip pain, right ankle pain, numbness, or weakness.  Her pain is aggravated with weightbearing and improved by nonweightbearing.  She characterizes the pain as a sharp and states that it radiates down her right leg. No previous right knee surgeries.  She reports a previous injury in high school where she injured her right knee playing sports.  She is treated her symptoms with Tylenol and by applying ice.  She is established with [facility].  The history is provided by the patient. No language interpreter was used.    Past Medical History:  Diagnosis Date  . Abdominal pain   . Achalasia   . Anemia   . Bilateral ovarian cysts   . GERD (gastroesophageal reflux disease)   . Hypertension   . Thyroid disease     Patient Active Problem List   Diagnosis Date Noted  . Achalasia   . Hypothyroidism 01/27/2014  . Abdominal pain 01/23/2014  . Anemia 01/23/2014  . GERD (gastroesophageal reflux disease) 01/23/2014    Past Surgical History:  Procedure Laterality Date  . CESAREAN SECTION    . TUBAL LIGATION      OB History    Gravida Para Term Preterm AB Living   3 3 3  0 0 3   SAB TAB Ectopic Multiple Live Births   0 0 0 0 3       Home Medications    Prior to Admission medications   Medication Sig Start Date End Date Taking? Authorizing Provider  acetaminophen (TYLENOL) 500 MG tablet Take 1,000 mg by mouth every 6 (six) hours as needed for mild pain.    Yes [provider]  calcium carbonate (TUMS EX) 750 MG chewable tablet Chew 2 tablets by mouth daily as needed for heartburn.   Yes [provider]  gabapentin (NEURONTIN) 400 MG capsule Take 400 mg by mouth 3 (three) times daily.   Yes [provider]  levothyroxine (SYNTHROID, LEVOTHROID) 100 MCG tablet Take 100 mcg by mouth daily before breakfast.   Yes [provider]  losartan (COZAAR) 100 MG tablet Take 100 mg by mouth daily.   Yes [provider]  VERAPAMIL HCL PO Take by mouth.    [provider]    Family History Family History  Problem Relation Age of Onset  . Hypertension Mother   . Hypertension Father   . Diabetes Father   . Thyroid disease Sister        radioactive ablation    Social History Social History   Tobacco Use  . Smoking status: Current Some Day Smoker    Packs/day: 0.10    Types: Cigarettes  . Smokeless tobacco: Never Used  Substance Use Topics  . Alcohol use: Yes    Comment: occasional   . Drug use: No     Allergies   Bee venom; Sulfa antibiotics; Naproxen; Nsaids; Doxycycline; and Latex   Review of Systems Review of  Systems  Musculoskeletal: Positive for arthralgias, gait problem and myalgias. Negative for back pain and joint swelling.  Skin: Negative for wound.  Neurological: Negative for weakness and numbness.     Physical Exam Updated Vital Signs BP (!) 131/97   Pulse 71   Temp 98.2 F (36.8 C) (Oral)   Resp 16   Ht 5\' 2"  (1.575 m)   Wt 65.8 kg (145 lb)   SpO2 98%   BMI 26.52 kg/m   Physical Exam  Constitutional: No distress.  HENT:  Head: Normocephalic.  Eyes: Conjunctivae are normal.  Neck: Neck supple.  Cardiovascular: Normal rate and regular rhythm. Exam reveals no gallop and no friction rub.  No murmur heard. Pulmonary/Chest: Effort normal. No respiratory distress.  Abdominal: Soft. She exhibits no distension.  Musculoskeletal: Normal range of motion. She  exhibits tenderness. She exhibits no edema or deformity.  No tenderness to palpation over the right hip or ankle and both joints have full active and passive range of motion.  Tender to palpation over the medial joint line of the right knee.  No lateral joint line tenderness.  Patellar, DP, PT pulses are 2+ and symmetric.  Sensation is intact throughout the bilateral lower extremities.  Able to independently move all digits of the bilateral feet.  Negative anterior posterior drawer test.  Negative valgus and varus stress test.  She can bear weight on the bilateral lower extremities. Antalgic gait.   No overlying ecchymosis, erythema, edema, warmth, or abrasions.  Neurological: She is alert.  Skin: Skin is warm. No rash noted.  Psychiatric: Her behavior is normal.  Nursing note and vitals reviewed.    ED Treatments / Results  Labs (all labs ordered are listed, but only abnormal results are displayed) Labs Reviewed - No data to display  EKG  EKG Interpretation None       Radiology Dg Knee Complete 4 Views Right  Result Date: 12/18/2017 CLINICAL DATA:  Shooting pain in the right knee worse with weight-bearing today. No known injury. Initial encounter. EXAM: RIGHT KNEE - COMPLETE 4+ VIEW COMPARISON:  Plain films right knee 12/11/2013. FINDINGS: No evidence of fracture, dislocation, or joint effusion. No evidence of arthropathy or other focal bone abnormality. Soft tissues are unremarkable. IMPRESSION: Normal exam. Electronically Signed   By: Drusilla Kannerhomas  Dalessio M.D.   On: 12/18/2017 14:36    Procedures Procedures (including critical care time)  Medications Ordered in ED Medications  oxyCODONE-acetaminophen (PERCOCET/ROXICET) 5-325 MG per tablet 1 tablet (1 tablet Oral Given 12/18/17 1652)     Initial Impression / Assessment and Plan / ED Course  I have reviewed the triage vital signs and the nursing notes.  Pertinent labs & imaging results that were available during my care of  the patient were reviewed by me and considered in my medical decision making (see chart for details).     Patient X-Ray negative for obvious fracture or dislocation. Pain managed in ED. Pt advised to follow up with orthopedics if symptoms persist for possibility of missed fracture diagnosis. Patient given brace while in ED, conservative therapy recommended and discussed. Patient will be dc home & is agreeable with above plan.  Final Clinical Impressions(s) / ED Diagnoses   Final diagnoses:  Acute pain of right knee    ED Discharge Orders    None       Barkley BoardsMcDonald, Amillya Chavira A, PA-C 12/18/17 1657    Shaune PollackIsaacs, [place], MD 12/19/17 [phone]

## 2017-12-18 NOTE — ED Notes (Signed)
Pt given wheelchair, Leonette Mostharles, [place]

## 2017-12-18 NOTE — ED Triage Notes (Signed)
Pt states she fell on pavement yesterday and landed on right knee. Since fall she has been experiencing right knee pain and trouble with ambulation.

## 2018-02-17 ENCOUNTER — Emergency Department (HOSPITAL_COMMUNITY)
Admission: EM | Admit: 2018-02-17 | Discharge: 2018-02-17 | Disposition: A | Discharge status: Home / Self Care | Payer: BLUE CROSS/BLUE SHIELD | Attending: Emergency Medicine | Admitting: Emergency Medicine

## 2018-02-17 ENCOUNTER — Encounter (HOSPITAL_COMMUNITY): Payer: Self-pay | Admitting: Emergency Medicine

## 2018-02-17 ENCOUNTER — Emergency Department (HOSPITAL_COMMUNITY): Payer: BLUE CROSS/BLUE SHIELD

## 2018-02-17 ENCOUNTER — Other Ambulatory Visit: Payer: Self-pay

## 2018-02-17 DIAGNOSIS — E039 Hypothyroidism, unspecified: Secondary | ICD-10-CM | POA: Insufficient documentation

## 2018-02-17 DIAGNOSIS — F17210 Nicotine dependence, cigarettes, uncomplicated: Secondary | ICD-10-CM | POA: Insufficient documentation

## 2018-02-17 DIAGNOSIS — K2900 Acute gastritis without bleeding: Secondary | ICD-10-CM | POA: Diagnosis not present

## 2018-02-17 DIAGNOSIS — R1013 Epigastric pain: Secondary | ICD-10-CM

## 2018-02-17 DIAGNOSIS — Z79899 Other long term (current) drug therapy: Secondary | ICD-10-CM | POA: Diagnosis not present

## 2018-02-17 DIAGNOSIS — I10 Essential (primary) hypertension: Secondary | ICD-10-CM | POA: Insufficient documentation

## 2018-02-17 DIAGNOSIS — Z91040 Latex allergy status: Secondary | ICD-10-CM | POA: Diagnosis not present

## 2018-02-17 DIAGNOSIS — R1012 Left upper quadrant pain: Secondary | ICD-10-CM | POA: Diagnosis present

## 2018-02-17 LAB — URINALYSIS, ROUTINE W REFLEX MICROSCOPIC
Bilirubin Urine: NEGATIVE
GLUCOSE, UA: NEGATIVE mg/dL
HGB URINE DIPSTICK: NEGATIVE
KETONES UR: NEGATIVE mg/dL
LEUKOCYTES UA: NEGATIVE
Nitrite: NEGATIVE
Protein, ur: NEGATIVE mg/dL
Specific Gravity, Urine: 1.04 — ABNORMAL HIGH (ref 1.005–1.030)
pH: 5 (ref 5.0–8.0)

## 2018-02-17 LAB — CBC
HCT: 43.1 % (ref 36.0–46.0)
Hemoglobin: 14.4 g/dL (ref 12.0–15.0)
MCH: 31.9 pg (ref 26.0–34.0)
MCHC: 33.4 g/dL (ref 30.0–36.0)
MCV: 95.6 fL (ref 78.0–100.0)
PLATELETS: 336 10*3/uL (ref 150–400)
RBC: 4.51 MIL/uL (ref 3.87–5.11)
RDW: 13.4 % (ref 11.5–15.5)
WBC: 10 10*3/uL (ref 4.0–10.5)

## 2018-02-17 LAB — I-STAT BETA HCG BLOOD, ED (MC, WL, AP ONLY)

## 2018-02-17 LAB — COMPREHENSIVE METABOLIC PANEL
ALBUMIN: 3.5 g/dL (ref 3.5–5.0)
ALT: 15 U/L (ref 14–54)
AST: 21 U/L (ref 15–41)
Alkaline Phosphatase: 71 U/L (ref 38–126)
Anion gap: 12 (ref 5–15)
BUN: 10 mg/dL (ref 6–20)
CALCIUM: 8.7 mg/dL — AB (ref 8.9–10.3)
CO2: 21 mmol/L — ABNORMAL LOW (ref 22–32)
CREATININE: 0.87 mg/dL (ref 0.44–1.00)
Chloride: 112 mmol/L — ABNORMAL HIGH (ref 101–111)
GFR calc Af Amer: >60 mL/min (ref >60)
GFR calc non Af Amer: >60 mL/min (ref >60)
Glucose, Bld: 124 mg/dL — ABNORMAL HIGH (ref 65–99)
Potassium: 4.2 mmol/L (ref 3.5–5.1)
SODIUM: 145 mmol/L (ref 135–145)
Total Bilirubin: 0.3 mg/dL (ref 0.3–1.2)
Total Protein: 6.5 g/dL (ref 6.5–8.1)

## 2018-02-17 LAB — LIPASE, BLOOD: Lipase: 39 U/L (ref 11–51)

## 2018-02-17 MED ORDER — PANTOPRAZOLE SODIUM 20 MG PO TBEC: 20.0000 mg | DELAYED_RELEASE_TABLET | Freq: Every day | ORAL | 0 refills | Status: AC

## 2018-02-17 MED ORDER — SODIUM CHLORIDE 0.9 % IV BOLUS (SEPSIS)
500.0000 mL | Freq: Once | INTRAVENOUS | Status: AC
  Administered 2018-02-17: 500 mL via INTRAVENOUS

## 2018-02-17 MED ORDER — GI COCKTAIL ~~LOC~~
30.0000 mL | Freq: Once | ORAL | Status: AC
  Administered 2018-02-17: 30 mL via ORAL
  Filled 2018-02-17: qty 30

## 2018-02-17 MED ORDER — PANTOPRAZOLE SODIUM 40 MG IV SOLR
40.0000 mg | Freq: Once | INTRAVENOUS | Status: AC
  Administered 2018-02-17: 40 mg via INTRAVENOUS
  Filled 2018-02-17: qty 40

## 2018-02-17 MED ORDER — HYDROMORPHONE HCL 1 MG/ML IJ SOLN
1.0000 mg | Freq: Once | INTRAMUSCULAR | Status: AC
  Administered 2018-02-17: 1 mg via INTRAVENOUS
  Filled 2018-02-17: qty 1

## 2018-02-17 MED ORDER — MORPHINE SULFATE (PF) 4 MG/ML IV SOLN
4.0000 mg | Freq: Once | INTRAVENOUS | Status: AC
  Administered 2018-02-17: 4 mg via INTRAVENOUS
  Filled 2018-02-17: qty 1

## 2018-02-17 MED ORDER — IOPAMIDOL (ISOVUE-300) INJECTION 61%
INTRAVENOUS | Status: AC
  Administered 2018-02-17: 100 mL
  Filled 2018-02-17: qty 100

## 2018-02-17 MED ORDER — ONDANSETRON HCL 4 MG/2ML IJ SOLN
4.0000 mg | Freq: Once | INTRAMUSCULAR | Status: AC
  Administered 2018-02-17: 4 mg via INTRAVENOUS
  Filled 2018-02-17: qty 2

## 2018-02-17 NOTE — ED Notes (Signed)
Patient tolerating PO intake and states "I think I am ready to go home"

## 2018-02-17 NOTE — ED Provider Notes (Signed)
[facility] EMERGENCY DEPARTMENT Provider Note   CSN: [id number] Arrival date & time: 02/17/18  0327     History   Chief Complaint Chief Complaint  Patient presents with  . Abdominal Pain    HPI Annette Anderson is a 47 y.o. female with a hx of GERD, achalasia, ovarian cyst, HTN presents to the Emergency Department complaining of gradual, persistent, progressively worsening epigastric LUQ abd pain onset midnight after eating wings and other things.  Pt reports diarrhea over the last several weeks. Pt reports she is able to pass gas.  Pt reports similar pain with any PO intake over the last few weeks.  Pt reports nausea without vomiting.  Nothing makes it better and eating makes it worse.  Pt denies fever, chills, headache, neck pain, chest pain, SOB, vomiting  Pt reports hx of surgical stretching of esophagus and c-section. No hx of bowel obstruction.     The history is provided by the patient and medical records. No language interpreter was used.    Past Medical History:  Diagnosis Date  . Abdominal pain   . Achalasia   . Anemia   . Bilateral ovarian cysts   . GERD (gastroesophageal reflux disease)   . Hypertension   . Thyroid disease     Patient Active Problem List   Diagnosis Date Noted  . Achalasia   . Hypothyroidism 01/27/2014  . Abdominal pain 01/23/2014  . Anemia 01/23/2014  . GERD (gastroesophageal reflux disease) 01/23/2014    Past Surgical History:  Procedure Laterality Date  . CESAREAN SECTION    . TUBAL LIGATION      OB History    Gravida Para Term Preterm AB Living   3 3 3  0 0 3   SAB TAB Ectopic Multiple Live Births   0 0 0 0 3       Home Medications    Prior to Admission medications   Medication Sig Start Date End Date Taking? Authorizing Provider  calcium carbonate (TUMS EX) 750 MG chewable tablet Chew 2 tablets by mouth daily as needed for heartburn.   Yes [provider]  gabapentin (NEURONTIN) 600 MG tablet  Take 600 mg by mouth 3 (three) times daily.   Yes [provider]  losartan (COZAAR) 100 MG tablet Take 100 mg by mouth daily.   Yes [provider]  SYNTHROID 112 MCG tablet Take 112 mcg by mouth daily. 12/11/17  Yes [provider]  verapamil (CALAN) 120 MG tablet Take 120 mg by mouth 3 (three) times daily. 11/28/17  Yes [provider]  pantoprazole (PROTONIX) 20 MG tablet Take 1 tablet (20 mg total) by mouth daily. 02/17/18   Taejah Ohalloran, Dahlia Client, PA-C    Family History Family History  Problem Relation Age of Onset  . Hypertension Mother   . Hypertension Father   . Diabetes Father   . Thyroid disease Sister        radioactive ablation    Social History Social History   Tobacco Use  . Smoking status: Current Some Day Smoker    Packs/day: 0.10    Types: Cigarettes  . Smokeless tobacco: Never Used  Substance Use Topics  . Alcohol use: Yes    Comment: occasional   . Drug use: No     Allergies   Bee venom; Sulfa antibiotics; Naproxen; Nsaids; Doxycycline; and Latex   Review of Systems Review of Systems  Constitutional: Negative for appetite change, diaphoresis, fatigue, fever and unexpected weight change.  HENT:  Negative for mouth sores.   Eyes: Negative for visual disturbance.  Respiratory: Negative for cough, chest tightness, shortness of breath and wheezing.   Cardiovascular: Negative for chest pain.  Gastrointestinal: Positive for abdominal pain and nausea. Negative for constipation, diarrhea and vomiting.  Endocrine: Negative for polydipsia, polyphagia and polyuria.  Genitourinary: Negative for dysuria, frequency, hematuria and urgency.  Musculoskeletal: Negative for back pain and neck stiffness.  Skin: Negative for rash.  Allergic/Immunologic: Negative for immunocompromised state.  Neurological: Negative for syncope, light-headedness and headaches.  Hematological: Does not bruise/bleed easily.  Psychiatric/Behavioral: Negative  for sleep disturbance. The patient is not nervous/anxious.      Physical Exam Updated Vital Signs BP (!) 154/101 (BP Location: Right Arm)   Pulse (!) 101   Temp 98.4 F (36.9 C) (Oral)   Resp 20   SpO2 96%   Physical Exam  Constitutional: She appears well-developed and well-nourished. No distress.  Awake, alert, nontoxic appearance  HENT:  Head: Normocephalic and atraumatic.  Mouth/Throat: Oropharynx is clear and moist. No oropharyngeal exudate.  Eyes: Conjunctivae are normal. No scleral icterus.  Neck: Normal range of motion. Neck supple.  Cardiovascular: Normal rate, regular rhythm and intact distal pulses.  Pulmonary/Chest: Effort normal and breath sounds normal. No respiratory distress. She has no wheezes.  Equal chest expansion  Abdominal: Soft. Bowel sounds are normal. She exhibits no mass. There is tenderness in the epigastric area and left upper quadrant. There is rebound and guarding. There is no rigidity, no CVA tenderness, no tenderness at McBurney's point and negative Murphy's sign.  Musculoskeletal: Normal range of motion. She exhibits no edema.  Neurological: She is alert.  Speech is clear and goal oriented Moves extremities without ataxia  Skin: Skin is warm and dry. She is not diaphoretic.  Psychiatric: She has a normal mood and affect.  Nursing note and vitals reviewed.    ED Treatments / Results  Labs (all labs ordered are listed, but only abnormal results are displayed) Labs Reviewed  COMPREHENSIVE METABOLIC PANEL - Abnormal; Notable for the following components:      Result Value   Chloride 112 (*)    CO2 21 (*)    Glucose, Bld 124 (*)    Calcium 8.7 (*)    All other components within normal limits  URINALYSIS, ROUTINE W REFLEX MICROSCOPIC - Abnormal; Notable for the following components:   Specific Gravity, Urine 1.040 (*)    All other components within normal limits  LIPASE, BLOOD  CBC  I-STAT BETA HCG BLOOD, ED ([facility], [facility], [facility] ONLY)    EKG   EKG Interpretation  Date/Time:  Sunday February 17 2018 04:06:27 EST Ventricular Rate:  103 PR Interval:    QRS Duration: 88 QT Interval:  354 QTC Calculation: 464 R Axis:   11 Text Interpretation:  Sinus tachycardia Confirmed by Geoffery Lyons ([id number]) on 02/17/2018 4:16:34 AM       Radiology Ct Abdomen Pelvis W Contrast  Result Date: 02/17/2018 CLINICAL DATA:  47 year old female with nausea vomiting and abdominal pain. EXAM: CT ABDOMEN AND PELVIS WITH CONTRAST TECHNIQUE: Multidetector CT imaging of the abdomen and pelvis was performed using the standard protocol following bolus administration of intravenous contrast. CONTRAST:  ISOVUE-300 IOPAMIDOL (ISOVUE-300) INJECTION 61% COMPARISON:  Abdominal CT dated 02/11/2017 FINDINGS: Lower chest: The visualized lung bases are clear. No intra-abdominal free air or free fluid. Hepatobiliary: The liver is unremarkable. Faint content within the gallbladder may represent noncalcified stones or sludge. No pericholecystic fluid or evidence of acute  cholecystitis by CT. Pancreas: Unremarkable. No pancreatic ductal dilatation or surrounding inflammatory changes. Spleen: Normal in size without focal abnormality. Adrenals/Urinary Tract: Adrenal glands are unremarkable. Kidneys are normal, without renal calculi, focal lesion, or hydronephrosis. Bladder is unremarkable. Stomach/Bowel: There is a small hiatal hernia. Small amount of fluid within the distal esophagus may represent gastroesophageal reflux. There is moderate stool throughout the colon. Multiple small colonic diverticula without active inflammatory changes. No bowel obstruction or active inflammation The appendix is not visualized with certainty. No inflammatory changes identified in the right lower quadrant. Vascular/Lymphatic: No significant vascular findings are present. No enlarged abdominal or pelvic lymph nodes. Reproductive: The uterus and ovaries are grossly unremarkable. Other: None  Musculoskeletal: No acute or significant osseous findings. IMPRESSION: 1. Small hiatal hernia with findings suggestive of gastroesophageal reflux. 2. Multiple small colonic diverticula without active inflammatory changes. No bowel obstruction. 3. Probable gallbladder sludge or noncalcified stone. Electronically Signed   By: Elgie CollardArash  Radparvar M.D.   On: 02/17/2018 05:22   Dg Abd Acute W/chest  Result Date: 02/17/2018 CLINICAL DATA:  47 year old female with upper abdominal pain EXAM: DG ABDOMEN ACUTE W/ 1V CHEST COMPARISON:  Abdominal CT dated 02/17/2018 FINDINGS: The lungs are clear. There is no pleural effusion or pneumothorax. The cardiac silhouette is within normal limits. No bowel dilatation or evidence of obstruction. No free air or radiopaque calculi. The osseous structures and soft tissues are grossly unremarkable. IMPRESSION: Negative abdominal radiographs.  No acute cardiopulmonary disease. Electronically Signed   By: Elgie CollardArash  Radparvar M.D.   On: 02/17/2018 05:30    Procedures Procedures (including critical care time)  Medications Ordered in ED Medications  morphine 4 MG/ML injection 4 mg (4 mg Intravenous Given 02/17/18 0458)  gi cocktail (Maalox,Lidocaine,Donnatal) (30 mLs Oral Given 02/17/18 0519)  iopamidol (ISOVUE-300) 61 % injection (100 mLs  Contrast Given 02/17/18 0451)  HYDROmorphone (DILAUDID) injection 1 mg (1 mg Intravenous Given 02/17/18 0556)  ondansetron (ZOFRAN) injection 4 mg (4 mg Intravenous Given 02/17/18 0624)  pantoprazole (PROTONIX) injection 40 mg (40 mg Intravenous Given 02/17/18 [id number])     Initial Impression / Assessment and Plan / ED Course  I have reviewed the triage vital signs and the nursing notes.  Pertinent labs & imaging results that were available during my care of the patient were reviewed by me and considered in my medical decision making (see chart for details).  Clinical Course as of Feb 18 720  Wynelle LinkSun Feb 17, 2018  0628 Patient with some improvement in  pain.  She is tolerating ice chips at this time.  [HM]    Clinical Course User Index [HM] Avanna Sowder, Dahlia ClientHannah, [place]PA-C    Patient presents with significant abdominal pain after eating tonight.  History of reflux and hiatal hernia.  On exam she appears distressed.  Patient given pain medication.  Pain improved after several doses.  No emesis here in the emergency department.  CT scan without acute abnormality.  Some fluid in the esophagus consistent with GERD.  I personally reviewed these images.  No evidence of bowel obstruction.  No tenderness to palpation along the right upper quadrant.  Normal lipase.  Less likely pancreatitis or cholecystitis.  Labs are reassuring.  Repeat abdominal exam is soft without rebound or guarding.  7:24 AM At shift change care [facility] who will re-evaulate after PO trial.  Pt may be d/c home if her pain is controlled and she tolerates PO.     Final Clinical Impressions(s) / ED  Diagnoses   Final diagnoses:  Epigastric pain  Acute gastritis without hemorrhage, unspecified gastritis type    ED Discharge Orders        Ordered    pantoprazole (PROTONIX) 20 MG tablet  Daily     02/17/18 0719       Romari Gasparro, Boyd Kerbs 02/17/18 1610    Geoffery Lyons, MD 02/17/18 2255

## 2018-02-17 NOTE — Discharge Instructions (Signed)
1. Medications: Start Protonix, usual home medications; stop Prilosec 2. Treatment: rest, drink plenty of fluids, advance diet slowly 3. Follow Up: Please followup with your primary doctor and Gastroenterologist in 5-7 days for discussion of your diagnoses and further evaluation after today's visit; if you do not have a primary care doctor use the resource guide provided to find one; Please return to the ER for persistent vomiting, high fevers or worsening symptoms

## 2018-02-17 NOTE — ED Triage Notes (Signed)
Pt arrives via EMS from home reporting upper abd pain intermittently for the last week. Reports epigastric pain tonight after beer and some vegetables, about a half hour later pt began reporting epigastric pain. Also reporting vomiting and diarrhea. EMS EKG shows NSR. EMS gave 200mcg fentanyl and 4mg  zofran.

## 2018-02-17 NOTE — ED Provider Notes (Signed)
Assumed care from patient at shift change [facility].  Please see her note for full H&P.  Briefly patient presents for evaluation of abdominal pain that started after eating chicken wings.  She has had CT abdomen pelvis, along with pain medicine with morphine, Dilaudid, GI cocktail, she was given Zofran Protonix and saline.  Plan is to reevaluate patient, and continue to monitor her.  Give her more pain medicine as needed and p.o. challenge.  At discharge she will be switched to Protonix.  0 745 informed by RN that patient is requesting more pain medicine.  Orders given for additional morphine, and p.o. challenge once RN feels patient can tolerate it.  Evaluated patient, she has passed p.o. challenge and states she feels ready to go home.  She is resting comfortably in no obvious distress.  She is awake and alert respirations are nonlabored and even.  Return precautions and plan were discussed, and she states her understanding.    Annette Anderson, Annette Anderson, [place]PA-C 02/17/18 [id number]    Annette Anderson, [place], MD 02/17/18 2255

## 2018-03-25 ENCOUNTER — Emergency Department (HOSPITAL_COMMUNITY): Payer: BLUE CROSS/BLUE SHIELD

## 2018-03-25 ENCOUNTER — Emergency Department (HOSPITAL_COMMUNITY)
Admission: EM | Admit: 2018-03-25 | Discharge: 2018-03-25 | Disposition: A | Discharge status: Home / Self Care | Payer: BLUE CROSS/BLUE SHIELD | Attending: Emergency Medicine | Admitting: Emergency Medicine

## 2018-03-25 ENCOUNTER — Encounter (HOSPITAL_COMMUNITY): Payer: Self-pay | Admitting: Emergency Medicine

## 2018-03-25 DIAGNOSIS — Z91040 Latex allergy status: Secondary | ICD-10-CM | POA: Diagnosis not present

## 2018-03-25 DIAGNOSIS — Y9389 Activity, other specified: Secondary | ICD-10-CM | POA: Diagnosis not present

## 2018-03-25 DIAGNOSIS — X509XXA Other and unspecified overexertion or strenuous movements or postures, initial encounter: Secondary | ICD-10-CM | POA: Diagnosis not present

## 2018-03-25 DIAGNOSIS — M25562 Pain in left knee: Secondary | ICD-10-CM | POA: Insufficient documentation

## 2018-03-25 DIAGNOSIS — F17210 Nicotine dependence, cigarettes, uncomplicated: Secondary | ICD-10-CM | POA: Diagnosis not present

## 2018-03-25 DIAGNOSIS — I10 Essential (primary) hypertension: Secondary | ICD-10-CM | POA: Diagnosis not present

## 2018-03-25 DIAGNOSIS — Y929 Unspecified place or not applicable: Secondary | ICD-10-CM | POA: Insufficient documentation

## 2018-03-25 DIAGNOSIS — E039 Hypothyroidism, unspecified: Secondary | ICD-10-CM | POA: Insufficient documentation

## 2018-03-25 DIAGNOSIS — Z79899 Other long term (current) drug therapy: Secondary | ICD-10-CM | POA: Diagnosis not present

## 2018-03-25 DIAGNOSIS — Y999 Unspecified external cause status: Secondary | ICD-10-CM | POA: Diagnosis not present

## 2018-03-25 MED ORDER — OXYCODONE-ACETAMINOPHEN 5-325 MG PO TABS
1.0000 | ORAL_TABLET | Freq: Once | ORAL | Status: AC
  Administered 2018-03-25: 1 via ORAL
  Filled 2018-03-25: qty 1

## 2018-03-25 NOTE — ED Provider Notes (Signed)
[facility]-EMERGENCY DEPT Provider Note   CSN: [id number] Arrival date & time: 03/25/18  1418     History   Chief Complaint Chief Complaint  Patient presents with  . Knee Pain    HPI Jonita AlbeeBridget Hagger is a 47 y.o. female.  HPI  [name] is a 47 year old female with a history of hypertension and hypothyroidism who presents to the emergency department for evaluation of left knee pain.  Patient reports that she was chasing her grandchild two days ago and quickly changed direction.  States she heard a "popping" noise in her knee and had immediate pain following.  At this time she states her pain is 7/10 in severity and "feels like a toothache."  Pain is worse with weightbearing and knee flexion.  She has tried elevation, ice, Tylenol without significant improvement in her pain.  States that the left knee appears somewhat swollen compared to right.  She denies fevers, chills, numbness, weakness, open wound, pain elsewhere.  She is established with [facility] and has an upcoming appointment.   Past Medical History:  Diagnosis Date  . Abdominal pain   . Achalasia   . Anemia   . Bilateral ovarian cysts   . GERD (gastroesophageal reflux disease)   . Hypertension   . Thyroid disease     Patient Active Problem List   Diagnosis Date Noted  . Achalasia   . Hypothyroidism 01/27/2014  . Abdominal pain 01/23/2014  . Anemia 01/23/2014  . GERD (gastroesophageal reflux disease) 01/23/2014    Past Surgical History:  Procedure Laterality Date  . CESAREAN SECTION    . TUBAL LIGATION       OB History    Gravida  3   Para  3   Term  3   Preterm  0   AB  0   Living  3     SAB  0   TAB  0   Ectopic  0   Multiple  0   Live Births  3            Home Medications    Prior to Admission medications   Medication Sig Start Date End Date Taking? Authorizing Provider  calcium carbonate (TUMS EX) 750 MG chewable tablet Chew 2 tablets by  mouth daily as needed for heartburn.    [provider]  gabapentin (NEURONTIN) 600 MG tablet Take 600 mg by mouth 3 (three) times daily.    [provider]  losartan (COZAAR) 100 MG tablet Take 100 mg by mouth daily.    [provider]  pantoprazole (PROTONIX) 20 MG tablet Take 1 tablet (20 mg total) by mouth daily. 02/17/18   Muthersbaugh, Dahlia ClientHannah, PA-C  SYNTHROID 112 MCG tablet Take 112 mcg by mouth daily. 12/11/17   [provider]  verapamil (CALAN) 120 MG tablet Take 120 mg by mouth 3 (three) times daily. 11/28/17   [provider]    Family History Family History  Problem Relation Age of Onset  . Hypertension Mother   . Hypertension Father   . Diabetes Father   . Thyroid disease Sister        radioactive ablation    Social History Social History   Tobacco Use  . Smoking status: Current Some Day Smoker    Packs/day: 0.10    Types: Cigarettes  . Smokeless tobacco: Never Used  Substance Use Topics  . Alcohol use: Yes    Comment: occasional   . Drug use: No  Allergies   Bee venom; Sulfa antibiotics; Naproxen; Nsaids; Doxycycline; and Latex   Review of Systems Review of Systems  Constitutional: Negative for chills and fever.  Musculoskeletal: Positive for arthralgias (left knee), gait problem (pain with weight bearing) and joint swelling (right knee).  Skin: Negative for color change and wound.  Neurological: Negative for weakness and numbness.     Physical Exam Updated Vital Signs BP (!) 125/93 (BP Location: Left Arm)   Pulse 78   Temp 98.7 F (37.1 C) (Oral)   Resp 16   SpO2 100%   Physical Exam  Constitutional: She is oriented to person, place, and time. She appears well-developed and well-nourished. No distress.  HENT:  Head: Normocephalic and atraumatic.  Eyes: Right eye exhibits no discharge. Left eye exhibits no discharge.  Pulmonary/Chest: Effort normal. No respiratory distress.  Musculoskeletal:    Left knee with tenderness to palpation of joint line. Full active flexion and extension, although painful with knee flexion. Left knee appears somewhat swollen compared to right. No overlying erythema, warmth, ecchymosis or break in skin. No abnormal alignment or patellar mobility. No varus/valgus laxity. No laxity with anterior/posterior drawer, although pain reproduced with posterior drawer. Negative McMurray's.  No crepitus. 2+ DP pulses bilaterally. All compartments are soft. Sensation to light intact distal to injury.  Neurological: She is alert and oriented to person, place, and time. Coordination normal.  Skin: Skin is warm and dry. Capillary refill takes less than 2 seconds. She is not diaphoretic.  Psychiatric: She has a normal mood and affect. Her behavior is normal.  Nursing note and vitals reviewed.    ED Treatments / Results  Labs (all labs ordered are listed, but only abnormal results are displayed) Labs Reviewed - No data to display  EKG None  Radiology Dg Knee Complete 4 Views Left  Result Date: 03/25/2018 CLINICAL DATA:  Left-sided knee pain following twisting injury 2 days ago, initial encounter EXAM: LEFT KNEE - COMPLETE 4+ VIEW COMPARISON:  None. FINDINGS: No evidence of fracture, dislocation, or joint effusion. No evidence of arthropathy or other focal bone abnormality. Soft tissues are unremarkable. IMPRESSION: No acute abnormality noted. Electronically Signed   By: Alcide Clever M.D.   On: 03/25/2018 15:33    Procedures Procedures (including critical care time)  Medications Ordered in ED Medications  oxyCODONE-acetaminophen (PERCOCET/ROXICET) 5-325 MG per tablet 1 tablet (1 tablet Oral Given 03/25/18 1757)     Initial Impression / Assessment and Plan / ED Course  I have reviewed the triage vital signs and the nursing notes.  Pertinent labs & imaging results that were available during my care of the patient were reviewed by me and considered in my medical  decision making (see chart for details).    X-ray without acute fracture or abnormality.  Left leg is neurovascularly intact.  No overlying erythema or warmth to suggest infection.  She does have pain with posterior drawer test, although no posterior drawer laxity.  Patient has been counseled to follow-up with her orthopedic doctor whom she is already established for follow-up for potential ligamentous injury.  Patient placed in knee immobilizer and given crutches to use until she can be seen by orthopedics. Have discussed Tylenol, rest, ice and elevation.  Patient agrees and voiced understanding to the above plan and has no complaints prior to discharge.  Final Clinical Impressions(s) / ED Diagnoses   Final diagnoses:  Acute pain of left knee    ED Discharge Orders    None  Kellie Shropshire, PA-C 03/25/18 1800    Wynetta Fines, MD 03/26/18 2326

## 2018-03-25 NOTE — Discharge Instructions (Addendum)
Please schedule an appointment for follow-up with the orthopedic doctor.  Take Tylenol for pain.  Continue to ice knee and elevate it when you can.  You can use crutches and knee brace for support.  Return to the emergency department if you have any new or concerning symptoms.

## 2018-03-25 NOTE — ED Notes (Signed)
Ortho tech called for knee immobilizer and crutches 

## 2018-03-25 NOTE — ED Triage Notes (Signed)
Patient c/o left knee pain after twisting it x2 days ago. Reports trying ice, heat, and tylenol with no relief. Ambulatory.

## 2018-05-09 ENCOUNTER — Other Ambulatory Visit (HOSPITAL_COMMUNITY): Payer: Self-pay | Admitting: Orthopedic Surgery

## 2018-05-09 DIAGNOSIS — M879 Osteonecrosis, unspecified: Secondary | ICD-10-CM

## 2018-05-22 ENCOUNTER — Encounter (HOSPITAL_COMMUNITY): Payer: BLUE CROSS/BLUE SHIELD

## 2018-05-28 ENCOUNTER — Encounter (HOSPITAL_COMMUNITY): Payer: Self-pay

## 2018-05-28 ENCOUNTER — Encounter (HOSPITAL_COMMUNITY): Payer: BLUE CROSS/BLUE SHIELD

## 2018-05-28 ENCOUNTER — Encounter (HOSPITAL_COMMUNITY)
Admission: RE | Admit: 2018-05-28 | Discharge: 2018-05-28 | Disposition: A | Discharge status: Home / Self Care | Payer: BLUE CROSS/BLUE SHIELD | Source: Ambulatory Visit | Attending: Orthopedic Surgery | Admitting: Orthopedic Surgery

## 2018-05-28 DIAGNOSIS — M879 Osteonecrosis, unspecified: Secondary | ICD-10-CM | POA: Insufficient documentation

## 2018-05-28 MED ORDER — TECHNETIUM TC 99M MEDRONATE IV KIT
21.3000 | PACK | Freq: Once | INTRAVENOUS | Status: AC | PRN
  Administered 2018-05-28: 21.3 via INTRAVENOUS

## 2018-05-31 ENCOUNTER — Encounter (HOSPITAL_COMMUNITY)
Admission: RE | Admit: 2018-05-31 | Discharge: 2018-05-31 | Disposition: A | Discharge status: Home / Self Care | Payer: BLUE CROSS/BLUE SHIELD | Source: Ambulatory Visit | Attending: Orthopedic Surgery | Admitting: Orthopedic Surgery

## 2018-05-31 DIAGNOSIS — M879 Osteonecrosis, unspecified: Secondary | ICD-10-CM | POA: Insufficient documentation

## 2018-05-31 MED ORDER — TECHNETIUM TC 99M MEDRONATE IV KIT
20.0000 | PACK | Freq: Once | INTRAVENOUS | Status: AC | PRN
  Administered 2018-05-31: 20 via INTRAVENOUS

## 2019-01-16 ENCOUNTER — Ambulatory Visit: Payer: Self-pay | Admitting: Nurse Practitioner

## 2019-01-16 DIAGNOSIS — Z0289 Encounter for other administrative examinations: Secondary | ICD-10-CM

## 2019-02-12 ENCOUNTER — Encounter: Payer: Self-pay | Admitting: Nurse Practitioner

## 2019-02-14 ENCOUNTER — Encounter (HOSPITAL_COMMUNITY): Payer: Self-pay | Admitting: Emergency Medicine

## 2019-02-14 ENCOUNTER — Other Ambulatory Visit: Payer: Self-pay

## 2019-02-14 ENCOUNTER — Emergency Department (HOSPITAL_COMMUNITY)
Admission: EM | Admit: 2019-02-14 | Discharge: 2019-02-14 | Disposition: A | Discharge status: Home / Self Care | Payer: BLUE CROSS/BLUE SHIELD | Attending: Emergency Medicine | Admitting: Emergency Medicine

## 2019-02-14 DIAGNOSIS — Z79899 Other long term (current) drug therapy: Secondary | ICD-10-CM | POA: Diagnosis not present

## 2019-02-14 DIAGNOSIS — Z91040 Latex allergy status: Secondary | ICD-10-CM | POA: Insufficient documentation

## 2019-02-14 DIAGNOSIS — M545 Low back pain, unspecified: Secondary | ICD-10-CM

## 2019-02-14 DIAGNOSIS — F17210 Nicotine dependence, cigarettes, uncomplicated: Secondary | ICD-10-CM | POA: Diagnosis not present

## 2019-02-14 DIAGNOSIS — I10 Essential (primary) hypertension: Secondary | ICD-10-CM | POA: Diagnosis not present

## 2019-02-14 MED ORDER — HYDROMORPHONE HCL 1 MG/ML IJ SOLN
2.0000 mg | Freq: Once | INTRAMUSCULAR | Status: AC
  Administered 2019-02-14: 2 mg via INTRAMUSCULAR
  Filled 2019-02-14: qty 2

## 2019-02-14 MED ORDER — HYDROCODONE-ACETAMINOPHEN 5-325 MG PO TABS: 1.0000 | ORAL_TABLET | ORAL | 0 refills | Status: AC | PRN

## 2019-02-14 NOTE — ED Triage Notes (Signed)
Pt reports complete back pain and bilateral hip pain that started 2 weeks ago. Pt denies any injury but did have a fall a month ago. 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

## 2019-02-14 NOTE — ED Provider Notes (Signed)
[facility] EMERGENCY DEPARTMENT Provider Note   CSN: [id number] Arrival date & time: 02/14/19  1716    History   Chief Complaint Chief Complaint  Patient presents with  . Hip Pain  . Back Pain    HPI Annette Anderson is a 48 y.o. female.     The history is provided by the patient.  Back Pain  Location:  Lumbar spine Quality:  Shooting, stiffness and stabbing Stiffness is present:  In the morning Radiates to: radiates to bilateral hips. 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  no DM   Past Medical History:  Diagnosis Date  . Abdominal pain   . Achalasia   . Anemia   . Bilateral ovarian cysts   . GERD (gastroesophageal reflux disease)   . Hypertension   . Thyroid disease     Patient Active Problem List   Diagnosis Date Noted  . Achalasia   . Hypothyroidism 01/27/2014  . Abdominal pain 01/23/2014  . Anemia 01/23/2014  . GERD (gastroesophageal reflux disease) 01/23/2014    Past Surgical History:  Procedure Laterality Date  . CESAREAN SECTION    . TUBAL LIGATION       OB History    Gravida  3   Para  3   Term  3   Preterm  0   AB  0   Living  3     SAB  0   TAB  0   Ectopic  0   Multiple  0   Live  Births  3            Home Medications    Prior to Admission medications   Medication Sig Start Date End Date Taking? Authorizing Provider  calcium carbonate (TUMS EX) 750 MG chewable tablet Chew 2 tablets by mouth daily as needed for heartburn.    [provider]  gabapentin (NEURONTIN) 600 MG tablet Take 600 mg by mouth 3 (three) times daily.    [provider]  losartan (COZAAR) 100 MG tablet Take 100 mg by mouth daily.    [provider]  pantoprazole (PROTONIX) 20 MG tablet Take 1 tablet (20 mg total) by mouth daily. 02/17/18   Muthersbaugh, Dahlia Client, PA-C  SYNTHROID 112 MCG tablet Take 112 mcg by mouth daily. 12/11/17   [provider]  verapamil (CALAN) 120 MG tablet Take 120 mg by mouth 3 (three) times daily. 11/28/17   [provider]    Family History Family History  Problem Relation Age of Onset  . Hypertension Mother   . Hypertension Father   . Diabetes Father   . Thyroid disease Sister        radioactive ablation    Social History Social History   Tobacco Use  .  Smoking status: Current Some Day Smoker    Packs/day: 0.10    Types: Cigarettes  . Smokeless tobacco: Never Used  Substance Use Topics  . Alcohol use: Yes    Comment: occasional   . Drug use: No     Allergies   Bee venom; Sulfa antibiotics; Naproxen; Nsaids; Doxycycline; and Latex   Review of Systems Review of Systems  Constitutional: Negative for fever.  Gastrointestinal: Negative for abdominal pain and bowel incontinence.  Genitourinary: Negative for bladder incontinence and dysuria.  Musculoskeletal: Positive for back pain.  Neurological: Negative for tingling, weakness, numbness and paresthesias.  All other systems reviewed and are negative.    Physical Exam Updated Vital Signs BP (!) 135/97 (BP Location: Right Arm)   Pulse 81   Temp 98.1 F (36.7 C) (Oral)   Resp 16   Ht 5\' 2"  (1.575 m)   Wt 77.1 kg   SpO2 100%   BMI 31.09 kg/m    Physical Exam Vitals signs and nursing note reviewed.  Constitutional:      General: She is not in acute distress.    Appearance: Normal appearance.  HENT:     Head: Normocephalic.  Cardiovascular:     Rate and Rhythm: Normal rate.     Pulses: Normal pulses.  Pulmonary:     Effort: Pulmonary effort is normal.  Musculoskeletal:        General: Tenderness present.     Lumbar back: She exhibits decreased range of motion, tenderness, bony tenderness and spasm. She exhibits normal pulse.       Back:     Right lower leg: No edema.     Left lower leg: No edema.  Skin:    General: Skin is warm.     Capillary Refill: Capillary refill takes less than 2 seconds.  Neurological:     General: No focal deficit present.     Mental Status: She is alert. Mental status is at baseline.     Sensory: No sensory deficit.     Motor: No weakness.     Gait: Gait normal.  Psychiatric:        Mood and Affect: Mood normal.        Behavior: Behavior normal.      ED Treatments / Results  Labs (all labs ordered are listed, but only abnormal results are displayed) Labs Reviewed - No data to display  EKG None  Radiology No results found.  Procedures Procedures (including critical care time)  Medications Ordered in ED Medications  HYDROmorphone (DILAUDID) injection 2 mg (has no administration in time range)     Initial Impression / Assessment and Plan / ED Course  I have reviewed the triage vital signs and the nursing notes.  Pertinent labs & imaging results that were available during my care of the patient were reviewed by me and considered in my medical decision making (see chart for details).       Pt with gradual onset of back pain suggestive of DDD and MSK pain.  No sx going down the legs, numbness or weakness.  No neurovascular compromise and no incontinence.  Pt has no infectious sx, hx of CA  or other red flags concerning for pathologic back pain.  No IVDU, recent back  injections or hospitalizations.  Pt is able to ambulate but is painful.  Normal strength and reflexes on exam.  Denies trauma.   She is unable to take prednisone or NSAIDs due to stomach issues and allergy.  She  has been using robaxin, tylenol, lidocaine patches and muscle rubs but unable to see Dr. Darrelyn Hillock till next week.  No symptoms today suggesting that patient needs emergent MRI. Will give pt pain control and to return for developement of above sx.   Final Clinical Impressions(s) / ED Diagnoses   Final diagnoses:  None    ED Discharge Orders    None       Gwyneth Sprout, MD 02/17/19 1539

## 2019-02-14 NOTE — ED Notes (Signed)
Patient verbalizes understanding of discharge instructions. Opportunity for questioning and answers were provided. Armband removed by staff, pt discharged from ED home via POV.  

## 2019-02-14 NOTE — Discharge Instructions (Signed)
Return if you develop weakness in the legs or trouble using the bathroom

## 2019-05-15 ENCOUNTER — Other Ambulatory Visit: Payer: Self-pay

## 2019-05-15 ENCOUNTER — Emergency Department (HOSPITAL_COMMUNITY)
Admission: EM | Admit: 2019-05-15 | Discharge: 2019-05-16 | Disposition: A | Discharge status: Home / Self Care | Payer: BLUE CROSS/BLUE SHIELD | Attending: Emergency Medicine | Admitting: Emergency Medicine

## 2019-05-15 ENCOUNTER — Emergency Department (HOSPITAL_COMMUNITY): Payer: BLUE CROSS/BLUE SHIELD

## 2019-05-15 ENCOUNTER — Encounter (HOSPITAL_COMMUNITY): Payer: Self-pay | Admitting: Emergency Medicine

## 2019-05-15 DIAGNOSIS — S299XXA Unspecified injury of thorax, initial encounter: Secondary | ICD-10-CM | POA: Diagnosis present

## 2019-05-15 DIAGNOSIS — S2020XA Contusion of thorax, unspecified, initial encounter: Secondary | ICD-10-CM

## 2019-05-15 DIAGNOSIS — E039 Hypothyroidism, unspecified: Secondary | ICD-10-CM | POA: Diagnosis not present

## 2019-05-15 DIAGNOSIS — F17210 Nicotine dependence, cigarettes, uncomplicated: Secondary | ICD-10-CM | POA: Insufficient documentation

## 2019-05-15 DIAGNOSIS — Z79899 Other long term (current) drug therapy: Secondary | ICD-10-CM | POA: Diagnosis not present

## 2019-05-15 DIAGNOSIS — Y999 Unspecified external cause status: Secondary | ICD-10-CM | POA: Insufficient documentation

## 2019-05-15 DIAGNOSIS — Y9389 Activity, other specified: Secondary | ICD-10-CM | POA: Insufficient documentation

## 2019-05-15 DIAGNOSIS — R51 Headache: Secondary | ICD-10-CM | POA: Diagnosis not present

## 2019-05-15 DIAGNOSIS — R1011 Right upper quadrant pain: Secondary | ICD-10-CM | POA: Diagnosis not present

## 2019-05-15 DIAGNOSIS — Z91040 Latex allergy status: Secondary | ICD-10-CM | POA: Insufficient documentation

## 2019-05-15 DIAGNOSIS — R110 Nausea: Secondary | ICD-10-CM | POA: Insufficient documentation

## 2019-05-15 DIAGNOSIS — I10 Essential (primary) hypertension: Secondary | ICD-10-CM | POA: Diagnosis not present

## 2019-05-15 DIAGNOSIS — Y9289 Other specified places as the place of occurrence of the external cause: Secondary | ICD-10-CM | POA: Diagnosis not present

## 2019-05-15 MED ORDER — ACETAMINOPHEN 500 MG PO TABS
1000.0000 mg | ORAL_TABLET | Freq: Once | ORAL | Status: AC
  Administered 2019-05-16: 01:00:00 1000 mg via ORAL
  Filled 2019-05-15: qty 2

## 2019-05-15 MED ORDER — METHOCARBAMOL 1000 MG/10ML IJ SOLN: 1000.0000 mg | Freq: Once | INTRAMUSCULAR | Status: DC

## 2019-05-15 MED ORDER — LIDOCAINE 5 % EX PTCH
1.0000 | MEDICATED_PATCH | CUTANEOUS | Status: DC
  Administered 2019-05-16: 01:00:00 1 via TRANSDERMAL
  Filled 2019-05-15: qty 1
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
## 2019-05-15 NOTE — ED Notes (Signed)
Bed: WTR5 Expected date:  Expected time:  Means of arrival:  Comments: 

## 2019-05-16 ENCOUNTER — Encounter (HOSPITAL_COMMUNITY): Payer: Self-pay

## 2019-05-16 ENCOUNTER — Emergency Department (HOSPITAL_COMMUNITY): Payer: BLUE CROSS/BLUE SHIELD

## 2019-05-16 LAB — CBC WITH DIFFERENTIAL/PLATELET
Abs Immature Granulocytes: 0.04 10*3/uL (ref 0.00–0.07)
Basophils Absolute: 0.1 10*3/uL (ref 0.0–0.1)
Basophils Relative: 1 %
Eosinophils Absolute: 0.4 10*3/uL (ref 0.0–0.5)
Eosinophils Relative: 4 %
HCT: 41.9 % (ref 36.0–46.0)
Hemoglobin: 13.7 g/dL (ref 12.0–15.0)
Immature Granulocytes: 0 %
Lymphocytes Relative: 36 %
Lymphs Abs: 3.7 10*3/uL (ref 0.7–4.0)
MCH: 32.2 pg (ref 26.0–34.0)
MCHC: 32.7 g/dL (ref 30.0–36.0)
MCV: 98.4 fL (ref 80.0–100.0)
Monocytes Absolute: 0.6 10*3/uL (ref 0.1–1.0)
Monocytes Relative: 6 %
Neutro Abs: 5.4 10*3/uL (ref 1.7–7.7)
Neutrophils Relative %: 53 %
Platelets: 312 10*3/uL (ref 150–400)
RBC: 4.26 MIL/uL (ref 3.87–5.11)
RDW: 13.1 % (ref 11.5–15.5)
WBC: 10.2 10*3/uL (ref 4.0–10.5)
nRBC: 0 % (ref 0.0–0.2)

## 2019-05-16 LAB — COMPREHENSIVE METABOLIC PANEL
ALT: 30 U/L (ref 0–44)
AST: 27 U/L (ref 15–41)
Albumin: 4.1 g/dL (ref 3.5–5.0)
Alkaline Phosphatase: 81 U/L (ref 38–126)
Anion gap: 13 (ref 5–15)
BUN: 8 mg/dL (ref 6–20)
CO2: 24 mmol/L (ref 22–32)
Calcium: 9.6 mg/dL (ref 8.9–10.3)
Chloride: 106 mmol/L (ref 98–111)
Creatinine, Ser: 0.77 mg/dL (ref 0.44–1.00)
GFR calc Af Amer: >60 mL/min (ref >60)
GFR calc non Af Amer: >60 mL/min (ref >60)
Glucose, Bld: 95 mg/dL (ref 70–99)
Potassium: 3.5 mmol/L (ref 3.5–5.1)
Sodium: 143 mmol/L (ref 135–145)
Total Bilirubin: 0.1 mg/dL — ABNORMAL LOW (ref 0.3–1.2)
Total Protein: 7.7 g/dL (ref 6.5–8.1)

## 2019-05-16 LAB — I-STAT BETA HCG BLOOD, ED (MC, WL, AP ONLY): I-stat hCG, quantitative: <5 m[IU]/mL (ref <5)

## 2019-05-16 MED ORDER — IOHEXOL 300 MG/ML  SOLN
100.0000 mL | Freq: Once | INTRAMUSCULAR | Status: AC | PRN
  Administered 2019-05-16: 02:00:00 100 mL via INTRAVENOUS

## 2019-05-16 MED ORDER — HYDROCODONE-ACETAMINOPHEN 5-325 MG PO TABS
1.0000 | ORAL_TABLET | Freq: Once | ORAL | Status: AC
  Administered 2019-05-16: 13:00:00 1 via ORAL
  Filled 2019-05-16: qty 1

## 2019-05-16 MED ORDER — LIDOCAINE 5 % EX PTCH: 1.0000 | MEDICATED_PATCH | CUTANEOUS | 0 refills | Status: AC

## 2019-05-16 MED ORDER — SODIUM CHLORIDE (PF) 0.9 % IJ SOLN
INTRAMUSCULAR | Status: AC
  Filled 2019-05-16: qty 50

## 2019-05-16 MED ORDER — FENTANYL CITRATE (PF) 100 MCG/2ML IJ SOLN
50.0000 ug | Freq: Once | INTRAMUSCULAR | Status: AC
  Administered 2019-05-16: 50 ug via INTRAVENOUS
  Filled 2019-05-16: qty 2

## 2019-05-16 MED ORDER — METHOCARBAMOL 500 MG PO TABS: 500.0000 mg | ORAL_TABLET | Freq: Two times a day (BID) | ORAL | 0 refills | Status: AC

## 2019-05-16 MED ORDER — SODIUM CHLORIDE 0.9 % IV BOLUS
1000.0000 mL | Freq: Once | INTRAVENOUS | Status: AC
  Administered 2019-05-16: 02:00:00 1000 mL via INTRAVENOUS

## 2019-05-16 MED ORDER — METHOCARBAMOL 1000 MG/10ML IJ SOLN
1000.0000 mg | Freq: Once | INTRAVENOUS | Status: AC
  Administered 2019-05-16: 01:00:00 1000 mg via INTRAVENOUS
  Filled 2019-05-16: qty 10
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
## 2019-05-16 NOTE — ED Notes (Signed)
Patient given breakfast tray. Patient also given ginger ale for nausea. Also given heat pack for stomach.

## 2019-05-16 NOTE — SANE Note (Signed)
On 05/16/2019, at approximately 1345 hours, the SANE/FNE Teacher, music) consult was completed. The primary RN and physician were notified. Please contact the SANE/FNE nurse on call (listed in Amion) with any further concerns.
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
## 2019-05-16 NOTE — ED Notes (Signed)
SANE nurse at bedside.

## 2019-05-16 NOTE — ED Notes (Signed)
Pt in bed. IV placed, labs sent. Ice applied to abdomen, back and face. Pt placed in modified trendelenburg per provider order. Meds given per MAR. Provider notified of swelling and bruising in left abdomen. Provider to follow up. Pt given call bell and told to inform us if anything changes. Awaiting CT.

## 2019-05-16 NOTE — ED Notes (Signed)
Pt is going to sleep until the am and try to call her sister again, patient was given the number to the [facility] as a resource.
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
## 2019-05-16 NOTE — ED Notes (Signed)
Pt ambulatory to bathroom without assist
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
## 2019-05-16 NOTE — Discharge Instructions (Signed)
° ° ° ° °  Interpersonal Violence   Interpersonal Violence aka Domestic Violence is defined as violence between people who have had a personal relationship. For example, someone you have ever dated, been married to or in a domestic partnership with. Someone with whom you have a child in common, or a current  household member.  Does one or more of the following   attempts to cause bodily injury, or intentionally causes bodily injury;  places you or a member of your family or household in fear of imminent serious bodily injury;  continued harassment that rises to such a level as to inflict substantial emotional distress; or  commits any rape or sexual offense  You are not alone. Unfortunately domestic violence is very common. Domestic violence does not go away on its own and tends to get worse over time and more frequent. There are people who can help. There are resources included in these instructions. Evidence can be collected in case you want to notify law enforcement now or in the future. A forensic nurse can take photographs and create a medical/legal document of the incident. If you choose to report to law enforcement, they will request a copy of the chart which we can provide with your permission. We can call in social work or an advocate to help with safety planning and emergency placement in a shelter if you have no other safe options.  THE POLICE CAN HELP YOU:   Get to a safe place away from the violence.   Get information on how the court can help protect you against the violence.   Get necessary belongings from your home for you and your children.   Get copies of police reports about the violence.   File a complaint in criminal court.   Find where local criminal and family courts are located.   The [facility] Can Help You  Safety Planning  Assistance with Shelter  Obtaining a Engineer, maintenance (IT) (50B)  Research officer, political party  Support Group  Soil scientist with domestic violence related criminal charges  Child Chartered loss adjuster Assistance Enrollment  Job Readiness  Budget Counseling   Coaching and Mentoring  Call your local domestic violence program for additional information and support.   [facility]   336-641-SAFE Crisis Line [phone] [facility]   [phone] Crisis Line [phone] [facility] [place] [phone]  National Domestic Violence Abuse Hotline  218 257 3327   [facility] DEPARTMENT CASE NUMBER:  [id number]  COMMUNICATIONS:  [id number] (x 3)

## 2019-05-25 NOTE — SANE Note (Signed)
Follow-up Phone Call  Patient gives verbal consent for a FNE/SANE follow-up phone call in 48-72 hours: DID NOT ASK THE PT. Patient's telephone number: [phone] (PT'S CELL W/ VM & TEXTING). Patient gives verbal consent to leave voicemail at the phone number listed above: DID NOT ASK THE PT. DO NOT CALL between the hours of: N/A  PT'S ADDRESS (SHE SHARES WITH THE SUBJECT THAT WAS CURRENTLY IN JAIL):  [address], [place], [place] [address].   POLICE DEPARTMENT CASE NUMBER:  [id number] OFFICER:  K. PROFFITT

## 2019-09-28 ENCOUNTER — Emergency Department (HOSPITAL_COMMUNITY)
Admission: EM | Admit: 2019-09-28 | Discharge: 2019-09-28 | Discharge status: Against Medical Advice | Payer: BLUE CROSS/BLUE SHIELD

## 2019-09-28 NOTE — ED Triage Notes (Signed)
Pt did not respond to name
